# Patient Record
Sex: Male | Born: 1955 | Race: White | Hispanic: No | Marital: Married | Smoking: Former smoker
Health system: Southern US, Community
[De-identification: ages and names within clinical notes are randomized; demographics above are authoritative.]

## PROBLEM LIST (undated history)

## (undated) DIAGNOSIS — R519 Headache, unspecified: Secondary | ICD-10-CM

## (undated) DIAGNOSIS — I2510 Atherosclerotic heart disease of native coronary artery without angina pectoris: Secondary | ICD-10-CM

## (undated) DIAGNOSIS — Z6834 Body mass index (BMI) 34.0-34.9, adult: Secondary | ICD-10-CM

## (undated) DIAGNOSIS — E785 Hyperlipidemia, unspecified: Secondary | ICD-10-CM

## (undated) DIAGNOSIS — I10 Essential (primary) hypertension: Secondary | ICD-10-CM

## (undated) DIAGNOSIS — M1990 Unspecified osteoarthritis, unspecified site: Secondary | ICD-10-CM

## (undated) HISTORY — DX: Hyperlipidemia, unspecified: E78.5

## (undated) HISTORY — DX: Essential (primary) hypertension: I10

## (undated) HISTORY — DX: Headache, unspecified: R51.9

## (undated) HISTORY — PX: OTHER SURGICAL HISTORY: SHX169

---

## 1999-11-15 ENCOUNTER — Encounter: Payer: Self-pay | Admitting: Family Medicine

## 1999-11-15 ENCOUNTER — Encounter: Admission: RE | Admit: 1999-11-15 | Discharge: 1999-11-15 | Payer: Self-pay | Admitting: Family Medicine

## 1999-12-20 ENCOUNTER — Encounter: Admission: RE | Admit: 1999-12-20 | Discharge: 1999-12-20 | Payer: Self-pay | Admitting: Family Medicine

## 1999-12-20 ENCOUNTER — Encounter: Payer: Self-pay | Admitting: Family Medicine

## 2000-07-24 ENCOUNTER — Encounter: Payer: Self-pay | Admitting: Specialist

## 2000-07-24 ENCOUNTER — Ambulatory Visit (HOSPITAL_COMMUNITY): Admission: RE | Admit: 2000-07-24 | Discharge: 2000-07-24 | Payer: Self-pay | Admitting: Specialist

## 2006-09-10 HISTORY — PX: OTHER SURGICAL HISTORY: SHX169

## 2007-07-17 ENCOUNTER — Inpatient Hospital Stay (HOSPITAL_COMMUNITY): Admission: EM | Admit: 2007-07-17 | Discharge: 2007-07-22 | Payer: Self-pay | Admitting: Emergency Medicine

## 2007-07-20 ENCOUNTER — Encounter (INDEPENDENT_AMBULATORY_CARE_PROVIDER_SITE_OTHER): Payer: Self-pay | Admitting: Surgery

## 2008-11-01 IMAGING — CR DG CHEST 1V PORT
1 series · 1 of 1 positions shown · non-contrast
Comparison: none

CLINICAL DATA: Pain. 
PORTABLE CHEST:

[view not recorded]
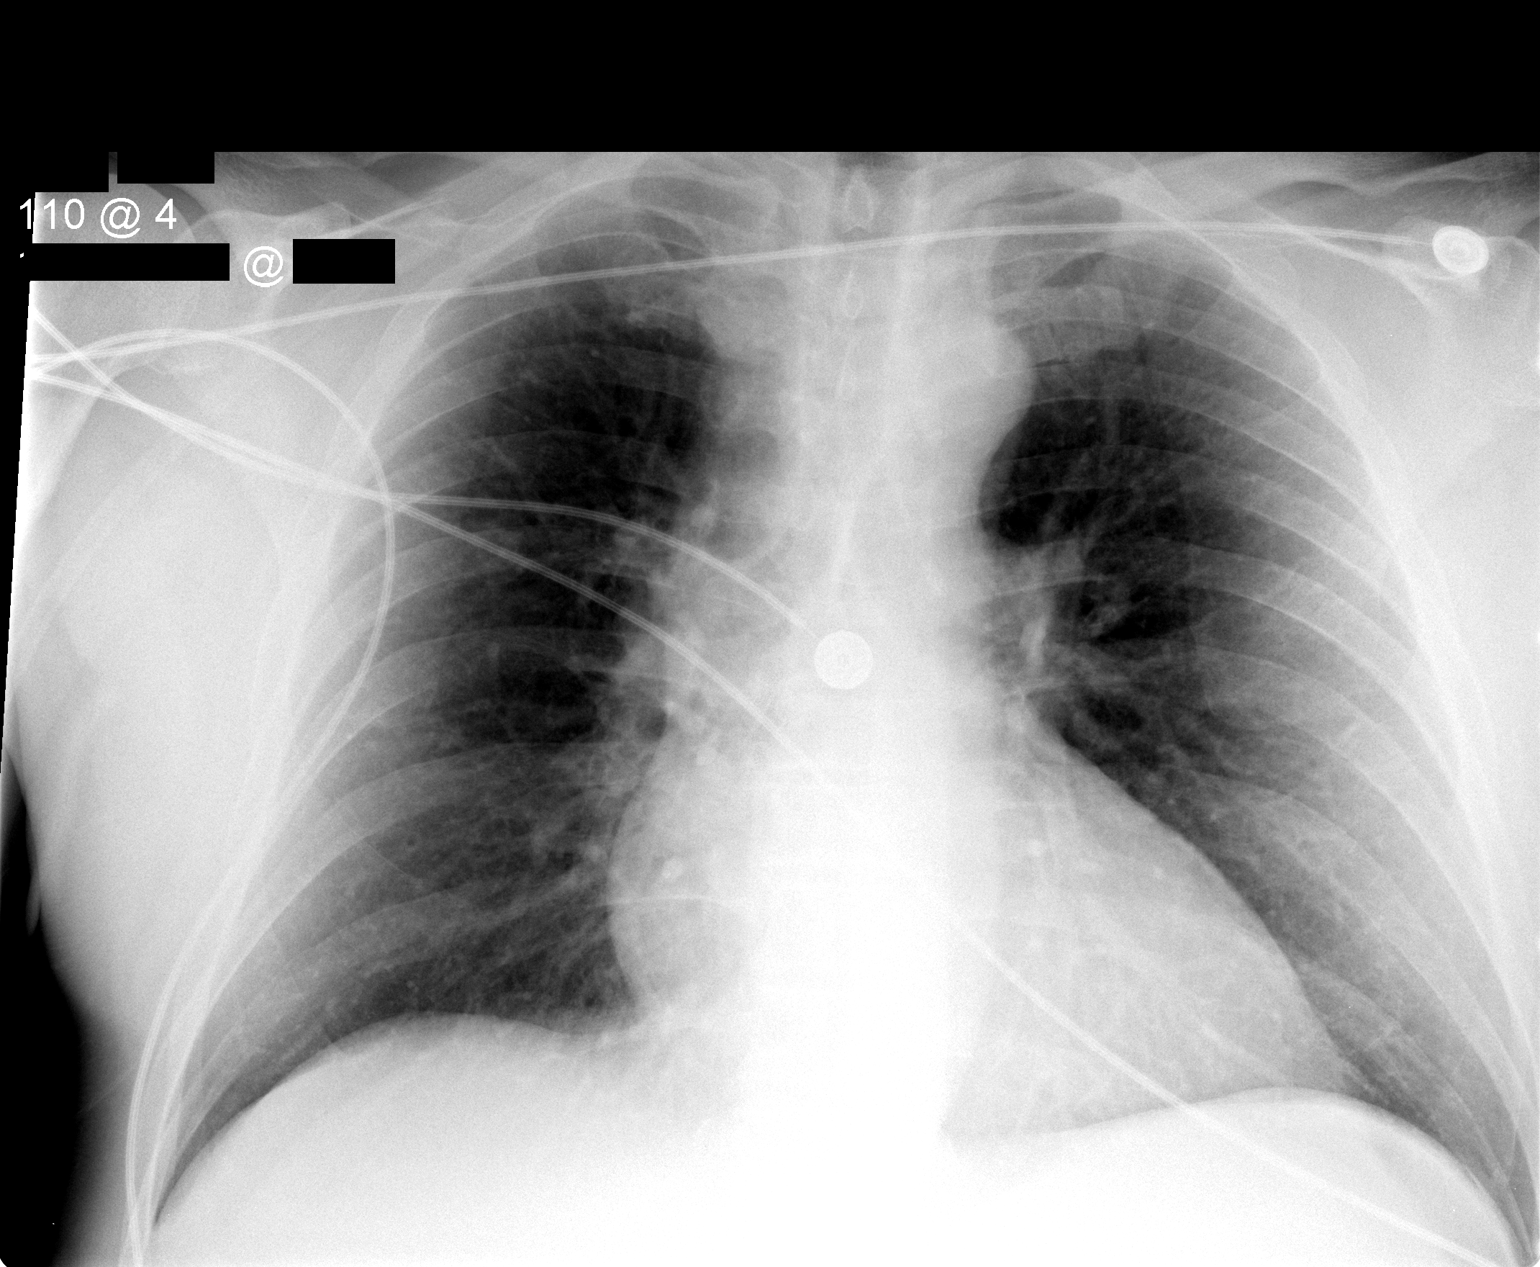

[1 of 1 positions shown; findings below may reference images not displayed]

FINDINGS: Upper limits of normal heart size noted.  The lungs are clear. No pleural effusions or pneumothorax identified. The bony thorax and upper abdomen are within normal limits.
IMPRESSION: No acute abnormalities.

## 2008-11-02 IMAGING — NM NM LIVER FUNCTION STUDY
1 series · 6 of 6 positions shown · non-contrast
Comparison: none

CLINICAL DATA: Abdominal pain.
NUCLEAR MEDICINE HEPATOBILIARY SCAN:
TECHNIQUE: Sequential abdominal images were obtained for approximately 60 minutes following intravenous injection of radiopharmaceutical.
Radiopharmaceutical:  5mCi 1c-11m Choletec and also 1mCi 1c-11m right before IV injection of 4mg of morphine.
Initial gamma camera images up to 76 minutes were acquired revealing normal tracer uptake by the hepatocytes and excretion into the biliary ducts and then passage of contrast into the duodenum however there was no gallbladder activity at that time.  IV morphine was then administered and images were carried [DATE] minutes.  Gallbladder then filled with tracer.

[hida · 2.40mm/px · 6 of 25 frames shown]
[frame 3/25]
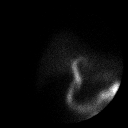
[frame 7/25]
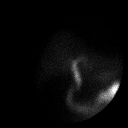
[frame 11/25]
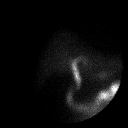
[frame 15/25]
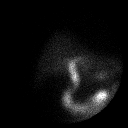
[frame 19/25]
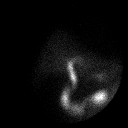
[frame 23/25]
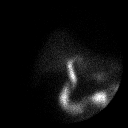

[6 of 6 positions shown; findings below may reference images not displayed]

IMPRESSION: Patency of the cystic and biliary ducts.  The gallbladder did fill with radioisotope after IV morphine.

## 2008-11-03 IMAGING — US US ABDOMEN COMPLETE
1 series · 14 of 25 positions shown · non-contrast
Comparison: none

CLINICAL DATA: Abdominal pain.  
 ABDOMEN ULTRASOUND:
TECHNIQUE: Complete abdominal ultrasound examination was performed including evaluation of the liver, gallbladder, bile ducts, pancreas, kidneys, spleen, IVC, and abdominal aorta.

[Series 1: unknown · 0.34mm/px · 14 of 76 slices shown]
[im 1/76]
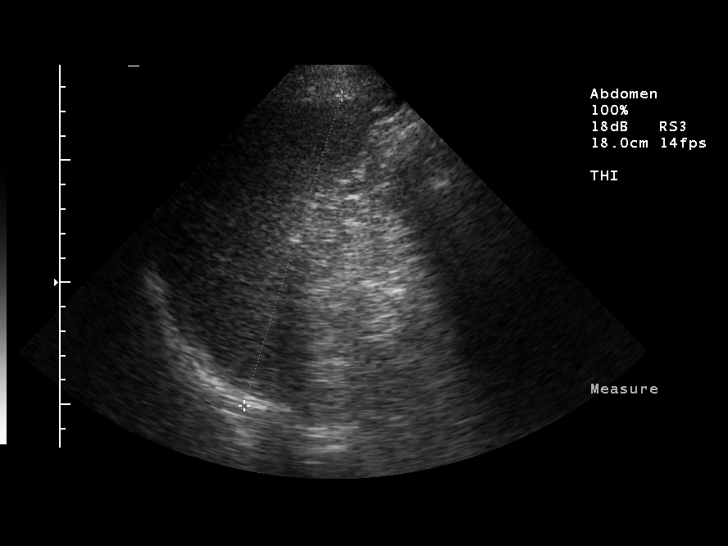
[im 7/76]
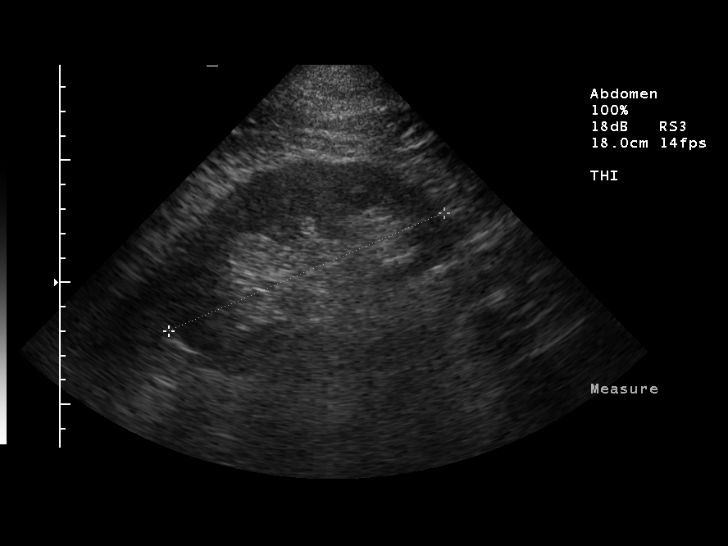
[im 13/76]
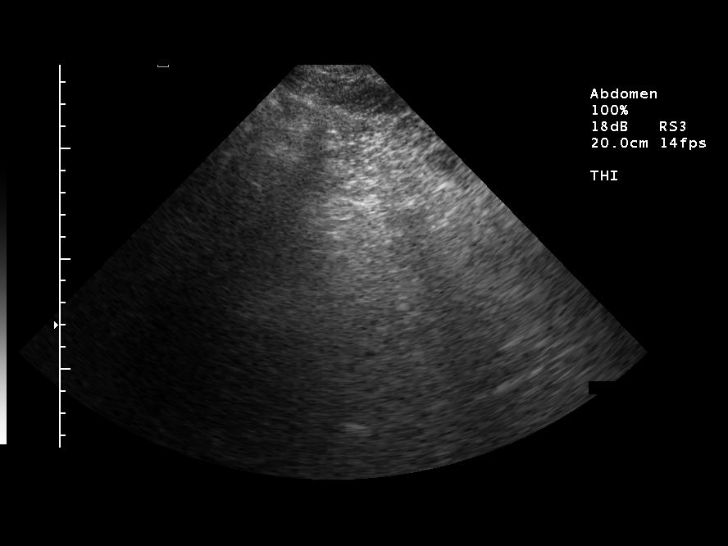
[im 19/76]
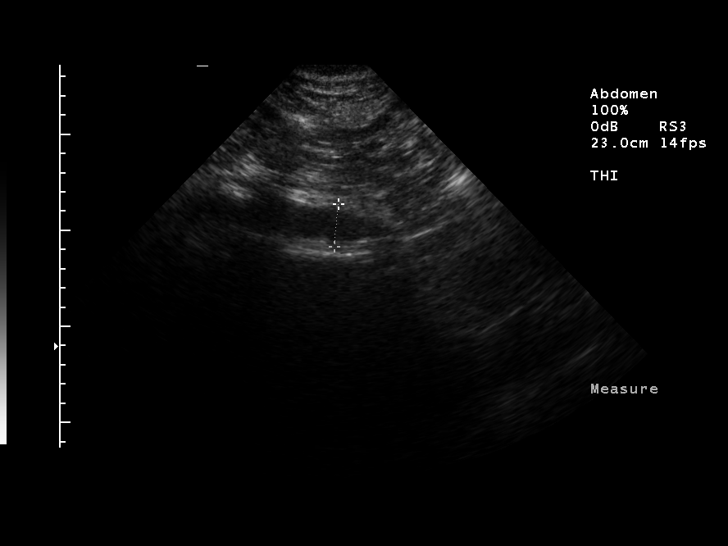
[im 26/76]
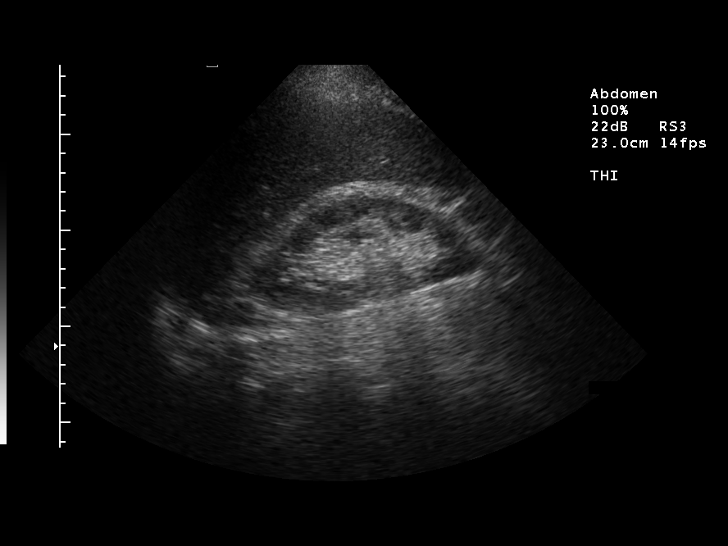
[im 29/76]
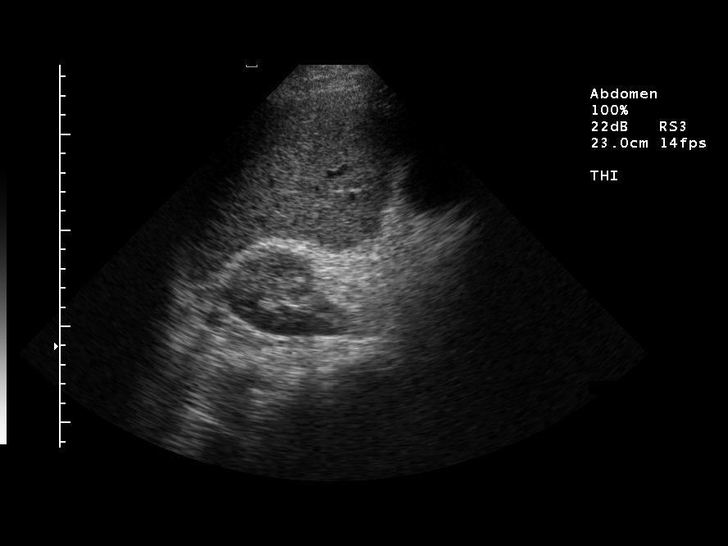
[im 35/76]
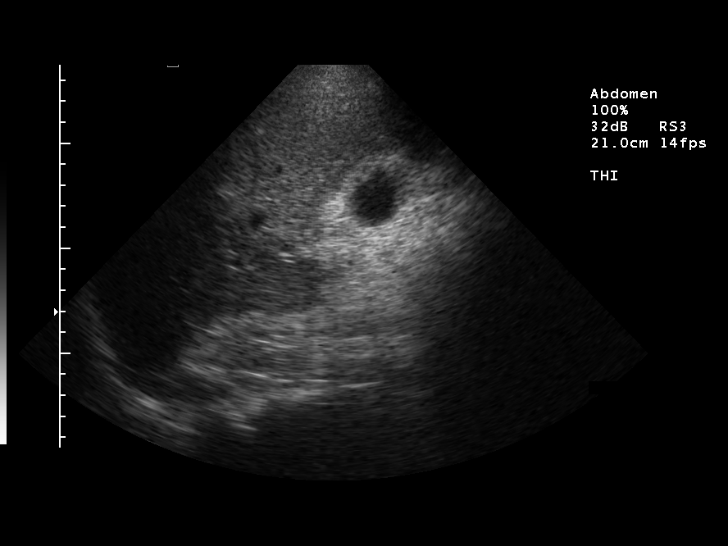
[im 41/76]
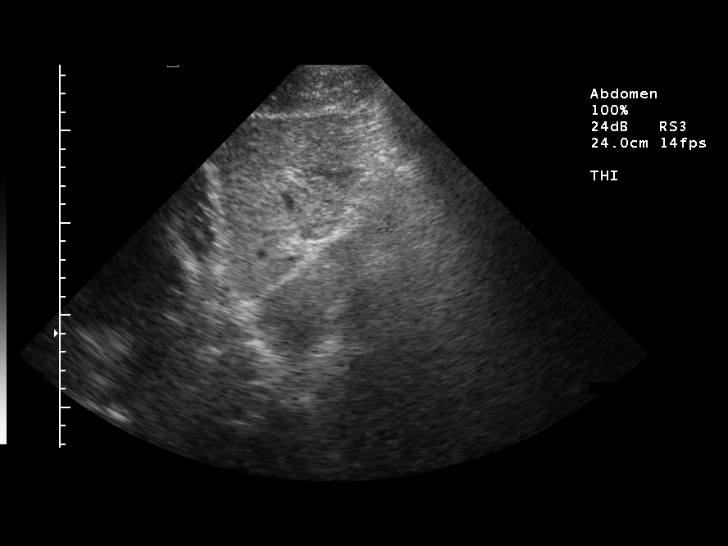
[im 47/76]
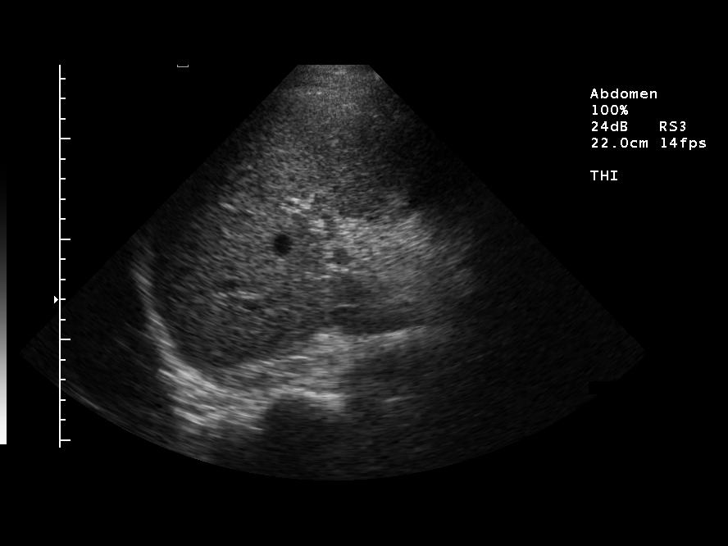
[im 51/76]
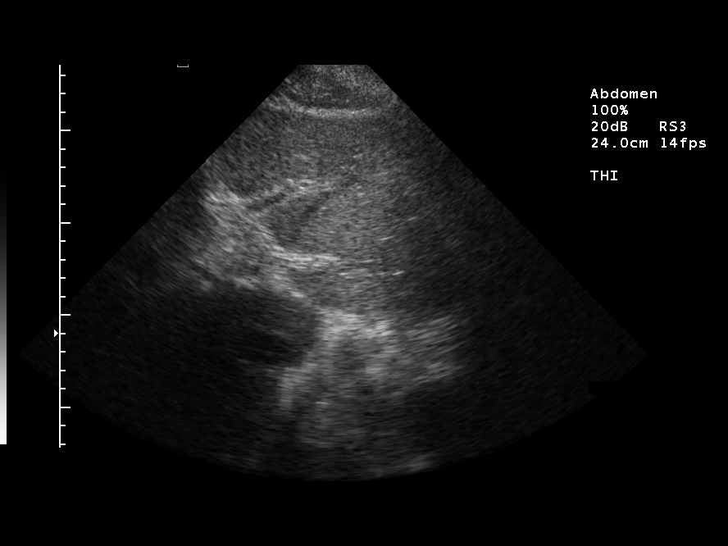
[im 57/76]
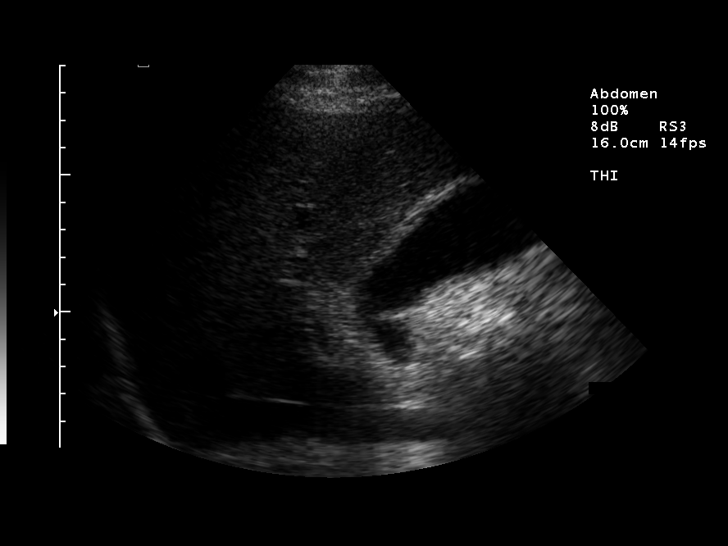
[im 63/76]
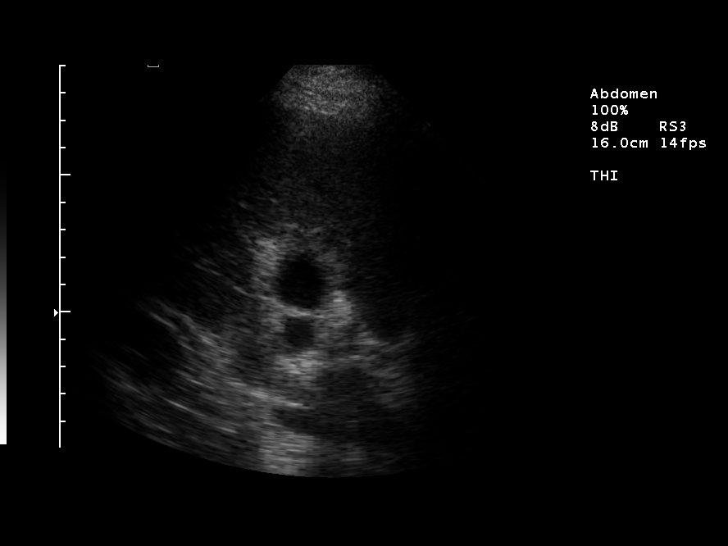
[im 69/76]
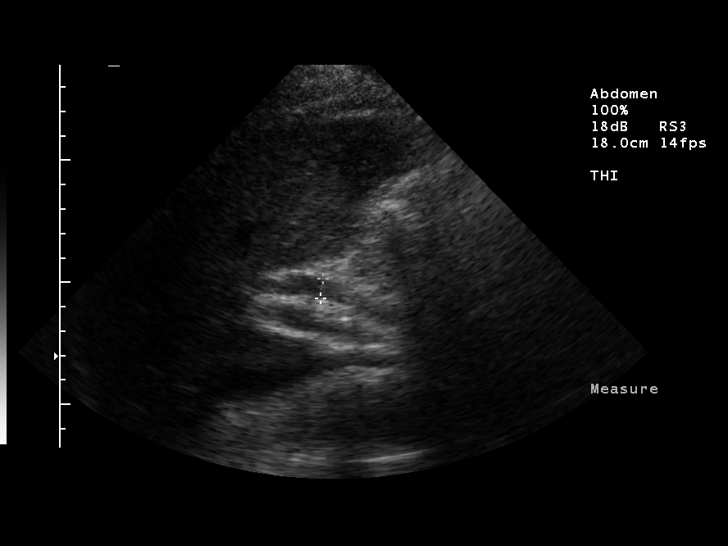
[im 76/76]
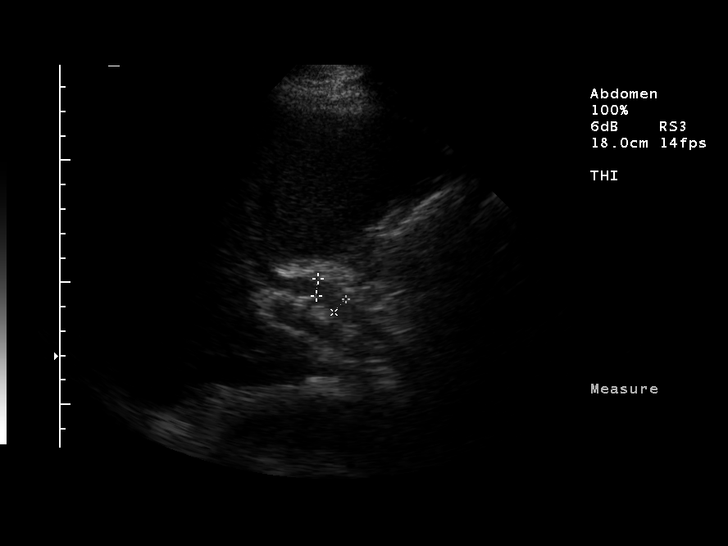

[14 of 25 positions shown; findings below may reference images not displayed]

FINDINGS: There are multiple gallstones.  There is diffuse edema of the gallbladder wall and the patient has a positive sonographic Murphy?s sign.  Common bile duct is dilated to a diameter of 8.1 mm.  There is no discrete visible stone in the common bile duct. No dilated intrahepatic ducts.  
 The liver parenchyma, inferior vena cava, spleen, kidneys and aorta are normal.  The right kidney is 12.7 cm in length and the left kidney is 12.3 cm in length.  Bowel gas obscures the pancreas.
IMPRESSION: Cholelithiasis with edema of the gallbladder wall and positive sonographic Murphy?s sign consistent with acute cholecystitis.

## 2008-11-04 IMAGING — RF DG CHOLANGIOGRAM OPERATIVE
1 series · 4 of 4 positions shown · non-contrast
Comparison: none

CLINICAL DATA: Cholelithiasis

[Series 1: run · 4 of 85 frames shown]
[frame 13/85]
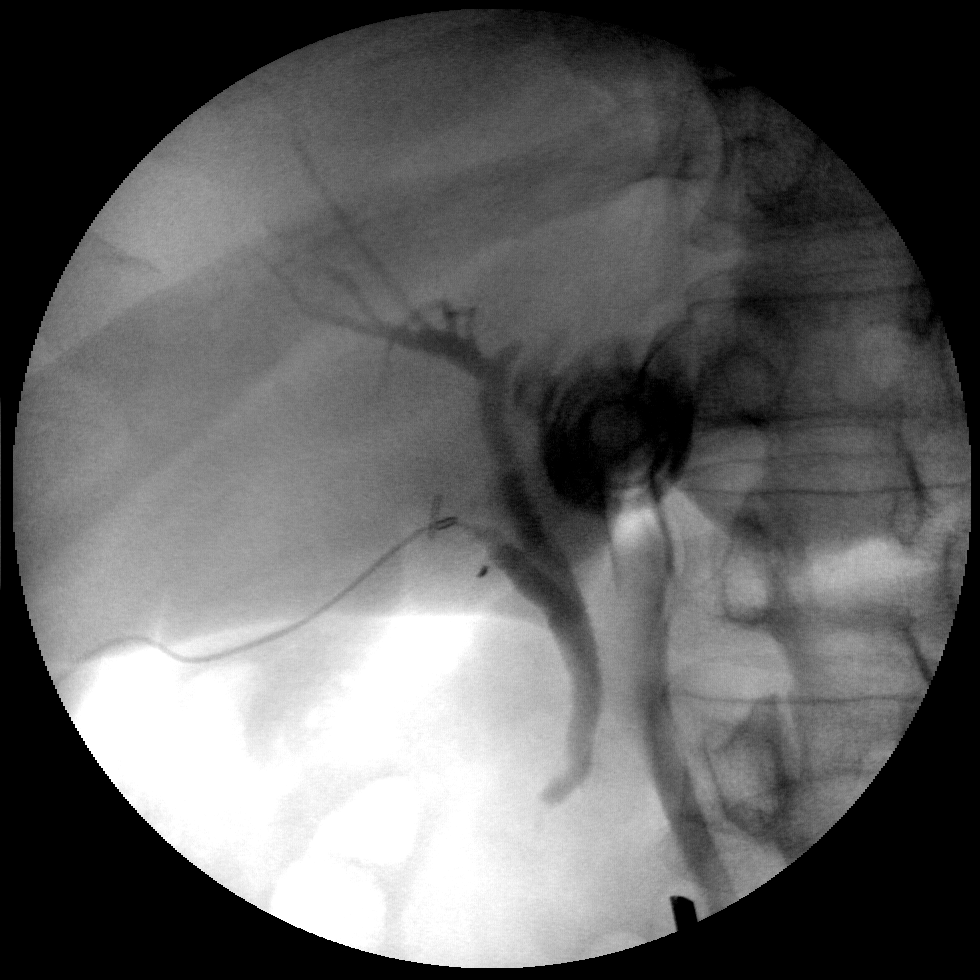
[frame 16/85]
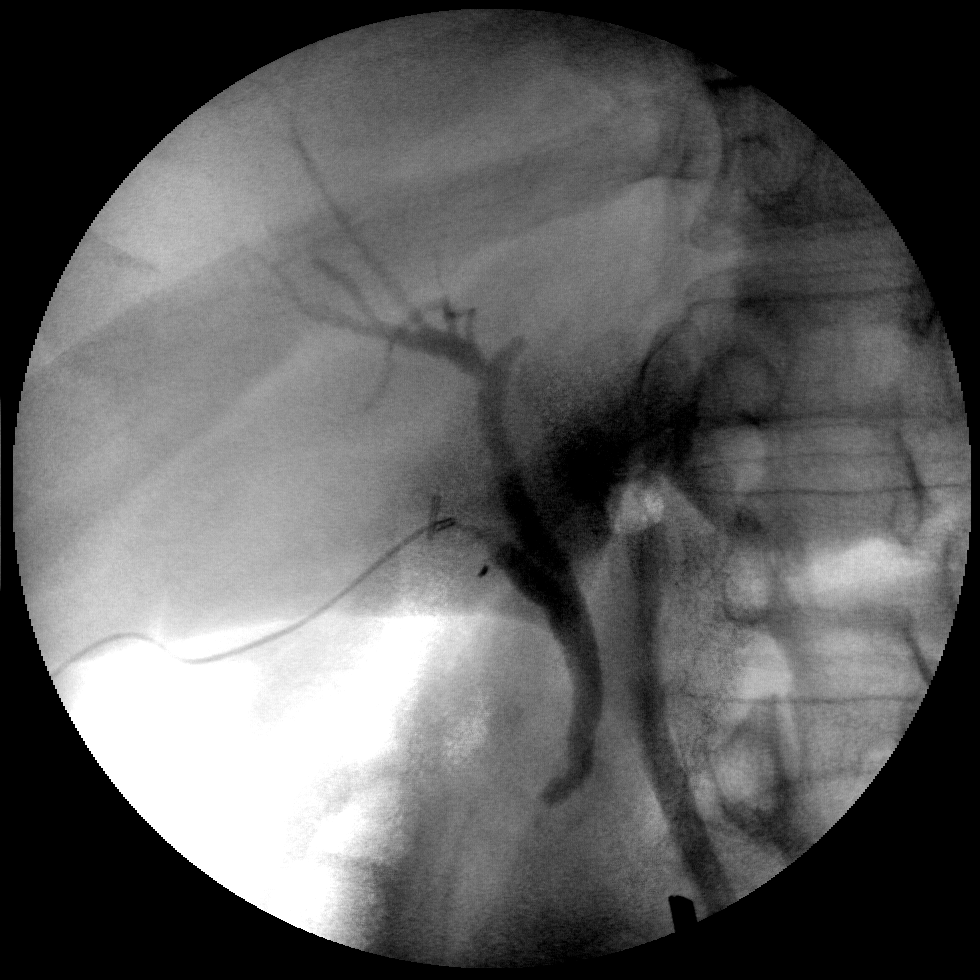
[frame 43/85]
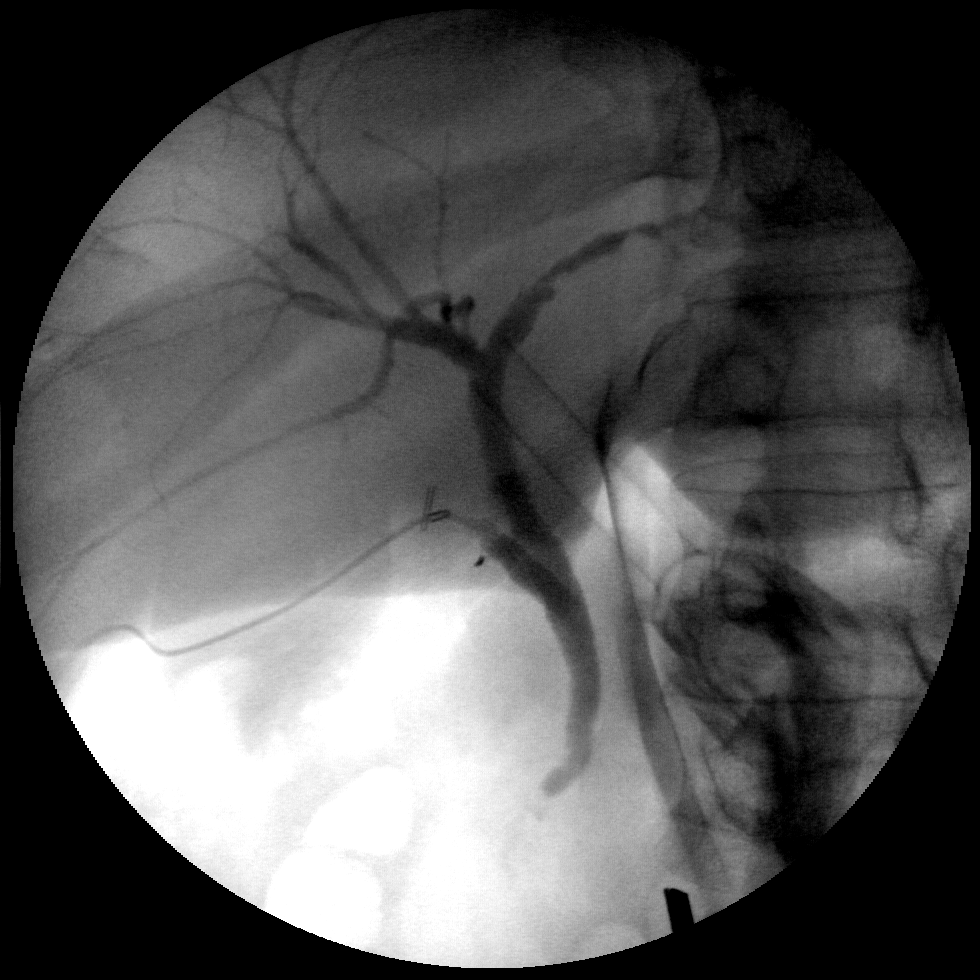
[frame 73/85]
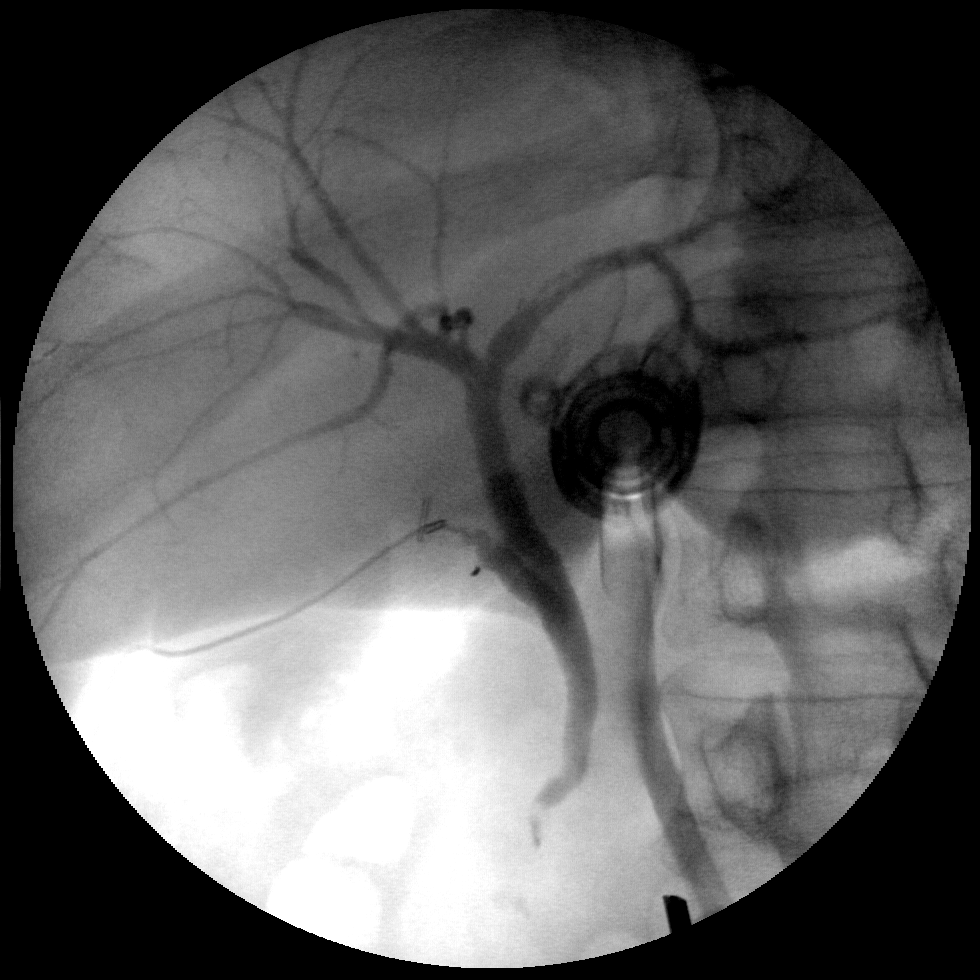

[4 of 4 positions shown; findings below may reference images not displayed]

INTRAOPERATIVE CHOLANGIOGRAM:

85  images  from intraoperative C-arm fluoroscopy demonstrate  opacification of
the common bile duct. Persistent filling defect in the distal CBD. There is
incomplete evaluation of intrahepatic biliary tree, which appears decompressed
centrally. Small amount of appears to flow on into decompressed duodenum.
IMPRESSION: 1. Partially obstructing retained distal CBD stone.

## 2009-08-27 ENCOUNTER — Encounter: Admission: RE | Admit: 2009-08-27 | Discharge: 2009-08-27 | Payer: Self-pay | Admitting: Family Medicine
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
## 2011-01-23 NOTE — Op Note (Signed)
[name], PORTAL              ACCOUNT NO.:  [id number]   MEDICAL RECORD NO.:  [id number]          PATIENT TYPE:  INP   LOCATION:  1528                         FACILITY:  [facility]   PHYSICIAN:  Ardeth Sportsman, MD     DATE OF BIRTH:  11-10-1955   DATE OF PROCEDURE:  07/20/2007  DATE OF DISCHARGE:                               OPERATIVE REPORT   PRIMARY CARE PHYSICIAN:  Robert L. Foy Guadalajara, M.D.   HOSPITALIST:  Michelene Gardener, M.D.   SURGEON:  Ardeth Sportsman, M.D.   ASSISTANT:  None.   PREOPERATIVE DIAGNOSIS:  Acute cholecystitis.   POSTOPERATIVE DIAGNOSIS:  Acute cholecystitis, with early gangrene.   PROCEDURE:  Laparoscopic cholecystectomy with intraoperative  cholangiogram.   ANESTHESIA:  1. General anesthesia.  2. Local anesthesia in a field block around all port sites.   SPECIMENS:  Gallbladder.   DRAINS:  None.   ESTIMATED BLOOD LOSS:  50 mL   COMPLICATIONS:  No major complications.   INDICATIONS FOR PROCEDURE:  The patient is a 55 year old male with  nausea, vomiting, and right upper quadrant abdominal pain that has been  in the hospital and has had persistent pain for 72 hours.  He had a  workup which showed a negative HIDA scan, but an ultrasound that was  worse than his initial admission CT scan.  He had a positive Murphy's  sign.  The anatomy and physiology of hepatobiliary and pancreatic  function was discussed.  Pathophysiology of cholecystitis was discussed.  Options discussed and recommendations made for laparoscopic  cholecystectomy with intraoperative cholangiogram.  Risks such as  stroke, heart attack, deep venous thrombosis, pulmonary embolism, and  death were discussed.  Risks such as bleeding, need for transfusion,  wound infection, abscess, injury to organs, and prolonged pain were  discussed.  Risks of incisional hernia, bile duct injury requiring need  of drainage or stenting or reconstruction and other risks were  discussed.  Questions answered  and agreed to proceed.   FINDINGS:  A very thickened gallbladder with about three patches of  bilious staining concerning for early gangrene.  There was no empyema of  the gallbladder.  Cholangiogram showed some distal stenosis around the  ampulla, but no definite filling defects to suggest choledocholithiasis.   DESCRIPTION OF PROCEDURE:  Informed consent was confirmed.  The patient  underwent general anesthesia without difficulty.  He had just received  IV Zosyn within an hour of surgery.  He had central compression devices  activated throughout the entire case.  He underwent general anesthesia  without difficulty.  He was supine with both arms tucked.  He had a  Foley catheter sterilely placed.  His abdomen was prepped and draped in  the usual sterile fashion.   Entry was gained into the abdomen with the patient in deep reverse  Trendelenburg right side up with an optical entry technique to place a 5  mm port in the right upper quadrant.  Camera inspection revealed no  intra-abdominal injury.  Under direct visualization 5 mm ports was  placed through the umbilicus and through the right flank.  A  10 mm port  was tunneled through the falciform ligament in the subxiphoid region.   Findings were as noted above.  There were some mild omental adhesions  and these were easily freed off bluntly.  The gallbladder was grasped  and elevated cephalad.  During this process, there was a tear and  spillage of thickened bile, but no empyema of the gallbladder.  The  peritoneal coverings to the anteromedial and posterolateral aspects of  the gallbladder were freed off.  Circumferential dissection was done  around the ampulla of the gallbladder.  There was a tubular pulsatile  structure going from the porta hepatis going up to the gallbladder.  This seemed consistent with the cystic artery.  The main branch seemed  to be actually going more toward the posterolateral aspect.  This  dissection there  was some bleeding.  A clip was placed on it with good  hemostasis.  Further visualization was done noting only one more  significant structure going from the infundibulum of the gallbladder  down to the porta hepatis consisting of cystic duct.  One clip on the  gallbladder side was made and a partial cyst ductotomy was performed  with release of bile.   A 5 [place] cholangiocatheter was placed through a subxiphoid stab  incision, flushed, and passed through the cystic duct easily.  Cholangiogram was run using diluted radio-opaque contrast and continuous  fluoroscopy.  Contrast fluid from a side branch canalization consistent  with cystic duct canalization.  Contrast flowed well into the right and  left intrahepatic chains.  There was a little bit of bubbles and sludge  in the left intrahepatic aspect, but consistent with bubbles.  Contrast  ultimately did go down into the common bile duct and did go across what  appeared to be a thickened ampulla.  There was no definite filling  defect.  Contrast could see wistfully flowing down into the duodenum.  This is consistent with a normal cholangiogram.  The cholangiocatheter  was removed.  Two clips were made on the cystic duct stump.  The cystic  duct was rather dilated, so I placed a 0 Endoloop around the stump  slightly proximal, taking care to avoid getting too close to the cystic  duct/common bile duct junction.   The gallbladder was freed from its remaining attachments to the liver  bed.  There were a couple of side branches that were carefully  skeletonized and clipped.  The gallbladder was rather intrahepatic and  did breach into the posterior wall of the gallbladder with some spillage  of bile, however, there was no significant spillage of stones.  The  gallbladder was freed off the liver bed.  It was placed in the EndoCatch  bag and brought through the subxiphoid port.  The first bag burst and  there was spillage of one stone on the  falciform ligament.  This was  grasped and removed.  The gallbladder was placed in a new bag and the  incision was opened up a little bit more and it came out more easily.  A  0 Vicryl stitch was placed in a figure-of-eight fashion around the  subxiphoid defect using laparoscopic suture passer under direct  visualization.   Copious irrigation was done on the liver bed and round the right upper  quadrant.  There was some oozing on the liver bed, but this was  controlled with cautery.  Meticulous hemostasis was ensured.  Careful  inspection was made on the cystic duct and arterial  stumps and there was  leak of bile or blood.  The duodenum, transverse colon, and other areas  in the region were inspected and were not injured.   Over 3 liters of irrigation was made with very clear return at the end  and final inspection revealed excellent hemostasis of the liver bed.  The upper three abdominal ports were removed and no bleeding in the  peritoneum was sited.  The peritoneum was actively evacuated and the  port was removed.  The subxiphoid fascial stitch was tied down.  Subxiphoid port was irrigated with a final liter of saline with clear  return.  The skin was closed using 4-0 Monocryl stitch in all port  sites.  Sterile dressing was applied.  The patient was extubated and  taken to the recovery room in stable condition.   I explained the operative findings to the patient's wife and sister.  Postoperative instructions were discussed and they expressed  understanding and appreciation.      Ardeth Sportsman, MD  Electronically Signed     SCG/MEDQ  D:  07/20/2007  T:  07/20/2007  Job:  [id number]   cc:   Molly Maduro L. Foy Guadalajara, M.D.  Fax: [phone]

## 2011-01-23 NOTE — H&P (Signed)
[name], [name]              ACCOUNT NO.:  [id number]   MEDICAL RECORD NO.:  [id number]          PATIENT TYPE:  EMS   LOCATION:  ED                           FACILITY:  [facility]   PHYSICIAN:  [name], M.D.DATE OF BIRTH:  Dec 31, 1955   DATE OF ADMISSION:  07/17/2007  DATE OF DISCHARGE:                              HISTORY & PHYSICAL   PRIMARY CARE PHYSICIAN:  [name], M.D.   CHIEF COMPLAINT:  Abdominal pain.   HISTORY OF PRESENT ILLNESS:  The patient is a 55 year old white male  with no past medical history, who has been in previously good health  with no problems, no diarrhea, no nausea and vomiting until today.  This  evening he was previously feeling fine.  He ate some taco salad  approximately at 5 p.m.  Two hours later he started having some  abdominal pain in his right upper quadrant.  Initially it was rated  around 5/10 but then began to progress more severely.  He ended up  having some severe nausea and vomiting with this.  This continued to  persist to the point where he could not take anymore and he came into  the emergency room for further evaluation.  In the emergency room, he  continued to have nausea and vomiting, and right upper quadrant  abdominal pain.  Despite multiple doses of IV pain medication, his pain  came down to about a 5 which it is at currently.  His nausea and  vomiting has since ceased.  Labs were ordered on the patient and he was  found to have a normal white count, no shift, normal liver function  tests, normal lipase, and normal cardiac markers as well as a normal D-  dimer.  The patient underwent a CT of the abdomen and pelvis without  contrast, however, the radiologist spoke with the ER attending and  assured that she was able to see all of the organs and everything looked  completely normal.  No evidence of bowel obstruction, no evidence of any  liver disease.  Gallbladder looked normal.  Currently the patient  complains of  some right upper quadrant soreness.  He feels quite  fatigued.  He feels mildly nauseated, but no headache, vision changes,  dysphagia.  No chest pain, palpitations.  No shortness of breath,  wheeze, or cough.  He complains of some continued right upper quadrant  pain and soreness.  No hematuria or dysuria.  No constipation and no  diarrhea.  The patient states with the nausea and vomiting this did  improve some of his symptoms briefly.  No focal extremity numbness,  weakness, or pain.   REVIEW OF SYSTEMS:  Otherwise negative.   PAST MEDICAL HISTORY:  None.   MEDICATIONS:  Aspirin p.r.n. but never with any frequency.   ALLERGIES:  No known drug allergies.   SOCIAL HISTORY:  He smokes about two packs a day.  No alcohol or drug  use.   FAMILY HISTORY:  Noncontributory.   PHYSICAL EXAMINATION:  VITAL SIGNS:  Temperature 97, heart rate 107 now  down to 70, blood pressure  initially 215/94 now down to 193/108,  respirations 26, O2 saturation 99% on room air.  GENERAL:  He is alert and oriented x3 in no acute distress.  HEENT:  Normocephalic and atraumatic.  Mucous membranes are slightly  dry.  No carotid bruits.  HEART:  Regular rate and rhythm, S1 and S2.  LUNGS:  Clear to auscultation bilaterally.  He has no flank pain.  ABDOMEN:  Soft, distended with some pain and tenderness in the right  upper quadrant.  Hypoactive bowel sounds.  EXTREMITIES:  No cyanosis, clubbing, or edema.   LABORATORY DATA:  White count 8, H&H 15.8 and 45, MCV 94, platelet count  221 with no shift.  Lipase 32.  CPK 60, MB 1.4, troponin I less than  0.05, D-dimer 0.33.  Sodium 139, potassium 3.5, chloride 105, bicarb 24,  BUN 10, creatinine 1.19, glucose 152.  LFT's are unremarkable.   ASSESSMENT:  1. Abdominal pain right upper quadrant.  Unclear if this is      gallbladder related or perhaps this is a viral illness.  It is      possible that he could have had simple food poisoning.  We will      plan to  start with hydrating the patient and make him NPO.  Put him      on IV pain and nausea medicines plus IV Protonix.  We will recheck      labs including CBC, CMET, and cardiac markers in the morning.  We      will also check a HIDA scan given the fact that he is having      markedly focal pain to completely rule out his gallbladder.  2. Tobacco abuse.  Nicotine patch.  3. Hypertension.  Put the patient on IV Lopressor p.r.n.  It is      possible that he may have undiagnosed hypertension.      [name], M.D.  Electronically Signed     SKK/MEDQ  D:  07/17/2007  T:  07/17/2007  Job:  [id number]   cc:   Molly Maduro L. Foy Moreno, M.D.  Fax: [phone]

## 2011-01-26 NOTE — Discharge Summary (Signed)
[name], [name]              ACCOUNT NO.:  [id number]   MEDICAL RECORD NO.:  [id number]          PATIENT TYPE:  INP   LOCATION:  1528                         FACILITY:  [facility]   PHYSICIAN:  Michelene Gardener, MD    DATE OF BIRTH:  06/27/1956   DATE OF ADMISSION:  07/17/2007  DATE OF DISCHARGE:  07/22/2007                               DISCHARGE SUMMARY   PRIMARY PHYSICIAN:  Dr. Marinda Elk.   DISCHARGE DIAGNOSES:  1. Acute cholecystitis.  2. Cholelithiasis.  3. Status post cholecystectomy.   DISCHARGE MEDICATIONS:  1. Augmentin 875 mg p.o. twice daily times 5 days.  2. Percocet 1-2 tablets q.4 h as needed.  3. Nicotine patch.   CONSULTATIONS:  Surgical consult done by Dr. Karie Soda in July 20, 2007.   PROCEDURES:  Status post laparoscopic cholecystectomy with intraoperative  cholangiogram done by Dr. Michaell Cowing in November 9.   FOLLOW-UP APPOINTMENTS:  1. Dr. Elias Else in one week.  2. Dr. Karie Soda in 1-2 weeks.   RADIOLOGY STUDIES:  1. CT scan of the abdomen showed no evidence of acute finding.  2. CT scan of the pelvis showed no evidence of acute problem.  3. HIDA scan done on November 7 showed patency of the cystic and      biliary ducts.  4. Ultrasound of the abdomen showed cholelithiasis with edema of the      gallbladder and positive sonographic Murphy's sign which is      consistent with acute cholecystitis.   COURSE OF HOSPITALIZATION:  This is a 55 year old Caucasian male with no  significant past medical history presented to the hospital on November 6  complaining of right upper quadrant pain.  The patient was admitted to  the hospital for further evaluation.  CT scan of the abdomen was done  and came to be normal.  CT scan of the pelvis was done and came to be  normal.  HIDA scan was done and showed no evidence of cholecystitis.  Ultrasound of the abdomen showed positive cholelithiasis with findings  questionable for acute cholecystitis.  The  patient was kept n.p.o., was  given IV fluids and IV pain medications.  After the results of the  ultrasound, the patient was started on Zosyn IV. Surgical consultation  was done by Dr. Michaell Cowing who took the patient for laparoscopic  cholecystectomy.  Following procedure his LFTs elevated and then came  back to normal.  AT the time of discharge, the patient was  given Augmentin and pain medication.  He was advised to follow with his  primary physician and to follow with Dr. Michaell Cowing as an outpatient.   ASSESSMENT TIME:  Forty minutes.      Michelene Gardener, MD  Electronically Signed     NAE/MEDQ  D:  08/01/2007  T:  08/02/2007  Job:  [id number]   cc:   Molly Maduro L. Foy [place], M.D.  Fax: [phone]

## 2011-06-19 LAB — DIFFERENTIAL
Basophils Absolute: 0.1
Basophils Relative: 2 — ABNORMAL HIGH
Eosinophils Absolute: 0.1
Eosinophils Relative: 2
Lymphocytes Relative: 39
Lymphs Abs: 3.1
Monocytes Absolute: 0.6
Monocytes Relative: 7
Neutro Abs: 4.1
Neutrophils Relative %: 51

## 2011-06-19 LAB — HEPATIC FUNCTION PANEL
ALT: 125 — ABNORMAL HIGH
AST: 107 — ABNORMAL HIGH
Albumin: 3.1 — ABNORMAL LOW
Alkaline Phosphatase: 110
Bilirubin, Direct: 0.4 — ABNORMAL HIGH
Indirect Bilirubin: 1 — ABNORMAL HIGH
Total Bilirubin: 1.4 — ABNORMAL HIGH
Total Protein: 6

## 2011-06-19 LAB — COMPREHENSIVE METABOLIC PANEL
ALT: 102 — ABNORMAL HIGH
ALT: 17
ALT: 19
ALT: 52
AST: 17
AST: 21
AST: 47 — ABNORMAL HIGH
AST: 49 — ABNORMAL HIGH
Albumin: 3.2 — ABNORMAL LOW
Albumin: 3.6
Albumin: 3.7
Albumin: 4.2
Alkaline Phosphatase: 108
Alkaline Phosphatase: 64
Alkaline Phosphatase: 76
Alkaline Phosphatase: 77
BUN: 10
BUN: 11
BUN: 3 — ABNORMAL LOW
BUN: 5 — ABNORMAL LOW
CO2: 24
CO2: 27
CO2: 27
CO2: 28
Calcium: 8.7
Calcium: 8.8
Calcium: 8.9
Calcium: 9.7
Chloride: 104
Chloride: 105
Chloride: 105
Chloride: 107
Creatinine, Ser: 0.91
Creatinine, Ser: 0.99
Creatinine, Ser: 1.09
Creatinine, Ser: 1.19
GFR calc Af Amer: >60
GFR calc Af Amer: >60
GFR calc Af Amer: >60
GFR calc Af Amer: >60
GFR calc non Af Amer: >60
GFR calc non Af Amer: >60
GFR calc non Af Amer: >60
GFR calc non Af Amer: >60
Glucose, Bld: 101 — ABNORMAL HIGH
Glucose, Bld: 102 — ABNORMAL HIGH
Glucose, Bld: 144 — ABNORMAL HIGH
Glucose, Bld: 152 — ABNORMAL HIGH
Potassium: 3.5
Potassium: 3.8
Potassium: 4.1
Potassium: 4.1
Sodium: 137
Sodium: 139
Sodium: 140
Sodium: 141
Total Bilirubin: 0.6
Total Bilirubin: 0.7
Total Bilirubin: 1.3 — ABNORMAL HIGH
Total Bilirubin: 1.3 — ABNORMAL HIGH
Total Protein: 6.3
Total Protein: 6.3
Total Protein: 6.3
Total Protein: 6.9

## 2011-06-19 LAB — CARDIAC PANEL(CRET KIN+CKTOT+MB+TROPI)
CK, MB: 1.6
Relative Index: INVALID
Total CK: 80
Troponin I: <0.01

## 2011-06-19 LAB — POCT CARDIAC MARKERS
CKMB, poc: 1.4
Myoglobin, poc: 60
Operator id: 1192
Troponin i, poc: <0.05

## 2011-06-19 LAB — CBC
HCT: 39.3
HCT: 40.7
HCT: 45.4
Hemoglobin: 13.8
Hemoglobin: 14.4
Hemoglobin: 15.8
MCHC: 34.9
MCHC: 35.1
MCHC: 35.3
MCV: 94.2
MCV: 95.5
MCV: 95.5
Platelets: 166
Platelets: 169
Platelets: 221
RBC: 4.11 — ABNORMAL LOW
RBC: 4.26
RBC: 4.81
RDW: 12.2
RDW: 13
RDW: 13
WBC: 11.2 — ABNORMAL HIGH
WBC: 8
WBC: 9.2

## 2011-06-19 LAB — LIPASE, BLOOD
Lipase: 18
Lipase: 32

## 2011-06-19 LAB — CREATININE, SERUM
Creatinine, Ser: 1.02
GFR calc Af Amer: >60
GFR calc non Af Amer: >60

## 2011-06-19 LAB — D-DIMER, QUANTITATIVE: D-Dimer, Quant: 0.33

## 2011-07-20 ENCOUNTER — Other Ambulatory Visit: Payer: Self-pay | Admitting: Family Medicine

## 2014-12-17 ENCOUNTER — Other Ambulatory Visit: Payer: Self-pay | Admitting: Gastroenterology

## 2016-09-10 HISTORY — PX: CYST REMOVAL HAND: SHX6279

## 2017-02-22 DIAGNOSIS — E782 Mixed hyperlipidemia: Secondary | ICD-10-CM | POA: Diagnosis not present

## 2017-02-22 DIAGNOSIS — I10 Essential (primary) hypertension: Secondary | ICD-10-CM | POA: Diagnosis not present

## 2017-02-22 DIAGNOSIS — Z23 Encounter for immunization: Secondary | ICD-10-CM | POA: Diagnosis not present

## 2017-03-01 DIAGNOSIS — L820 Inflamed seborrheic keratosis: Secondary | ICD-10-CM | POA: Diagnosis not present

## 2017-03-01 DIAGNOSIS — C44529 Squamous cell carcinoma of skin of other part of trunk: Secondary | ICD-10-CM | POA: Diagnosis not present

## 2017-03-08 DIAGNOSIS — M67441 Ganglion, right hand: Secondary | ICD-10-CM | POA: Diagnosis not present

## 2017-03-22 DIAGNOSIS — L249 Irritant contact dermatitis, unspecified cause: Secondary | ICD-10-CM | POA: Diagnosis not present

## 2017-03-22 DIAGNOSIS — L814 Other melanin hyperpigmentation: Secondary | ICD-10-CM | POA: Diagnosis not present

## 2017-03-22 DIAGNOSIS — L570 Actinic keratosis: Secondary | ICD-10-CM | POA: Diagnosis not present

## 2017-03-22 DIAGNOSIS — D225 Melanocytic nevi of trunk: Secondary | ICD-10-CM | POA: Diagnosis not present

## 2017-03-22 DIAGNOSIS — B078 Other viral warts: Secondary | ICD-10-CM | POA: Diagnosis not present

## 2017-04-03 DIAGNOSIS — M67441 Ganglion, right hand: Secondary | ICD-10-CM | POA: Diagnosis not present

## 2017-04-03 DIAGNOSIS — M24041 Loose body in right finger joint(s): Secondary | ICD-10-CM | POA: Diagnosis not present

## 2017-04-03 DIAGNOSIS — M71341 Other bursal cyst, right hand: Secondary | ICD-10-CM | POA: Diagnosis not present

## 2017-04-19 DIAGNOSIS — M67441 Ganglion, right hand: Secondary | ICD-10-CM | POA: Diagnosis not present

## 2017-07-26 DIAGNOSIS — Z0000 Encounter for general adult medical examination without abnormal findings: Secondary | ICD-10-CM | POA: Diagnosis not present

## 2017-07-26 DIAGNOSIS — E782 Mixed hyperlipidemia: Secondary | ICD-10-CM | POA: Diagnosis not present

## 2017-07-26 DIAGNOSIS — I10 Essential (primary) hypertension: Secondary | ICD-10-CM | POA: Diagnosis not present

## 2017-09-29 DIAGNOSIS — J028 Acute pharyngitis due to other specified organisms: Secondary | ICD-10-CM | POA: Diagnosis not present

## 2017-09-29 DIAGNOSIS — J069 Acute upper respiratory infection, unspecified: Secondary | ICD-10-CM | POA: Diagnosis not present

## 2017-12-06 DIAGNOSIS — L820 Inflamed seborrheic keratosis: Secondary | ICD-10-CM | POA: Diagnosis not present

## 2017-12-06 DIAGNOSIS — L578 Other skin changes due to chronic exposure to nonionizing radiation: Secondary | ICD-10-CM | POA: Diagnosis not present

## 2017-12-06 DIAGNOSIS — L821 Other seborrheic keratosis: Secondary | ICD-10-CM | POA: Diagnosis not present

## 2017-12-06 DIAGNOSIS — L570 Actinic keratosis: Secondary | ICD-10-CM | POA: Diagnosis not present

## 2017-12-06 DIAGNOSIS — D225 Melanocytic nevi of trunk: Secondary | ICD-10-CM | POA: Diagnosis not present

## 2018-02-14 DIAGNOSIS — R011 Cardiac murmur, unspecified: Secondary | ICD-10-CM | POA: Diagnosis not present

## 2018-02-14 DIAGNOSIS — M79672 Pain in left foot: Secondary | ICD-10-CM | POA: Diagnosis not present

## 2018-04-04 DIAGNOSIS — R011 Cardiac murmur, unspecified: Secondary | ICD-10-CM | POA: Diagnosis not present

## 2018-07-10 DIAGNOSIS — I10 Essential (primary) hypertension: Secondary | ICD-10-CM | POA: Diagnosis not present

## 2018-07-10 DIAGNOSIS — M79672 Pain in left foot: Secondary | ICD-10-CM | POA: Diagnosis not present

## 2018-07-18 DIAGNOSIS — L570 Actinic keratosis: Secondary | ICD-10-CM | POA: Diagnosis not present

## 2018-07-24 ENCOUNTER — Ambulatory Visit (INDEPENDENT_AMBULATORY_CARE_PROVIDER_SITE_OTHER): Payer: 59

## 2018-07-24 ENCOUNTER — Other Ambulatory Visit: Payer: Self-pay | Admitting: Podiatry

## 2018-07-24 ENCOUNTER — Ambulatory Visit: Payer: 59 | Admitting: Podiatry

## 2018-07-24 ENCOUNTER — Encounter: Payer: Self-pay | Admitting: Podiatry

## 2018-07-24 VITALS — BP 128/88 | HR 78 | Resp 16

## 2018-07-24 DIAGNOSIS — M7752 Other enthesopathy of left foot: Secondary | ICD-10-CM

## 2018-07-24 DIAGNOSIS — M79672 Pain in left foot: Secondary | ICD-10-CM

## 2018-07-24 DIAGNOSIS — M2042 Other hammer toe(s) (acquired), left foot: Secondary | ICD-10-CM

## 2018-07-24 DIAGNOSIS — M779 Enthesopathy, unspecified: Secondary | ICD-10-CM

## 2018-07-24 MED ORDER — TRIAMCINOLONE ACETONIDE 10 MG/ML IJ SUSP
10.0000 mg | Freq: Once | INTRAMUSCULAR | Status: AC
  Administered 2018-07-24: 10 mg

## 2018-07-24 NOTE — Patient Instructions (Signed)
Hammer Toe Hammer toe is a change in the shape (a deformity) of your second, third, or fourth toe. The deformity causes the middle joint of your toe to stay bent. This causes pain, especially when you are wearing shoes. Hammer toe starts gradually. At first, the toe can be straightened. Gradually over time, the deformity becomes stiff and permanent. Early treatments to keep the toe straight may relieve pain. As the deformity becomes stiff and permanent, surgery may be needed to straighten the toe. What are the causes? Hammer toe is caused by abnormal bending of the toe joint that is closest to your foot. It happens gradually over time. This pulls on the muscles and connections (tendons) of the toe joint, making them weak and stiff. It is often related to wearing shoes that are too short or narrow and do not let your toes straighten. What increases the risk? You may be at greater risk for hammer toe if you:  Are male.  Are older.  Wear shoes that are too small.  Wear high-heeled shoes that pinch your toes.  Are a ballet dancer.  Have a second toe that is longer than your big toe (first toe).  Injure your foot or toe.  Have arthritis.  Have a family history of hammer toe.  Have a nerve or muscle disorder.  What are the signs or symptoms? The main symptoms of this condition are pain and deformity of the toe. The pain is worse when wearing shoes, walking, or running. Other symptoms may include:  Corns or calluses over the bent part of the toe or between the toes.  Redness and a burning feeling on the toe.  An open sore that forms on the top of the toe.  Not being able to straighten the toe.  How is this diagnosed? This condition is diagnosed based on your symptoms and a physical exam. During the exam, your health care provider will try to straighten your toe to see how stiff the deformity is. You may also have tests, such as:  A blood test to check for rheumatoid  arthritis.  An X-ray to show how severe the deformity is.  How is this treated? Treatment for this condition will depend on how stiff the deformity is. Surgery is often needed. However, sometimes a hammer toe can be straightened without surgery. Treatments that do not involve surgery include:  Taping the toe into a straightened position.  Using pads and cushions to protect the toe (orthotics).  Wearing shoes that provide enough room for the toes.  Doing toe-stretching exercises at home.  Taking an NSAID to reduce pain and swelling.  If these treatments do not help or the toe cannot be straightened, surgery is the next option. The most common surgeries used to straighten a hammer toe include:  Arthroplasty. In this procedure, part of the joint is removed, and that allows the toe to straighten.  Fusion. In this procedure, cartilage between the two bones of the joint is taken out and the bones are fused together into one longer bone.  Implantation. In this procedure, part of the bone is removed and replaced with an implant to let the toe move again.  Flexor tendon transfer. In this procedure, the tendons that curl the toes down (flexor tendons) are repositioned.  Follow these instructions at home:  Take over-the-counter and prescription medicines only as told by your health care provider.  Do toe straightening and stretching exercises as told by your health care provider.  Keep all   follow-up visits as told by your health care provider. This is important. How is this prevented?  Wear shoes that give your toes enough room and do not cause pain.  Do not wear high-heeled shoes. Contact a health care provider if:  Your pain gets worse.  Your toe becomes red or swollen.  You develop an open sore on your toe. This information is not intended to replace advice given to you by your health care provider. Make sure you discuss any questions you have with your health care  provider. Document Released: 08/24/2000 Document Revised: 03/16/2016 Document Reviewed: 12/21/2015 Elsevier Interactive Patient Education  2018 Elsevier Inc.  

## 2018-07-24 NOTE — Progress Notes (Signed)
   Subjective:    Patient ID: [name], male    DOB: December 15, 1955, 62 y.o.   MRN: [id number]  HPI    Review of Systems  All other systems reviewed and are negative.      Objective:   Physical Exam        Assessment & Plan:

## 2018-07-29 NOTE — Progress Notes (Signed)
Subjective:   Patient ID: [name], male   DOB: 62 y.o.   MRN: [id number]   HPI Patient presents stating that he hurt his left foot about 6 months ago when he traumatized it and his second toe is come up in the air and he is developed a lot of intense discomfort and it is hard for him to walk.  Patient also states the toe itself bothers him when he wear shoes.  Patient does smoke occasionally and likes to be active   Review of Systems  All other systems reviewed and are negative.       Objective:  Physical Exam  Constitutional: He appears well-developed and well-nourished.  Cardiovascular: Intact distal pulses.  Pulmonary/Chest: Effort normal.  Musculoskeletal: Normal range of motion.  Neurological: He is alert.  Skin: Skin is warm.  Nursing note and vitals reviewed.   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  Patient has good digital perfusion is well oriented x3 and the toe is not contacting the ground and there is redness on top of the toe also.  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  At this point I am trying conservative care and I did a proximal nerve block of the area under sterile conditions I aspirated the joint getting out a small amount of a pinkish fluid indicating trauma to the joint and injected quarter cc dexamethasone Kenalog and applied thick pad to reduce pressure on the joint and advised on rigid bottom shoes.  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

## 2018-08-15 ENCOUNTER — Ambulatory Visit (INDEPENDENT_AMBULATORY_CARE_PROVIDER_SITE_OTHER): Payer: 59 | Admitting: Podiatry

## 2018-08-15 ENCOUNTER — Encounter: Payer: Self-pay | Admitting: Podiatry

## 2018-08-15 DIAGNOSIS — M779 Enthesopathy, unspecified: Secondary | ICD-10-CM

## 2018-08-15 DIAGNOSIS — M2042 Other hammer toe(s) (acquired), left foot: Secondary | ICD-10-CM | POA: Diagnosis not present

## 2018-08-20 NOTE — Progress Notes (Signed)
Subjective:   Patient ID: [name], male   DOB: 62 y.o.   MRN: [id number]   HPI Patient states that the joint is feeling a lot better after he worked on it and while it still is sore it is improved over it was previously.  Patient understands the position of his toe and is still concerned about the rubbing on the top   ROS      Objective:  Physical Exam  Neurovascular status intact with continued elevation of the rigid nature second digit left with inflammation of the second MPJ which is improved but is still present upon deep palpation     Assessment:  Improvement of capsular symptoms left with pain still noted upon deep palpation     Plan:  H&P condition reviewed and recommended at this point rigid bottom shoes and padding for the second toe with possibility for surgical intervention at one point in future for orthotics if symptoms are to a lower state.  Patient will be reevaluated and hopefully this will give him great long-term relief

## 2018-11-14 DIAGNOSIS — Z0000 Encounter for general adult medical examination without abnormal findings: Secondary | ICD-10-CM | POA: Diagnosis not present

## 2018-11-14 DIAGNOSIS — E782 Mixed hyperlipidemia: Secondary | ICD-10-CM | POA: Diagnosis not present

## 2019-06-19 ENCOUNTER — Other Ambulatory Visit: Payer: Self-pay

## 2019-06-19 DIAGNOSIS — Z20822 Contact with and (suspected) exposure to covid-19: Secondary | ICD-10-CM

## 2019-06-20 LAB — NOVEL CORONAVIRUS, NAA: SARS-CoV-2, NAA: NOT DETECTED

## 2020-04-01 ENCOUNTER — Encounter: Payer: Self-pay | Admitting: Neurology

## 2020-04-04 ENCOUNTER — Other Ambulatory Visit: Payer: Self-pay | Admitting: Family Medicine

## 2020-04-04 DIAGNOSIS — R519 Headache, unspecified: Secondary | ICD-10-CM

## 2020-04-08 ENCOUNTER — Other Ambulatory Visit (HOSPITAL_COMMUNITY): Payer: Self-pay | Admitting: Family Medicine

## 2020-04-08 DIAGNOSIS — R29818 Other symptoms and signs involving the nervous system: Secondary | ICD-10-CM

## 2020-04-13 ENCOUNTER — Telehealth: Payer: Self-pay | Admitting: *Deleted

## 2020-04-13 NOTE — Telephone Encounter (Signed)
Diagnosis given by Ammie Dalton, PA-C for ZIO monitor was R29.818 Focal Neurological deficit.  This is not a covered diagnosis for a long term monitor, (holter monitor).   Did he mean dizziness R42 which is a covered dx.  Please advise.

## 2020-04-13 NOTE — Telephone Encounter (Signed)
Follow up   Noank from [facility] called, she said this is the diagnosis Ruby Cola noted on pt's chart R51.9 acute non - intractable headache, unspecified. She said if this still wont work to call her back

## 2020-04-13 NOTE — Telephone Encounter (Signed)
R51.9 Not covered for long term monitor/ cardiac holter monitor.  They will check with Ammie Dalton, PA-C and let us know 04/14/2020.

## 2020-04-14 ENCOUNTER — Other Ambulatory Visit: Payer: Self-pay | Admitting: *Deleted

## 2020-04-14 DIAGNOSIS — R42 Dizziness and giddiness: Secondary | ICD-10-CM

## 2020-04-14 NOTE — Telephone Encounter (Signed)
Sherri from [place] at [facility] called to give the new ICD 10 code for the patient's referral. Please call her back at [phone], and request to have Sherri paged.

## 2020-04-14 NOTE — Telephone Encounter (Signed)
New Dx code for monitor is R42 dizziness per Ammie Dalton, PA-C.

## 2020-04-20 ENCOUNTER — Ambulatory Visit (INDEPENDENT_AMBULATORY_CARE_PROVIDER_SITE_OTHER): Payer: 59

## 2020-04-20 DIAGNOSIS — R42 Dizziness and giddiness: Secondary | ICD-10-CM | POA: Diagnosis not present

## 2020-04-29 ENCOUNTER — Ambulatory Visit (HOSPITAL_COMMUNITY): Payer: 59 | Attending: Cardiology

## 2020-04-29 ENCOUNTER — Other Ambulatory Visit: Payer: Self-pay

## 2020-04-29 DIAGNOSIS — E669 Obesity, unspecified: Secondary | ICD-10-CM | POA: Diagnosis not present

## 2020-04-29 DIAGNOSIS — I10 Essential (primary) hypertension: Secondary | ICD-10-CM | POA: Diagnosis not present

## 2020-04-29 DIAGNOSIS — E785 Hyperlipidemia, unspecified: Secondary | ICD-10-CM | POA: Insufficient documentation

## 2020-04-29 DIAGNOSIS — R29818 Other symptoms and signs involving the nervous system: Secondary | ICD-10-CM | POA: Diagnosis not present

## 2020-04-29 LAB — ECHOCARDIOGRAM COMPLETE
AR max vel: 1.78 cm2
AV Area VTI: 2.02 cm2
AV Area mean vel: 1.82 cm2
AV Mean grad: 14.5 mmHg
AV Peak grad: 26.4 mmHg
Ao pk vel: 2.57 m/s
Area-P 1/2: 2.43 cm2
S' Lateral: 3.4 cm

## 2020-04-30 ENCOUNTER — Ambulatory Visit
Admission: RE | Admit: 2020-04-30 | Discharge: 2020-04-30 | Disposition: A | Discharge status: Home / Self Care | Payer: 59 | Source: Ambulatory Visit | Attending: Family Medicine | Admitting: Family Medicine

## 2020-04-30 DIAGNOSIS — R519 Headache, unspecified: Secondary | ICD-10-CM

## 2020-04-30 MED ORDER — GADOBENATE DIMEGLUMINE 529 MG/ML IV SOLN
20.0000 mL | Freq: Once | INTRAVENOUS | Status: AC | PRN
  Administered 2020-04-30: 20 mL via INTRAVENOUS

## 2020-04-30 MED ORDER — GADOBENATE DIMEGLUMINE 529 MG/ML IV SOLN: 20.0000 mL | Freq: Once | INTRAVENOUS | Status: DC | PRN

## 2020-05-26 NOTE — Progress Notes (Addendum)
NEUROLOGY CONSULTATION NOTE  [name] MRN: [id number] DOB: 1956-07-11  Referring provider: Orpah Melter, MD Primary care provider: Ammie Dalton, PA-C  Reason for consult:  Headache, double vision, nystagmus, left arm numbness  HISTORY OF PRESENT ILLNESS: [name] is a 64 year old right-handed male with HTN who presents for headache, double vision, nystagmus, and left arm numbness.  History supplemented by referring provider's notes.  He is a Administrator.  About 2 months ago, he was getting ready to go to work when he felt like he was going to get a headache.  He endorsed a dull pressure around his eyes, forehead and temples.  He had some mild nausea as well.  It never progressed to anything more.  While driving to work he had a brief episode where he felt his eyes jumping up and down for about a minute.  He had the dull head pressure for the rest of the day.  He later followed up with his optometrist who found nothing significant on his exam.    He does report similar dull headaches in the past, but never associated with nausea.  He denies remote history of headaches or anything suggestive of migraines.  For the past two years, he reports brief episode of vertical diplopia that would occur if he stares at an object.  It would last up to 10 minutes, even if he takes his gaze off of the object.  Over the same period of time, he has been more sensitive to light.  Vision is more blurred when wearing sunglasses, even with his prescription    In 1997, a piece of sheetrock fell on his upper back and he sustained neck pain and degenerative changes in his cervical spine.  Over past 2 years he reports numbness along the radial aspect of his left forearm.  No associated pain, weakness or sensory symptoms radiating into the hand or fingers .  Previously he noticed it off and on.  Since the episode of nystagmus, it has been occurring daily.  MRI of brain with and without contrast on  04/30/2020 personally reviewed showed mild to moderate chronic small vessel ischemic changes in the bilateral cerebral white matter but otherwise unremarkable with no explanation for symptoms.  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    Cardiac workup for dizziness included echocardiogram on 04/29/2020 which showed EF 55-60% with Grade I diastolic dysfunction and mild aortic dilatation, as well as a 14 day ZIO patch monitoring in August,   PAST MEDICAL HISTORY: Past Medical History:  Diagnosis Date  . Headache   . HTN (hypertension)   . Hyperlipidemia    PAST SURGICAL HISTORY: Past Surgical History:  Procedure Laterality Date  . gallbladder removed      MEDICATIONS: Current Outpatient Medications on File Prior to Visit  Medication Sig Dispense Refill  . amlodipine-olmesartan (AZOR) 10-20 MG tablet Take 1 tablet by mouth daily.  1  . lovastatin (MEVACOR) 40 MG tablet Take 40 mg by mouth daily. with food  1   No current facility-administered medications on file prior to visit.    ALLERGIES: No Known Allergies  FAMILY HISTORY: History reviewed. No pertinent family history.  SOCIAL HISTORY: Social History   Socioeconomic History  . Marital status: Married    Spouse name: Not on file  . Number of children: Not on file  .  Years of education: Not on file  . Highest education level: Not on file  Occupational History  . Not on file  Tobacco Use  . Smoking status: Current Every Day Smoker    Types: Cigarettes  . Smokeless tobacco: Never Used  Substance and Sexual Activity  . Alcohol use: Not on file  . Drug use: Not on file  . Sexual activity: Not on file  Other Topics Concern  . Not on file  Social History Narrative  . Not on file   Social Determinants of Health   Financial Resource Strain:   . Difficulty of Paying Living  Expenses: Not on file  Food Insecurity:   . Worried About Charity fundraiser in the Last Year: Not on file  . Ran Out of Food in the Last Year: Not on file  Transportation Needs:   . Lack of Transportation (Medical): Not on file  . Lack of Transportation (Non-Medical): Not on file  Physical Activity:   . Days of Exercise per Week: Not on file  . Minutes of Exercise per Session: Not on file  Stress:   . Feeling of Stress : Not on file  Social Connections:   . Frequency of Communication with Friends and Family: Not on file  . Frequency of Social Gatherings with Friends and Family: Not on file  . Attends Religious Services: Not on file  . Active Member of Clubs or Organizations: Not on file  . Attends Archivist Meetings: Not on file  . Marital Status: Not on file  Intimate Partner Violence:   . Fear of Current or Ex-Partner: Not on file  . Emotionally Abused: Not on file  . Physically Abused: Not on file  . Sexually Abused: Not on file    PHYSICAL EXAM: Blood pressure (!) 154/97, pulse 75, resp. rate 18, height 5' 11.5" (1.816 m), weight 255 lb (115.7 kg), SpO2 98 %. General: No acute distress.  Patient appears well-groomed.   Head:  Normocephalic/atraumatic Eyes:  fundi examined but not visualized Neck: supple, no paraspinal tenderness, full range of motion Back: No paraspinal tenderness Heart: regular rate and rhythm Lungs: Clear to auscultation bilaterally. Vascular: No carotid bruits. Neurological Exam: Mental status: alert and oriented to person, place, and time, recent and remote memory intact, fund of knowledge intact, attention and concentration intact, speech fluent and not dysarthric, language intact. Cranial nerves: CN I: not tested CN II: pupils equal, round and reactive to light, visual fields intact CN III, IV, VI:  full range of motion, no nystagmus, no ptosis CN V: facial sensation intact CN VII: upper and lower face symmetric CN VIII: hearing  intact CN IX, X: gag intact, uvula midline CN XI: sternocleidomastoid and trapezius muscles intact CN XII: tongue midline Bulk & Tone: normal, no fasciculations. Motor:  5/5 throughout  Sensation:  Pinprick sensation reduced along radial aspect of left forearm; otherwise pinprick and vibration sensation intact. Deep Tendon Reflexes:  2+ throughout, toes downgoing.  Finger to nose testing:  Without dysmetria.  Heel to shin:  Without dysmetria.  Gait:  Normal station and stride.  Able to turn and tandem walk. Romberg negative.  IMPRESSION: 1.  Transient episode of vertical nystagmus with dull headache and nausea.  No associated lateralizing symptoms such as weakness.  Semiology not specific as both TIA and migraine are possible.  However, he did not exhibit any other lateralizing symptoms to support TIA and he doesn't really have a history of migraines.  Both diagnoses would  be a diagnosis of exclusion.   2.  Intermittent diplopia, triggered with focusing. While he has evidence of cerebrovascular disease in the cerebral hemispheres, his brainstem and cerebellum look clear.  3.  Headache, unspecified.    PLAN: 1.  Would err on side of caution and have him start ASA 81mg  daily.  He is already treated with statin therapy and for elevated blood pressure.   2.  Would check CTA of head and neck.  Cardiac workup complete. 3.  Follow up with PCP regarding elevated blood pressure 4.  Suspicion low but given the double vision, will check myasthenia gravis panel. 5.  I will also refer him to Dr. Marshall Cork at [facility] to evaluate for an ocular etiology for his diplopia.   6.  Follow up after testing.  Thank you for allowing me to take part in the care of this patient.  Metta Clines, DO  CC:  Orpah Melter, MD  Ammie Dalton, PA-C

## 2020-05-27 ENCOUNTER — Encounter: Payer: Self-pay | Admitting: Neurology

## 2020-05-27 ENCOUNTER — Other Ambulatory Visit: Payer: Self-pay

## 2020-05-27 ENCOUNTER — Ambulatory Visit (INDEPENDENT_AMBULATORY_CARE_PROVIDER_SITE_OTHER): Payer: 59 | Admitting: Neurology

## 2020-05-27 VITALS — BP 154/97 | HR 75 | Resp 18 | Ht 71.5 in | Wt 255.0 lb

## 2020-05-27 DIAGNOSIS — H532 Diplopia: Secondary | ICD-10-CM

## 2020-05-27 DIAGNOSIS — I10 Essential (primary) hypertension: Secondary | ICD-10-CM

## 2020-05-27 DIAGNOSIS — R519 Headache, unspecified: Secondary | ICD-10-CM

## 2020-05-27 DIAGNOSIS — H5500 Unspecified nystagmus: Secondary | ICD-10-CM

## 2020-05-27 NOTE — Patient Instructions (Signed)
Unclear what the episode was.  It may have been a TIA.  It may have been migraine.  But history does not make it certain.  1.  I do want you to start aspirin 81mg  daily 2.  We will check CTA of head and neck 3.  I will check myasthenia gravis panel 4.  I will refer you to ophthalmology, Dr. Marshall Cork at [facility] 5.  Further recommendations pending results.  Otherwise, follow up in 4 to 6 months.

## 2020-06-10 ENCOUNTER — Ambulatory Visit
Admission: RE | Admit: 2020-06-10 | Discharge: 2020-06-10 | Disposition: A | Discharge status: Home / Self Care | Payer: 59 | Source: Ambulatory Visit | Attending: Neurology | Admitting: Neurology

## 2020-06-10 DIAGNOSIS — R519 Headache, unspecified: Secondary | ICD-10-CM

## 2020-06-10 DIAGNOSIS — H532 Diplopia: Secondary | ICD-10-CM

## 2020-06-10 DIAGNOSIS — H5500 Unspecified nystagmus: Secondary | ICD-10-CM

## 2020-06-10 MED ORDER — IOPAMIDOL (ISOVUE-370) INJECTION 76%
75.0000 mL | Freq: Once | INTRAVENOUS | Status: AC | PRN
  Administered 2020-06-10: 75 mL via INTRAVENOUS

## 2020-06-13 ENCOUNTER — Other Ambulatory Visit: Payer: Self-pay

## 2020-06-13 DIAGNOSIS — R9389 Abnormal findings on diagnostic imaging of other specified body structures: Secondary | ICD-10-CM

## 2020-06-13 DIAGNOSIS — Q2549 Other congenital malformations of aorta: Secondary | ICD-10-CM

## 2020-06-13 NOTE — Progress Notes (Signed)
Referral for Vascular Surgery added per DR. Jaffe note on CT results.

## 2020-07-08 ENCOUNTER — Ambulatory Visit: Payer: 59 | Admitting: Neurology

## 2020-08-19 ENCOUNTER — Encounter: Payer: Self-pay | Admitting: Vascular Surgery

## 2020-08-19 ENCOUNTER — Other Ambulatory Visit: Payer: Self-pay

## 2020-08-19 ENCOUNTER — Ambulatory Visit (INDEPENDENT_AMBULATORY_CARE_PROVIDER_SITE_OTHER): Payer: 59 | Admitting: Vascular Surgery

## 2020-08-19 VITALS — BP 132/78 | HR 70 | Temp 98.1°F | Resp 20 | Ht 71.5 in | Wt 253.0 lb

## 2020-08-19 DIAGNOSIS — I700 Atherosclerosis of aorta: Secondary | ICD-10-CM | POA: Diagnosis not present

## 2020-08-19 DIAGNOSIS — I771 Stricture of artery: Secondary | ICD-10-CM

## 2020-08-19 NOTE — Progress Notes (Signed)
Patient ID: [name], male   DOB: [birth year], 64 y.o.   MRN: [id number]  Reason for Consult: New Patient (Initial Visit)   Referred by Pieter Partridge, DO  Subjective:     HPI:  [name] is a 64 y.o. male has had multiple episodes of headache with vertical nystagmus and also diplopia.  The diplopia has has occurred more often but has been intermittent.  He has undergone CT angio of his neck and head now here for evaluation.  Denies any history of stroke, TIA or amaurosis and this was confirmed with MRI and has never had any stroke.  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  He denies any personal or family history of aneurysm.  He denies any coronary artery disease in the past.  He does take a statin was recently told to take aspirin by neurology.  Past Medical History:  Diagnosis Date  . Headache   . HTN (hypertension)   . Hyperlipidemia    History reviewed. No pertinent family history. Past Surgical History:  Procedure Laterality Date  . gallbladder removed      Short Social History:  Social History   Tobacco Use  . Smoking status: Current Every Day Smoker    Types: Cigarettes  . Smokeless tobacco: Never Used  Substance Use Topics  . Alcohol use: Yes    Comment: occasional    No Known Allergies  Current Outpatient Medications  Medication Sig Dispense Refill  . amlodipine-olmesartan (AZOR) 10-20 MG tablet Take 1 tablet by mouth daily.  1  . lovastatin (MEVACOR) 40 MG tablet Take 40 mg by mouth daily. with food  1   No current facility-administered medications for this visit.    Review of Systems  Constitutional:  Constitutional negative. HENT: HENT negative.  Eyes: Positive for visual disturbance.   Respiratory: Respiratory negative.  Cardiovascular: Cardiovascular negative.  GI: Gastrointestinal negative.  Musculoskeletal: Musculoskeletal negative.  Skin: Skin negative.  Neurological: Positive for headaches.   Hematologic: Hematologic/lymphatic negative.  Psychiatric: Psychiatric negative.        Objective:  Objective   Vitals:   08/19/20 1413 08/19/20 1416  BP: (!) 156/96 132/78  Pulse: 70   Resp: 20   Temp: 98.1 F (36.7 C)   SpO2: 96%   Weight: 253 lb (114.8 kg)   Height: 5' 11.5" (1.816 m)    Body mass index is 34.79 kg/m.  Physical Exam Constitutional:      Appearance: Normal appearance. He is obese.  HENT:     Head: Normocephalic.     Nose:     Comments: Wearing a mask Eyes:     Pupils: Pupils are equal, round, and reactive to light.  Neck:     Comments: Right carotid bruit Cardiovascular:     Rate and Rhythm: Normal rate.     Pulses: Normal pulses.  Pulmonary:     Effort: Pulmonary effort is normal.     Breath sounds: Normal breath sounds.  Abdominal:     General: Abdomen is flat.     Palpations: Abdomen is soft. There is no mass.  Musculoskeletal:        General: No swelling. Normal range of motion.  Skin:    General: Skin is warm.     Capillary Refill: Capillary refill takes less than 2 seconds.  Neurological:     General: No focal deficit present.     Mental Status: He is alert.  Psychiatric:  Mood and Affect: Mood normal.        Thought Content: Thought content normal.        Judgment: Judgment normal.     Data: CTA neck/head IMPRESSION: 1. Recommend Vascular Surgery consultation regarding: - distal Aortic Arch 12 mm ulcerated plaque and/or developing Pseudoaneurysm. 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  2. 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 Probable retrograde reconstitution of the distal vessel.  3. Other great vessel and carotid atherosclerosis without hemodynamically significant stenosis.  4. Branches of the anterior and posterior circulation appear  normal. Normal CT appearance of the brain.  5. Aortic Atherosclerosis (ICD10-I70.0) and Emphysema (ICD10-J43.9).      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  Patient has started taking aspirin he will continue statin.  Given that these are asymptomatic lesions he has a palpable radial artery pulse and he wants to get to the bottom of his visual disturbances we have decided to follow-up in 6 months with repeat CT angio.  We discussed the signs and symptoms of stroke for which to to seek urgent medical evaluation.  He demonstrates good understanding we will see him in 6 months with repeat CT.     Waynetta Sandy MD [facility]

## 2020-11-16 NOTE — Progress Notes (Addendum)
NEUROLOGY FOLLOW UP OFFICE NOTE  [name] [id number]  Assessment/Plan:   1.  Diplopia - unclear etiology.  Last visit I ordered myasthenia gravis panel but it was never performed.  Will have him check it today.  If unremarkable, would just monitor.  Continue ASA 81mg  daily.  ADDENDUM:  Myasthenia panel negative.  Subjective:  [name] is a 65 year old right-handed male with HTN who follows up for episode of headache, double vision, nystagmus and left arm numbness.  UPDATE: Last visit, I ordered myasthenia gravis panel but it was never performed.  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  He saw vascular surgery in December - plan is to continue ASA and repeat CTA in June.  Saw Dr. Kathlen Mody of ophthalmology in October.  [name] consistent with comitant exodeviation.  Suggested workup for myasthenia gravis.  He hasn't had a recurrence of double vision and headache since last visit.  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  In July 2021, he was getting ready to go to work when he felt like he was going to get a headache.  He endorsed a dull pressure around his eyes, forehead and temples.  He had some mild nausea as well.  It never progressed to anything more.  While driving to work he had a brief episode where he felt his eyes jumping up and down for about a minute.  He had the dull head pressure for the rest of the day.  He later followed up with his optometrist who found nothing significant on his exam.    He does report similar dull headaches in the past, but never associated with nausea.  He denies remote history of headaches or  anything suggestive of migraines.  For the past two years, he reports brief episode of vertical diplopia that would occur if he stares at an object.  It would last up to 10 minutes, even if he takes his gaze off of the object.  Over the same period of time, he has been more sensitive to light.  Vision is more blurred when wearing sunglasses, even with his prescription    In 1997, a piece of sheetrock fell on his upper back and he sustained neck pain and degenerative changes in his cervical spine.  Over past 2 years he reports numbness along the radial aspect of his left forearm.  No associated pain, weakness or sensory symptoms radiating into the hand or fingers .  Previously he noticed it off and on.  Since the episode of nystagmus, it has been occurring daily.  MRI of brain with and without contrast on 04/30/2020 personally reviewed showed mild to moderate chronic small vessel ischemic changes in the bilateral cerebral white matter but otherwise unremarkable with no explanation for symptoms.  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    Cardiac workup for dizziness included echocardiogram on 04/29/2020 which showed EF 55-60% with Grade I diastolic dysfunction and mild aortic dilatation, as well as a 14 day ZIO patch monitoring in August,   PAST MEDICAL HISTORY: Past Medical History:  Diagnosis Date  . Headache   . HTN (hypertension)   . Hyperlipidemia     MEDICATIONS: Current Outpatient Medications on File Prior to Visit  Medication Sig Dispense Refill  . amlodipine-olmesartan (AZOR) 10-20 MG tablet Take 1 tablet by mouth daily.  1  . lovastatin (MEVACOR) 40 MG tablet Take 40 mg by mouth daily. with food  1   No current facility-administered medications on file prior to visit.    ALLERGIES: No Known Allergies  FAMILY HISTORY: No family history  on file.    Objective:  Blood pressure (!) 155/89, pulse 73, height 5\' 11"  (1.803 m), weight 257 lb (116.6 kg), SpO2 97 %. General: No acute distress.  Patient appears well-groomed.   Head:  Normocephalic/atraumatic Eyes:  Fundi examined but not visualized Neck: supple, no paraspinal tenderness, full range of motion Heart:  Regular rate and rhythm Lungs:  Clear to auscultation bilaterally Back: No paraspinal tenderness Neurological Exam: alert and oriented to person, place, and time. Attention span and concentration intact, recent and remote memory intact, fund of knowledge intact.  Speech fluent and not dysarthric, language intact.  CN II-XII intact. Bulk and tone normal, muscle strength 5/5 throughout.  Sensation to light touch  intact.  Deep tendon reflexes 2+ throughout.  Finger to nose testing intact.  Gait normal, Romberg negative.     Metta Clines, DO  CC: Orpah Melter, MD

## 2020-11-18 ENCOUNTER — Ambulatory Visit (INDEPENDENT_AMBULATORY_CARE_PROVIDER_SITE_OTHER): Payer: 59 | Admitting: Neurology

## 2020-11-18 ENCOUNTER — Other Ambulatory Visit: Payer: Self-pay

## 2020-11-18 ENCOUNTER — Other Ambulatory Visit (INDEPENDENT_AMBULATORY_CARE_PROVIDER_SITE_OTHER): Payer: 59

## 2020-11-18 ENCOUNTER — Encounter: Payer: Self-pay | Admitting: Neurology

## 2020-11-18 VITALS — BP 155/89 | HR 73 | Ht 71.0 in | Wt 257.0 lb

## 2020-11-18 DIAGNOSIS — H532 Diplopia: Secondary | ICD-10-CM | POA: Diagnosis not present

## 2020-11-18 NOTE — Patient Instructions (Signed)
1.  Check myasthenia gravis panel 2.  If unremarkable, monitor symptoms for now.  Further recommendations pending results.

## 2020-11-23 ENCOUNTER — Telehealth: Payer: Self-pay

## 2020-11-23 NOTE — Telephone Encounter (Signed)
-----   Message from Pieter Partridge, DO sent at 11/23/2020  6:11 AM EDT ----- Vita Barley, can we find out if the patient went to the lab to have the myasthenia gravis panel performed?  I have ordered it twice already, the last time just last week and it appears he never went to the lab.  I really want him to have this done to complete the workup. ----- Message ----- From: SYSTEM Sent: 11/23/2020  12:13 AM EDT To: Pieter Partridge, DO

## 2020-11-23 NOTE — Telephone Encounter (Signed)
LMOVM for pt to call the office back.

## 2020-11-24 ENCOUNTER — Other Ambulatory Visit: Payer: Self-pay

## 2020-11-24 NOTE — Telephone Encounter (Signed)
Patient called back and said he did go to the lab and get his test right after his last visit. Patient is confused about why he needs it again, he said.

## 2020-11-28 LAB — MYASTHENIA GRAVIS PANEL 1
A CHR BINDING ABS: <0.3 nmol/L
STRIATED MUSCLE AB SCREEN: NEGATIVE

## 2020-12-30 DIAGNOSIS — K219 Gastro-esophageal reflux disease without esophagitis: Secondary | ICD-10-CM | POA: Diagnosis not present

## 2020-12-30 DIAGNOSIS — E782 Mixed hyperlipidemia: Secondary | ICD-10-CM | POA: Diagnosis not present

## 2020-12-30 DIAGNOSIS — I10 Essential (primary) hypertension: Secondary | ICD-10-CM | POA: Diagnosis not present

## 2020-12-30 DIAGNOSIS — Z0000 Encounter for general adult medical examination without abnormal findings: Secondary | ICD-10-CM | POA: Diagnosis not present

## 2021-01-20 ENCOUNTER — Other Ambulatory Visit: Payer: Self-pay

## 2021-01-20 DIAGNOSIS — I771 Stricture of artery: Secondary | ICD-10-CM

## 2021-01-20 DIAGNOSIS — I700 Atherosclerosis of aorta: Secondary | ICD-10-CM

## 2021-02-02 DIAGNOSIS — M25511 Pain in right shoulder: Secondary | ICD-10-CM | POA: Diagnosis not present

## 2021-02-03 ENCOUNTER — Encounter (HOSPITAL_COMMUNITY): Payer: Self-pay | Admitting: *Deleted

## 2021-02-03 ENCOUNTER — Inpatient Hospital Stay (HOSPITAL_COMMUNITY)
Admission: EM | Admit: 2021-02-03 | Discharge: 2021-02-08 | DRG: 871 | Disposition: A | Discharge status: Home / Self Care | Payer: 59 | Attending: Internal Medicine | Admitting: Internal Medicine

## 2021-02-03 ENCOUNTER — Emergency Department (HOSPITAL_COMMUNITY): Payer: 59

## 2021-02-03 ENCOUNTER — Other Ambulatory Visit: Payer: Self-pay

## 2021-02-03 DIAGNOSIS — R059 Cough, unspecified: Secondary | ICD-10-CM

## 2021-02-03 DIAGNOSIS — E7800 Pure hypercholesterolemia, unspecified: Secondary | ICD-10-CM | POA: Diagnosis present

## 2021-02-03 DIAGNOSIS — Z7982 Long term (current) use of aspirin: Secondary | ICD-10-CM

## 2021-02-03 DIAGNOSIS — I350 Nonrheumatic aortic (valve) stenosis: Secondary | ICD-10-CM | POA: Diagnosis present

## 2021-02-03 DIAGNOSIS — Z6834 Body mass index (BMI) 34.0-34.9, adult: Secondary | ICD-10-CM

## 2021-02-03 DIAGNOSIS — R197 Diarrhea, unspecified: Secondary | ICD-10-CM | POA: Diagnosis present

## 2021-02-03 DIAGNOSIS — J209 Acute bronchitis, unspecified: Secondary | ICD-10-CM | POA: Diagnosis not present

## 2021-02-03 DIAGNOSIS — J189 Pneumonia, unspecified organism: Secondary | ICD-10-CM | POA: Diagnosis present

## 2021-02-03 DIAGNOSIS — I700 Atherosclerosis of aorta: Secondary | ICD-10-CM | POA: Diagnosis present

## 2021-02-03 DIAGNOSIS — R079 Chest pain, unspecified: Secondary | ICD-10-CM | POA: Diagnosis not present

## 2021-02-03 DIAGNOSIS — R0602 Shortness of breath: Secondary | ICD-10-CM

## 2021-02-03 DIAGNOSIS — Z79899 Other long term (current) drug therapy: Secondary | ICD-10-CM

## 2021-02-03 DIAGNOSIS — W57XXXA Bitten or stung by nonvenomous insect and other nonvenomous arthropods, initial encounter: Secondary | ICD-10-CM | POA: Diagnosis present

## 2021-02-03 DIAGNOSIS — E669 Obesity, unspecified: Secondary | ICD-10-CM | POA: Diagnosis present

## 2021-02-03 DIAGNOSIS — S70362A Insect bite (nonvenomous), left thigh, initial encounter: Secondary | ICD-10-CM | POA: Diagnosis present

## 2021-02-03 DIAGNOSIS — E876 Hypokalemia: Secondary | ICD-10-CM | POA: Diagnosis present

## 2021-02-03 DIAGNOSIS — Z20822 Contact with and (suspected) exposure to covid-19: Secondary | ICD-10-CM | POA: Diagnosis present

## 2021-02-03 DIAGNOSIS — Z8249 Family history of ischemic heart disease and other diseases of the circulatory system: Secondary | ICD-10-CM

## 2021-02-03 DIAGNOSIS — I471 Supraventricular tachycardia: Secondary | ICD-10-CM | POA: Diagnosis present

## 2021-02-03 DIAGNOSIS — I10 Essential (primary) hypertension: Secondary | ICD-10-CM | POA: Diagnosis present

## 2021-02-03 DIAGNOSIS — A419 Sepsis, unspecified organism: Secondary | ICD-10-CM | POA: Diagnosis not present

## 2021-02-03 DIAGNOSIS — M25511 Pain in right shoulder: Secondary | ICD-10-CM | POA: Diagnosis present

## 2021-02-03 DIAGNOSIS — F17210 Nicotine dependence, cigarettes, uncomplicated: Secondary | ICD-10-CM | POA: Diagnosis present

## 2021-02-03 DIAGNOSIS — E785 Hyperlipidemia, unspecified: Secondary | ICD-10-CM | POA: Diagnosis present

## 2021-02-03 DIAGNOSIS — R509 Fever, unspecified: Secondary | ICD-10-CM

## 2021-02-03 DIAGNOSIS — I2510 Atherosclerotic heart disease of native coronary artery without angina pectoris: Secondary | ICD-10-CM | POA: Diagnosis present

## 2021-02-03 DIAGNOSIS — Z791 Long term (current) use of non-steroidal anti-inflammatories (NSAID): Secondary | ICD-10-CM

## 2021-02-03 DIAGNOSIS — I214 Non-ST elevation (NSTEMI) myocardial infarction: Secondary | ICD-10-CM | POA: Diagnosis present

## 2021-02-03 DIAGNOSIS — J439 Emphysema, unspecified: Secondary | ICD-10-CM | POA: Diagnosis present

## 2021-02-03 DIAGNOSIS — N179 Acute kidney failure, unspecified: Secondary | ICD-10-CM | POA: Diagnosis present

## 2021-02-03 HISTORY — DX: Body mass index (BMI) 34.0-34.9, adult: Z68.34

## 2021-02-03 LAB — URINALYSIS, ROUTINE W REFLEX MICROSCOPIC
Bacteria, UA: NONE SEEN
Bilirubin Urine: NEGATIVE
Glucose, UA: NEGATIVE mg/dL
Ketones, ur: NEGATIVE mg/dL
Leukocytes,Ua: NEGATIVE
Nitrite: NEGATIVE
Protein, ur: 30 mg/dL — AB
Specific Gravity, Urine: 1.018 (ref 1.005–1.030)
pH: 6 (ref 5.0–8.0)

## 2021-02-03 LAB — CBC WITH DIFFERENTIAL/PLATELET
Abs Immature Granulocytes: 0.05 10*3/uL (ref 0.00–0.07)
Basophils Absolute: 0 10*3/uL (ref 0.0–0.1)
Basophils Relative: 0 %
Eosinophils Absolute: 0.1 10*3/uL (ref 0.0–0.5)
Eosinophils Relative: 0 %
HCT: 43.2 % (ref 39.0–52.0)
Hemoglobin: 15.1 g/dL (ref 13.0–17.0)
Immature Granulocytes: 0 %
Lymphocytes Relative: 10 %
Lymphs Abs: 1.2 10*3/uL (ref 0.7–4.0)
MCH: 34.5 pg — ABNORMAL HIGH (ref 26.0–34.0)
MCHC: 35 g/dL (ref 30.0–36.0)
MCV: 98.6 fL (ref 80.0–100.0)
Monocytes Absolute: 1 10*3/uL (ref 0.1–1.0)
Monocytes Relative: 8 %
Neutro Abs: 9.7 10*3/uL — ABNORMAL HIGH (ref 1.7–7.7)
Neutrophils Relative %: 82 %
Platelets: 150 10*3/uL (ref 150–400)
RBC: 4.38 MIL/uL (ref 4.22–5.81)
RDW: 12.7 % (ref 11.5–15.5)
WBC: 11.9 10*3/uL — ABNORMAL HIGH (ref 4.0–10.5)
nRBC: 0 % (ref 0.0–0.2)

## 2021-02-03 LAB — TROPONIN I (HIGH SENSITIVITY)
Troponin I (High Sensitivity): 12 ng/L (ref <18)
Troponin I (High Sensitivity): 14 ng/L (ref <18)

## 2021-02-03 LAB — PROTIME-INR
INR: 1.1 (ref 0.8–1.2)
Prothrombin Time: 14.6 seconds (ref 11.4–15.2)

## 2021-02-03 LAB — COMPREHENSIVE METABOLIC PANEL
ALT: 26 U/L (ref 0–44)
AST: 21 U/L (ref 15–41)
Albumin: 3.8 g/dL (ref 3.5–5.0)
Alkaline Phosphatase: 83 U/L (ref 38–126)
Anion gap: 7 (ref 5–15)
BUN: 11 mg/dL (ref 8–23)
CO2: 23 mmol/L (ref 22–32)
Calcium: 10.3 mg/dL (ref 8.9–10.3)
Chloride: 104 mmol/L (ref 98–111)
Creatinine, Ser: 1.01 mg/dL (ref 0.61–1.24)
GFR, Estimated: >60 mL/min (ref >60)
Glucose, Bld: 121 mg/dL — ABNORMAL HIGH (ref 70–99)
Potassium: 3.8 mmol/L (ref 3.5–5.1)
Sodium: 134 mmol/L — ABNORMAL LOW (ref 135–145)
Total Bilirubin: 1.6 mg/dL — ABNORMAL HIGH (ref 0.3–1.2)
Total Protein: 7 g/dL (ref 6.5–8.1)

## 2021-02-03 LAB — RESP PANEL BY RT-PCR (FLU A&B, COVID) ARPGX2
Influenza A by PCR: NEGATIVE
Influenza B by PCR: NEGATIVE
SARS Coronavirus 2 by RT PCR: NEGATIVE

## 2021-02-03 LAB — BRAIN NATRIURETIC PEPTIDE: B Natriuretic Peptide: 103 pg/mL — ABNORMAL HIGH (ref 0.0–100.0)

## 2021-02-03 LAB — D-DIMER, QUANTITATIVE: D-Dimer, Quant: 1.13 ug/mL-FEU — ABNORMAL HIGH (ref 0.00–0.50)

## 2021-02-03 LAB — APTT: aPTT: 33 seconds (ref 24–36)

## 2021-02-03 LAB — LACTIC ACID, PLASMA: Lactic Acid, Venous: 1.1 mmol/L (ref 0.5–1.9)

## 2021-02-03 MED ORDER — METHYLPREDNISOLONE SODIUM SUCC 125 MG IJ SOLR
125.0000 mg | Freq: Once | INTRAMUSCULAR | Status: AC
  Administered 2021-02-03: 125 mg via INTRAVENOUS
  Filled 2021-02-03 (×2): qty 2

## 2021-02-03 MED ORDER — ALBUTEROL (5 MG/ML) CONTINUOUS INHALATION SOLN
20.0000 mg/h | INHALATION_SOLUTION | Freq: Once | RESPIRATORY_TRACT | Status: AC
  Administered 2021-02-03: 20 mg/h via RESPIRATORY_TRACT
  Filled 2021-02-03: qty 20

## 2021-02-03 MED ORDER — LACTATED RINGERS IV BOLUS
1000.0000 mL | Freq: Once | INTRAVENOUS | Status: AC
  Administered 2021-02-03: 1000 mL via INTRAVENOUS

## 2021-02-03 MED ORDER — IPRATROPIUM-ALBUTEROL 0.5-2.5 (3) MG/3ML IN SOLN
3.0000 mL | RESPIRATORY_TRACT | Status: AC
  Administered 2021-02-03 (×3): 3 mL via RESPIRATORY_TRACT
  Filled 2021-02-03 (×2): qty 6

## 2021-02-03 MED ORDER — DOXYCYCLINE HYCLATE 100 MG PO TABS
100.0000 mg | ORAL_TABLET | Freq: Once | ORAL | Status: AC
  Administered 2021-02-03: 100 mg via ORAL
  Filled 2021-02-03: qty 1

## 2021-02-03 MED ORDER — ACETAMINOPHEN 500 MG PO TABS
1000.0000 mg | ORAL_TABLET | Freq: Once | ORAL | Status: AC
  Administered 2021-02-03: 1000 mg via ORAL
  Filled 2021-02-03: qty 2

## 2021-02-03 NOTE — ED Triage Notes (Signed)
The pt is c/o chest pain and diarrhea for 2-3 days temp no previous history of the same

## 2021-02-03 NOTE — ED Provider Notes (Signed)
[facility] EMERGENCY DEPARTMENT Provider Note   CSN: [id number] Arrival date & time: 02/03/21  1629     History Chief Complaint  Patient presents with  . Chest Pain    [name] is a 65 y.o. male.  FeverPatient declines shortness of breath cough fatigue, having some pain with breathing.  Worse.  He is having diarrhea.  There is been going on for 4 days.  Took a COVID test that was negative at home.  Has a history of hypertension and high cholesterol and trying to make dietary changes.  He took a home COVID test that was negative.  He is tried no other medications nothing made it better.  He has a cigarette smoking history but no diagnosis of COPD.        Past Medical History:  Diagnosis Date  . Headache   . HTN (hypertension)   . Hyperlipidemia     There are no problems to display for this patient.   Past Surgical History:  Procedure Laterality Date  . gallbladder removed         No family history on file.  Social History   Tobacco Use  . Smoking status: Current Every Day Smoker    Types: Cigarettes  . Smokeless tobacco: Never Used  Vaping Use  . Vaping Use: Never used  Substance Use Topics  . Alcohol use: Yes    Comment: occasional  . Drug use: Never    Home Medications Prior to Admission medications   Medication Sig Start Date End Date Taking? Authorizing Provider  amlodipine-olmesartan (AZOR) 10-20 MG tablet Take 1 tablet by mouth daily. 07/10/18   [provider]  famotidine (PEPCID) 20 MG tablet 1 tablet 12/28/19   [provider]  lovastatin (MEVACOR) 40 MG tablet Take 40 mg by mouth daily. with food 07/16/18   [provider]    Allergies    Patient has no known allergies.  Review of Systems   Review of Systems  Constitutional: Positive for chills and fever.  HENT: Positive for congestion. Negative for rhinorrhea.   Respiratory: Positive for cough, chest tightness and shortness of breath.    Cardiovascular: Negative for chest pain and palpitations.  Gastrointestinal: Positive for diarrhea and nausea. Negative for vomiting.  Genitourinary: Negative for difficulty urinating and dysuria.  Musculoskeletal: Negative for arthralgias and back pain.  Skin: Negative for color change and rash.  Neurological: Negative for light-headedness and headaches.    Physical Exam Updated Vital Signs BP (!) 106/51   Pulse 84   Temp (!) 100.6 F (38.1 C) (Oral)   Resp (!) 22   Ht 5\' 11"  (1.803 m)   Wt 116.6 kg   SpO2 97%   BMI 35.85 kg/m   Physical Exam Vitals and nursing note reviewed.  Constitutional:      General: He is not in acute distress.    Appearance: Normal appearance.  HENT:     Head: Normocephalic and atraumatic.     Nose: No rhinorrhea.  Eyes:     General:        Right eye: No discharge.        Left eye: No discharge.     Conjunctiva/sclera: Conjunctivae normal.  Cardiovascular:     Rate and Rhythm: Normal rate and regular rhythm.  Pulmonary:     Effort: Pulmonary effort is normal. Tachypnea present.     Breath sounds: No stridor. Decreased breath sounds and wheezing present.  Abdominal:  General: Abdomen is flat. There is no distension.     Palpations: Abdomen is soft.  Musculoskeletal:        General: No deformity or signs of injury.     Right lower leg: No edema.     Left lower leg: No edema.  Skin:    General: Skin is warm and dry.  Neurological:     General: No focal deficit present.     Mental Status: He is alert and oriented to person, place, and time. Mental status is at baseline.     Motor: No weakness.  Psychiatric:        Mood and Affect: Mood normal.        Behavior: Behavior normal.        Thought Content: Thought content normal.     ED Results / Procedures / Treatments   Labs (all labs ordered are listed, but only abnormal results are displayed) Labs Reviewed  CBC WITH DIFFERENTIAL/PLATELET - Abnormal; Notable for the following  components:      Result Value   WBC 11.9 (*)    MCH 34.5 (*)    Neutro Abs 9.7 (*)    All other components within normal limits  COMPREHENSIVE METABOLIC PANEL - Abnormal; Notable for the following components:   Sodium 134 (*)    Glucose, Bld 121 (*)    Total Bilirubin 1.6 (*)    All other components within normal limits  BRAIN NATRIURETIC PEPTIDE - Abnormal; Notable for the following components:   B Natriuretic Peptide 103.0 (*)    All other components within normal limits  URINALYSIS, ROUTINE W REFLEX MICROSCOPIC - Abnormal; Notable for the following components:   Color, Urine AMBER (*)    Hgb urine dipstick SMALL (*)    Protein, ur 30 (*)    All other components within normal limits  D-DIMER, QUANTITATIVE - Abnormal; Notable for the following components:   D-Dimer, Quant 1.13 (*)    All other components within normal limits  RESP PANEL BY RT-PCR (FLU A&B, COVID) ARPGX2  CULTURE, BLOOD (ROUTINE X 2)  CULTURE, BLOOD (ROUTINE X 2)  URINE CULTURE  LACTIC ACID, PLASMA  PROTIME-INR  APTT  TROPONIN I (HIGH SENSITIVITY)  TROPONIN I (HIGH SENSITIVITY)    EKG EKG Interpretation  Date/Time:  Friday Feb 03 2021 16:58:00 EDT Ventricular Rate:  98 PR Interval:  140 QRS Duration: 84 QT Interval:  298 QTC Calculation: 380 R Axis:   61 Text Interpretation: Normal sinus rhythm Normal ECG No previous ECGs available Confirmed by Theotis Burrow [phone]) on 02/03/2021 7:02:04 PM   Radiology DG Chest Port 1 View  Result Date: 02/03/2021 CLINICAL DATA:  Chest pain and diarrhea EXAM: PORTABLE CHEST 1 VIEW COMPARISON:  07/17/2007 FINDINGS: The heart size and mediastinal contours are within normal limits. Both lungs are clear. The visualized skeletal structures are unremarkable. IMPRESSION: No active disease. Electronically Signed   By: Donavan Foil M.D.   On: 02/03/2021 19:08    Procedures Procedures   Medications Ordered in ED Medications  lactated ringers bolus 1,000 mL (0 mLs  Intravenous Stopped 02/03/21 2127)  ipratropium-albuterol (DUONEB) 0.5-2.5 (3) MG/3ML nebulizer solution 3 mL (3 mLs Nebulization Given 02/03/21 2127)  acetaminophen (TYLENOL) tablet 1,000 mg (1,000 mg Oral Given 02/03/21 2050)  methylPREDNISolone sodium succinate (SOLU-MEDROL) 125 mg/2 mL injection 125 mg (125 mg Intravenous Given 02/03/21 2211)  lactated ringers bolus 1,000 mL (1,000 mLs Intravenous New Bag/Given 02/03/21 2212)  doxycycline (VIBRA-TABS) tablet 100 mg (100 mg Oral  Given 02/03/21 2212)  albuterol (PROVENTIL,VENTOLIN) solution continuous neb (20 mg/hr Nebulization Given 02/03/21 2251)    ED Course  I have reviewed the triage vital signs and the nursing notes.  Pertinent labs & imaging results that were available during my care of the patient were reviewed by me and considered in my medical decision making (see chart for details).    MDM Rules/Calculators/A&P                          Concerns for infectious etiology likely respiratory, possibly viral.  Will send COVID test.  We will get screening labs.  Has a leukocytosis but no lactic acidosis.  Chest x-ray is fairly unremarkable for focal infiltrate.  Does not appear volume overloaded but is very tight with his breathing and moving little air with wheeze.  I have concerned she has undiagnosed COPD relative to cigarette smoking and breathing treatments will be given.  Fluids will be given as her blood pressure is soft.  He is mentating normally.    Patient received breathing treatments and feels as though it has helped however he still tachypneic.  Steroids are given as I feel he may be some undiagnosed COPD.  The family then elicited a tick bite.  The wife even brought to take him to show me.  It appears based on Internet search to be a [employer] tick.  Potentially could be causing tickborne illness causing fever.  Also my expertise and tick identification is not I think adequate enough to say could not be something like RMSF causing  fevers.  However seems more likely to be consistent with general viral illness.  Flu COVID-negative.  Doxycycline will be given as this could be COPD and doxycycline is likely to help as well as tickborne illness.  Patient is given additional breathing treatments for symptomatic relief tachypnea and shortness of breath.  He will be admitted to the hospital for further evaluations cultures of blood and urine have been sent.  Patient's blood pressure responded well to IV fluids.  He is resting comfortably no tachypnea.  D-dimer was elevated.  and given the global shortage of IV contrast recommend his medicine team to do VQ scan instead of CT angiogram.  The patient will be admitted to the hospitalist.  For the remainder this patient's care please see inpatient team notes.  I will intervene as needed while the patient remains in the emergency department.   Final Clinical Impression(s) / ED Diagnoses Final diagnoses:  Fever in adult  Cough  SOB (shortness of breath)    Rx / DC Orders ED Discharge Orders    None       Breck Coons, MD 02/03/21 2314

## 2021-02-03 NOTE — ED Provider Notes (Signed)
Emergency Medicine Provider Triage Evaluation Note  [name] , Moreno 65 y.o. male  was evaluated in triage.  Pt complains of shortness of breath.  Began 2 days ago.  Worse when he exerts himself and lies flat at night.  He denies any prior history of heart failure.  He denies any lower extremity edema.  He has cough which is nonproductive.  He is vaccinated for COVID.  No recent sick contacts.  Does have some chest tightness.  He felt warm however is not taken his temperature.  Review of Systems  Positive: Chest tightness, shortness of breath, cough Negative: Nausea, Vomiting, abdominal pain numbness  Physical Exam  BP 137/80 (BP Location: Left Arm)   Pulse 98   Temp (!) 100.6 F (38.1 C) (Oral)   Resp (!) 24   SpO2 93%  Gen:   Awake, no distress   Resp:  Increased work of breathing MSK:   Moves extremities without difficulty  Other:    Medical Decision Making  Medically screening exam initiated at 4:57 PM.  Appropriate orders placed.  [name] was informed that the remainder of the evaluation will be completed by another provider, this initial triage assessment does not replace that evaluation, and the importance of remaining in the ED until their evaluation is complete.  Chest pain, shortness of breath, cough  Patient febrile, tachycardic, increased work of breathing. Will be next for room in back   Jerry Hoehn A, PA-C 02/03/21 1657    Jerry Coons, MD 02/03/21 [phone]

## 2021-02-04 ENCOUNTER — Observation Stay (HOSPITAL_COMMUNITY): Payer: 59

## 2021-02-04 ENCOUNTER — Encounter (HOSPITAL_COMMUNITY): Payer: Self-pay | Admitting: Internal Medicine

## 2021-02-04 DIAGNOSIS — Z6834 Body mass index (BMI) 34.0-34.9, adult: Secondary | ICD-10-CM

## 2021-02-04 DIAGNOSIS — I350 Nonrheumatic aortic (valve) stenosis: Secondary | ICD-10-CM

## 2021-02-04 DIAGNOSIS — I2699 Other pulmonary embolism without acute cor pulmonale: Secondary | ICD-10-CM | POA: Diagnosis not present

## 2021-02-04 DIAGNOSIS — R072 Precordial pain: Secondary | ICD-10-CM | POA: Diagnosis not present

## 2021-02-04 DIAGNOSIS — R079 Chest pain, unspecified: Secondary | ICD-10-CM

## 2021-02-04 DIAGNOSIS — R197 Diarrhea, unspecified: Secondary | ICD-10-CM | POA: Diagnosis present

## 2021-02-04 DIAGNOSIS — R0602 Shortness of breath: Secondary | ICD-10-CM | POA: Diagnosis not present

## 2021-02-04 DIAGNOSIS — I10 Essential (primary) hypertension: Secondary | ICD-10-CM | POA: Diagnosis present

## 2021-02-04 DIAGNOSIS — R509 Fever, unspecified: Secondary | ICD-10-CM

## 2021-02-04 DIAGNOSIS — E785 Hyperlipidemia, unspecified: Secondary | ICD-10-CM | POA: Diagnosis present

## 2021-02-04 LAB — RESPIRATORY PANEL BY PCR

## 2021-02-04 LAB — CBC WITH DIFFERENTIAL/PLATELET
Abs Immature Granulocytes: 0.06 10*3/uL (ref 0.00–0.07)
Basophils Absolute: 0 10*3/uL (ref 0.0–0.1)
Basophils Relative: 0 %
Eosinophils Absolute: 0 10*3/uL (ref 0.0–0.5)
Eosinophils Relative: 0 %
HCT: 40.9 % (ref 39.0–52.0)
Hemoglobin: 14 g/dL (ref 13.0–17.0)
Immature Granulocytes: 1 %
Lymphocytes Relative: 4 %
Lymphs Abs: 0.4 10*3/uL — ABNORMAL LOW (ref 0.7–4.0)
MCH: 34.7 pg — ABNORMAL HIGH (ref 26.0–34.0)
MCHC: 34.2 g/dL (ref 30.0–36.0)
MCV: 101.2 fL — ABNORMAL HIGH (ref 80.0–100.0)
Monocytes Absolute: 0.5 10*3/uL (ref 0.1–1.0)
Monocytes Relative: 4 %
Neutro Abs: 10.2 10*3/uL — ABNORMAL HIGH (ref 1.7–7.7)
Neutrophils Relative %: 91 %
Platelets: 151 10*3/uL (ref 150–400)
RBC: 4.04 MIL/uL — ABNORMAL LOW (ref 4.22–5.81)
RDW: 12.9 % (ref 11.5–15.5)
WBC: 11.2 10*3/uL — ABNORMAL HIGH (ref 4.0–10.5)
nRBC: 0 % (ref 0.0–0.2)

## 2021-02-04 LAB — COMPREHENSIVE METABOLIC PANEL
ALT: 29 U/L (ref 0–44)
AST: 42 U/L — ABNORMAL HIGH (ref 15–41)
Albumin: 3.2 g/dL — ABNORMAL LOW (ref 3.5–5.0)
Alkaline Phosphatase: 78 U/L (ref 38–126)
Anion gap: 14 (ref 5–15)
BUN: 13 mg/dL (ref 8–23)
CO2: 18 mmol/L — ABNORMAL LOW (ref 22–32)
Calcium: 9.9 mg/dL (ref 8.9–10.3)
Chloride: 101 mmol/L (ref 98–111)
Creatinine, Ser: 1.38 mg/dL — ABNORMAL HIGH (ref 0.61–1.24)
GFR, Estimated: 56 mL/min — ABNORMAL LOW (ref >60)
Glucose, Bld: 245 mg/dL — ABNORMAL HIGH (ref 70–99)
Potassium: 3.2 mmol/L — ABNORMAL LOW (ref 3.5–5.1)
Sodium: 133 mmol/L — ABNORMAL LOW (ref 135–145)
Total Bilirubin: 0.9 mg/dL (ref 0.3–1.2)
Total Protein: 6.3 g/dL — ABNORMAL LOW (ref 6.5–8.1)

## 2021-02-04 LAB — TROPONIN I (HIGH SENSITIVITY)
Troponin I (High Sensitivity): 216 ng/L (ref <18)
Troponin I (High Sensitivity): 634 ng/L (ref <18)
Troponin I (High Sensitivity): 2883 ng/L (ref <18)
Troponin I (High Sensitivity): 3228 ng/L (ref <18)
Troponin I (High Sensitivity): 2610 ng/L (ref <18)
Troponin I (High Sensitivity): 2373 ng/L (ref <18)

## 2021-02-04 LAB — ECHOCARDIOGRAM COMPLETE
AR max vel: 1.17 cm2
AV Area VTI: 1.28 cm2
AV Area mean vel: 1.16 cm2
AV Mean grad: 25.8 mmHg
AV Peak grad: 45.2 mmHg
Ao pk vel: 3.36 m/s
Area-P 1/2: 2.96 cm2
Height: 71 in
S' Lateral: 4 cm
Weight: 3948.8 oz

## 2021-02-04 LAB — HEPARIN LEVEL (UNFRACTIONATED): Heparin Unfractionated: <0.1 IU/mL — ABNORMAL LOW (ref 0.30–0.70)

## 2021-02-04 LAB — PROCALCITONIN: Procalcitonin: 0.96 ng/mL

## 2021-02-04 LAB — HIV ANTIBODY (ROUTINE TESTING W REFLEX): HIV Screen 4th Generation wRfx: NONREACTIVE

## 2021-02-04 MED ORDER — ASPIRIN 81 MG PO CHEW
81.0000 mg | CHEWABLE_TABLET | Freq: Every day | ORAL | Status: DC
  Administered 2021-02-04 – 2021-02-08 (×4): 81 mg via ORAL
  Filled 2021-02-04 (×5): qty 1

## 2021-02-04 MED ORDER — NITROGLYCERIN 0.4 MG SL SUBL
0.4000 mg | SUBLINGUAL_TABLET | SUBLINGUAL | Status: DC | PRN
  Administered 2021-02-06: 0.4 mg via SUBLINGUAL
  Filled 2021-02-04: qty 1

## 2021-02-04 MED ORDER — IRBESARTAN 150 MG PO TABS
150.0000 mg | ORAL_TABLET | Freq: Every day | ORAL | Status: DC
  Administered 2021-02-04: 150 mg via ORAL
  Filled 2021-02-04 (×2): qty 1

## 2021-02-04 MED ORDER — NITROGLYCERIN 0.4 MG SL SUBL
SUBLINGUAL_TABLET | SUBLINGUAL | Status: AC
  Administered 2021-02-04: 0.4 mg via SUBLINGUAL
  Filled 2021-02-04: qty 1

## 2021-02-04 MED ORDER — AMLODIPINE BESYLATE 10 MG PO TABS
10.0000 mg | ORAL_TABLET | Freq: Every day | ORAL | Status: DC
  Administered 2021-02-04: 10 mg via ORAL
  Filled 2021-02-04: qty 1

## 2021-02-04 MED ORDER — SODIUM CHLORIDE 0.9 % IV SOLN
100.0000 mg | Freq: Two times a day (BID) | INTRAVENOUS | Status: DC
  Administered 2021-02-04 – 2021-02-07 (×8): 100 mg via INTRAVENOUS
  Filled 2021-02-04 (×13): qty 100

## 2021-02-04 MED ORDER — ASPIRIN 325 MG PO TABS
ORAL_TABLET | ORAL | Status: AC
  Filled 2021-02-04: qty 1

## 2021-02-04 MED ORDER — IOHEXOL 350 MG/ML SOLN
52.0000 mL | Freq: Once | INTRAVENOUS | Status: AC | PRN
  Administered 2021-02-04: 52 mL via INTRAVENOUS

## 2021-02-04 MED ORDER — ACETAMINOPHEN 325 MG PO TABS
650.0000 mg | ORAL_TABLET | Freq: Four times a day (QID) | ORAL | Status: DC | PRN
  Administered 2021-02-08: 650 mg via ORAL
  Filled 2021-02-04: qty 2

## 2021-02-04 MED ORDER — AMLODIPINE-OLMESARTAN 10-20 MG PO TABS: 1.0000 | ORAL_TABLET | Freq: Every day | ORAL | Status: DC

## 2021-02-04 MED ORDER — ASPIRIN EC 325 MG PO TBEC
325.0000 mg | DELAYED_RELEASE_TABLET | Freq: Once | ORAL | Status: AC
  Administered 2021-02-04: 325 mg via ORAL
  Filled 2021-02-04: qty 1

## 2021-02-04 MED ORDER — POTASSIUM CHLORIDE CRYS ER 20 MEQ PO TBCR
40.0000 meq | EXTENDED_RELEASE_TABLET | Freq: Once | ORAL | Status: AC
  Administered 2021-02-04: 40 meq via ORAL
  Filled 2021-02-04: qty 2

## 2021-02-04 MED ORDER — ENOXAPARIN SODIUM 40 MG/0.4ML IJ SOSY: 40.0000 mg | PREFILLED_SYRINGE | INTRAMUSCULAR | Status: DC

## 2021-02-04 MED ORDER — NICOTINE 21 MG/24HR TD PT24
21.0000 mg | MEDICATED_PATCH | Freq: Every day | TRANSDERMAL | Status: DC
  Administered 2021-02-04 – 2021-02-07 (×4): 21 mg via TRANSDERMAL
  Filled 2021-02-04 (×5): qty 1

## 2021-02-04 MED ORDER — HEPARIN BOLUS VIA INFUSION
2000.0000 [IU] | Freq: Once | INTRAVENOUS | Status: AC
  Administered 2021-02-04: 2000 [IU] via INTRAVENOUS
  Filled 2021-02-04: qty 2000

## 2021-02-04 MED ORDER — HEPARIN BOLUS VIA INFUSION
4000.0000 [IU] | Freq: Once | INTRAVENOUS | Status: AC
  Administered 2021-02-04: 4000 [IU] via INTRAVENOUS
  Filled 2021-02-04: qty 4000

## 2021-02-04 MED ORDER — PRAVASTATIN SODIUM 40 MG PO TABS
40.0000 mg | ORAL_TABLET | Freq: Every day | ORAL | Status: DC
  Administered 2021-02-04: 40 mg via ORAL
  Filled 2021-02-04: qty 1

## 2021-02-04 MED ORDER — FAMOTIDINE 20 MG PO TABS
20.0000 mg | ORAL_TABLET | Freq: Every day | ORAL | Status: DC
  Administered 2021-02-04 – 2021-02-08 (×5): 20 mg via ORAL
  Filled 2021-02-04 (×5): qty 1

## 2021-02-04 MED ORDER — ACETAMINOPHEN 650 MG RE SUPP: 650.0000 mg | Freq: Four times a day (QID) | RECTAL | Status: DC | PRN

## 2021-02-04 MED ORDER — HEPARIN (PORCINE) 25000 UT/250ML-% IV SOLN
2300.0000 [IU]/h | INTRAVENOUS | Status: DC
  Administered 2021-02-04: 1300 [IU]/h via INTRAVENOUS
  Administered 2021-02-05: 2200 [IU]/h via INTRAVENOUS
  Administered 2021-02-06 – 2021-02-07 (×4): 2300 [IU]/h via INTRAVENOUS
  Filled 2021-02-04 (×7): qty 250

## 2021-02-04 MED ORDER — POTASSIUM CHLORIDE CRYS ER 20 MEQ PO TBCR: 40.0000 meq | EXTENDED_RELEASE_TABLET | Freq: Two times a day (BID) | ORAL | Status: DC

## 2021-02-04 NOTE — Progress Notes (Signed)
ANTICOAGULATION CONSULT NOTE - Initial Consult  Pharmacy Consult for Heparin Indication: chest pain/ACS  No Known Allergies  Patient Measurements: Height: 5\' 11"  (180.3 cm) Weight: 111.9 kg (246 lb 12.8 oz) IBW/kg (Calculated) : 75.3 kg Heparin Dosing Weight: 99.5 kg  Vital Signs: Temp: 97.8 F (36.6 C) (05/28 0616) Temp Source: Oral (05/28 0616) BP: 107/79 (05/28 1100) Pulse Rate: 89 (05/28 0537)  Labs: Recent Labs    02/03/21 1654 02/03/21 1939 02/04/21 0305 02/04/21 0506 02/04/21 0954  HGB 15.1  --  14.0  --   --   HCT 43.2  --  40.9  --   --   PLT 150  --  151  --   --   APTT  --  33  --   --   --   LABPROT  --  14.6  --   --   --   INR  --  1.1  --   --   --   CREATININE 1.01  --  1.38*  --   --   TROPONINIHS 12 14 216* 634* 2,883*    Estimated Creatinine Clearance: 67 mL/min (A) (by C-G formula based on SCr of 1.38 mg/dL (H)).   Medical History: Past Medical History:  Diagnosis Date  . BMI 34.0-34.9,adult   . Headache   . HTN (hypertension)   . Hyperlipidemia     Assessment: 65 yo male with a past medical history of vascular disease, hypertension, hyperlipidemia, and tobacco abuse presents with chest pain. PTA the patient is not on anticoagulation. Pharmacy is consulted to dose heparin.  Goal of Therapy:  Heparin level 0.3-0.7 units/ml Monitor platelets by anticoagulation protocol: Yes   Plan:  -Initiate heparin IV 4000 units bolus x1 dose then heparin IV at 1300 units/hr -Obtain a 6-hr heparin level -Monitor a daily heparin level and CBC -Monitor for signs and symptoms of bleeding  Shauna Hugh, PharmD, [place]  PGY-1 Pharmacy Resident 02/04/2021 12:18 PM  Please check [email or web address] for unit-specific pharmacy phone numbers.

## 2021-02-04 NOTE — Progress Notes (Signed)
  Echocardiogram 2D Echocardiogram has been performed.  [name] F 02/04/2021, 11:24 AM

## 2021-02-04 NOTE — Progress Notes (Addendum)
PROGRESS NOTE    [name]  VCB:[id number] DOB: 1956-03-18 DOA: 02/03/2021 PCP: Orpah Melter, MD  [id number]   Assessment & Plan:   Principal Problem:   Chest pain Active Problems:   Acute bronchitis   Fever in adult   Diarrhea   Essential hypertension   Hyperlipidemia   HTN (hypertension)   BMI 34.0-34.9,adult   [name] is a 65 y.o. male with history of hypertension, hyperlipidemia, vascular disease, tobacco abuse presents to the ER with complaints of having chest pain with nonproductive cough and diarrhea.  Patient symptoms have been present for the last 3 days.  Chest pain is retrosternal pressure-like increased on exertion.  Patient's cough has been nonproductive.  Denies any recent sick contacts travel.  And diarrhea has been watery.  At least 3 episodes today.  No recent travel or sick contacts.  Denies using any antibiotics.  Patient also had a tick found on the body.  Recently patient has been having right shoulder pain secondary rotator cuff and has been receiving pain medication for the last 1 month.   Chest pain Troponin elevation  -history of vascular disease with history of tobacco abuse hypertension hyperlipidemia  --troponin trending up to 3000's --D-dimer elevated --cardiology consulted on admission Plan: --start heparin gtt --CTA chest today, neg for PE through the proximal segmental pulmonary arterial level. --trend trop --Echo today, no wall motion  Cough with fever cause not clear --not hypoxic --COVID neg, RVP neg. --obtain procal  Diarrhea, improved -has not been on any antibiotics recently.  Denies any recent travel or sick contacts.   --obtain C diff and GI path  Tick bite --tick-born illness titers ordered on admission.  Started on empiric doxy on admission --cont doxy for now  Hypertension --Hold home BP meds since BP intermittently low  Hyperlipidemia --cont statin  Hypokalemia --replete with oral  potassium  Current smoker --nicotine patch  aortic atherosclerosis -continue aspirin and statin.   DVT prophylaxis: BW:[id number] gtt Code Status: Full code  Family Communication: wife updated at bedside today  Level of care: Telemetry Cardiac Dispo:   The patient is from: home Anticipated d/c is to: home Anticipated d/c date is: 2-3 days Patient currently is not medically ready to d/c due to: cardiac workup pending   Subjective and Interval History:  Dyspnea and chest tightness improved.  Diarrhea improved.  No LE pain or swelling.    Trop trending up, started on heparin gtt.  CTA chest neg for PE through the proximal segmental pulmonary arterial level.   Objective: Vitals:   02/04/21 0616 02/04/21 0630 02/04/21 1100 02/04/21 1600  BP: 113/65  107/79 118/67  Pulse:    79  Resp: 18  20 20   Temp: 97.8 F (36.6 C)   97.8 F (36.6 C)  TempSrc: Oral   Oral  SpO2: 94%   98%  Weight:  111.9 kg    Height:  5\' 11"  (1.803 m)      Intake/Output Summary (Last 24 hours) at 02/04/2021 1911 Last data filed at 02/04/2021 1500 Gross per 24 hour  Intake 2791.41 ml  Output --  Net 2791.41 ml   Filed Weights   02/03/21 1714 02/04/21 0630  Weight: 116.6 kg 111.9 kg    Examination:   Constitutional: NAD, AAOx3 HEENT: conjunctivae and lids normal, EOMI CV: No cyanosis.   RESP: normal respiratory effort, reduced lung sounds, on RA Extremities: No effusions, edema in BLE SKIN: warm, dry Neuro: II - XII grossly intact.  Psych: Normal mood and affect.  Appropriate judgement and reason   Data Reviewed: I have personally reviewed following labs and imaging studies  CBC: Recent Labs  Lab 02/03/21 1654 02/04/21 0305  WBC 11.9* 11.2*  NEUTROABS 9.7* 10.2*  HGB 15.1 14.0  HCT 43.2 40.9  MCV 98.6 101.2*  PLT 150 932   Basic Metabolic Panel: Recent Labs  Lab 02/03/21 1654 02/04/21 0305  NA 134* 133*  K 3.8 3.2*  CL 104 101  CO2 23 18*  GLUCOSE 121* 245*  BUN 11 13   CREATININE 1.01 1.38*  CALCIUM 10.3 9.9   GFR: Estimated Creatinine Clearance: 67 mL/min (A) (by C-G formula based on SCr of 1.38 mg/dL (H)). Liver Function Tests: Recent Labs  Lab 02/03/21 1654 02/04/21 0305  AST 21 42*  ALT 26 29  ALKPHOS 83 78  BILITOT 1.6* 0.9  PROT 7.0 6.3*  ALBUMIN 3.8 3.2*   No results for input(s): LIPASE, AMYLASE in the last 168 hours. No results for input(s): AMMONIA in the last 168 hours. Coagulation Profile: Recent Labs  Lab 02/03/21 1939  INR 1.1   Cardiac Enzymes: No results for input(s): CKTOTAL, CKMB, CKMBINDEX, TROPONINI in the last 168 hours. BNP (last 3 results) No results for input(s): PROBNP in the last 8760 hours. HbA1C: No results for input(s): HGBA1C in the last 72 hours. CBG: No results for input(s): GLUCAP in the last 168 hours. Lipid Profile: No results for input(s): CHOL, HDL, LDLCALC, TRIG, CHOLHDL, LDLDIRECT in the last 72 hours. Thyroid Function Tests: No results for input(s): TSH, T4TOTAL, FREET4, T3FREE, THYROIDAB in the last 72 hours. Anemia Panel: No results for input(s): VITAMINB12, FOLATE, FERRITIN, TIBC, IRON, RETICCTPCT in the last 72 hours. Sepsis Labs: Recent Labs  Lab 02/03/21 1654  LATICACIDVEN 1.1    Recent Results (from the past 240 hour(s))  Blood culture (routine x 2)     Status: None (Preliminary result)   Collection Time: 02/03/21  4:57 PM   Specimen: BLOOD  Result Value Ref Range Status   Specimen Description BLOOD LEFT ANTECUBITAL  Final   Special Requests   Final    BOTTLES DRAWN AEROBIC AND ANAEROBIC Blood Culture adequate volume   Culture   Final    NO GROWTH < 24 HOURS Performed at [facility], [place] [address], [place], [place] [address]    Report Status PENDING  Incomplete  Blood culture (routine x 2)     Status: None (Preliminary result)   Collection Time: 02/03/21  8:10 PM   Specimen: BLOOD  Result Value Ref Range Status   Specimen Description BLOOD  Final   Special  Requests NONE  Final   Culture   Final    NO GROWTH < 12 HOURS Performed at [facility], [place] [address] [place], [place] [address]    Report Status PENDING  Incomplete  Resp Panel by RT-PCR (Flu A&B, Covid) Nasopharyngeal Swab     Status: None   Collection Time: 02/03/21  9:20 PM   Specimen: Nasopharyngeal Swab; Nasopharyngeal(NP) swabs in vial transport medium  Result Value Ref Range Status   SARS Coronavirus 2 by RT PCR NEGATIVE NEGATIVE Final    Comment: (NOTE) SARS-CoV-2 target nucleic acids are NOT DETECTED.  The SARS-CoV-2 RNA is generally detectable in upper respiratory specimens during the acute phase of infection. The lowest concentration of SARS-CoV-2 viral copies this assay can detect is 138 copies/mL. A negative result does not preclude SARS-Cov-2 infection and should not be  used as the sole basis for treatment or other patient management decisions. A negative result may occur with  improper specimen collection/handling, submission of specimen other than nasopharyngeal swab, presence of viral mutation(s) within the areas targeted by this assay, and inadequate number of viral copies(<138 copies/mL). A negative result must be combined with clinical observations, patient history, and epidemiological information. The expected result is Negative.  Fact Sheet for Patients:  EntrepreneurPulse.com.au  Fact Sheet for Healthcare Providers:  IncredibleEmployment.be  This test is no t yet approved or cleared by the [place] FDA and  has been authorized for detection and/or diagnosis of SARS-CoV-2 by FDA under an Emergency Use Authorization (EUA). This EUA will remain  in effect (meaning this test can be used) for the duration of the COVID-19 declaration under Section 564(b)(1) of the Act, 21 U.S.C.section 360bbb-3(b)(1), unless the authorization is terminated  or revoked sooner.       Influenza A by PCR NEGATIVE NEGATIVE Final    Influenza B by PCR NEGATIVE NEGATIVE Final    Comment: (NOTE) The Xpert Xpress SARS-CoV-2/FLU/RSV plus assay is intended as an aid in the diagnosis of influenza from Nasopharyngeal swab specimens and should not be used as a sole basis for treatment. Nasal washings and aspirates are unacceptable for Xpert Xpress SARS-CoV-2/FLU/RSV testing.  Fact Sheet for Patients: EntrepreneurPulse.com.au  Fact Sheet for Healthcare Providers: IncredibleEmployment.be  This test is not yet approved or cleared by the [place] FDA and has been authorized for detection and/or diagnosis of SARS-CoV-2 by FDA under an Emergency Use Authorization (EUA). This EUA will remain in effect (meaning this test can be used) for the duration of the COVID-19 declaration under Section 564(b)(1) of the Act, 21 U.S.C. section 360bbb-3(b)(1), unless the authorization is terminated or revoked.  Performed at [facility], [place] [address], [place], [place] [address]   Respiratory (~20 pathogens) panel by PCR     Status: None   Collection Time: 02/04/21  5:44 AM   Specimen: Nasopharyngeal Swab; Respiratory  Result Value Ref Range Status   Adenovirus NOT DETECTED NOT DETECTED Final   Coronavirus 229E NOT DETECTED NOT DETECTED Final    Comment: (NOTE) The Coronavirus on the Respiratory Panel, DOES NOT test for the novel  Coronavirus (2019 nCoV)    Coronavirus HKU1 NOT DETECTED NOT DETECTED Final   Coronavirus NL63 NOT DETECTED NOT DETECTED Final   Coronavirus OC43 NOT DETECTED NOT DETECTED Final   Metapneumovirus NOT DETECTED NOT DETECTED Final   Rhinovirus / Enterovirus NOT DETECTED NOT DETECTED Final   Influenza A NOT DETECTED NOT DETECTED Final   Influenza B NOT DETECTED NOT DETECTED Final   Parainfluenza Virus 1 NOT DETECTED NOT DETECTED Final   Parainfluenza Virus 2 NOT DETECTED NOT DETECTED Final   Parainfluenza Virus 3 NOT DETECTED NOT DETECTED Final    Parainfluenza Virus 4 NOT DETECTED NOT DETECTED Final   Respiratory Syncytial Virus NOT DETECTED NOT DETECTED Final   Bordetella pertussis NOT DETECTED NOT DETECTED Final   Bordetella Parapertussis NOT DETECTED NOT DETECTED Final   Chlamydophila pneumoniae NOT DETECTED NOT DETECTED Final   Mycoplasma pneumoniae NOT DETECTED NOT DETECTED Final    Comment: Performed at [facility], [address] [place], [place] [address]      Radiology Studies: CT ANGIO CHEST PE W OR WO CONTRAST  Result Date: 02/04/2021 CLINICAL DATA:  Chest pain, shortness of breath, concern for PE EXAM: CT ANGIOGRAPHY CHEST WITH CONTRAST TECHNIQUE: Multidetector CT imaging of  the chest was performed using the standard protocol during bolus administration of intravenous contrast. Multiplanar CT image reconstructions and MIPs were obtained to evaluate the vascular anatomy. CONTRAST:  12mL OMNIPAQUE IOHEXOL 350 MG/ML SOLN COMPARISON:  None. FINDINGS: Cardiovascular: Examination for pulmonary embolism is limited by breath motion artifact. Within this limitation, no evidence of pulmonary embolism through the proximal segmental pulmonary arterial level. Three-vessel coronary artery calcification. No pericardial effusion. Severe, mixed aortic atherosclerosis. Mediastinum/Nodes: No enlarged mediastinal, hilar, or axillary lymph nodes. Thyroid gland, trachea, and esophagus demonstrate no significant findings. Lungs/Pleura: There is scattered heterogeneous and ground-glass airspace opacity throughout, for example in the dependent right lower lobe (series 9, image 61) and in the posterior left upper lobe (series 9, image 40). Evaluation of airspace disease is likewise somewhat limited by breath motion artifact. No pleural effusion or pneumothorax. Upper Abdomen: No acute abnormality. Musculoskeletal: No chest wall abnormality. No acute or significant osseous findings. Review of the MIP images confirms the above findings. IMPRESSION:  1. Examination for pulmonary embolism is limited by breath motion artifact. Within this limitation, no evidence of pulmonary embolism through the proximal segmental pulmonary arterial level. 2. Scattered heterogeneous and ground-glass airspace opacity throughout, for example in the dependent right lower lobe and in the posterior left upper lobe. Evaluation of airspace disease is likewise somewhat limited by breath motion artifact. Findings are consistent with multifocal infection or aspiration. 3. Coronary artery disease. Aortic Atherosclerosis (ICD10-I70.0). Electronically Signed   By: Eddie Candle M.D.   On: 02/04/2021 17:18   DG Chest Port 1 View  Result Date: 02/03/2021 CLINICAL DATA:  Chest pain and diarrhea EXAM: PORTABLE CHEST 1 VIEW COMPARISON:  07/17/2007 FINDINGS: The heart size and mediastinal contours are within normal limits. Both lungs are clear. The visualized skeletal structures are unremarkable. IMPRESSION: No active disease. Electronically Signed   By: Donavan Foil M.D.   On: 02/03/2021 19:08   ECHOCARDIOGRAM COMPLETE  Result Date: 02/04/2021    ECHOCARDIOGRAM REPORT   Patient Name:   AADITYA LETIZIA Date of Exam: 02/04/2021 Medical Rec #:  [id number]        Height:       71.0 in Accession #:    [id number]       Weight:       246.8 lb Date of Birth:  27-Sep-1955        BSA:          2.306 m Patient Age:    73 years         BP:           107/79 mmHg Patient Gender: M                HR:           87 bpm. Exam Location:  Inpatient Procedure: 2D Echo, Cardiac Doppler and Color Doppler Indications:    R07.9* Chest pain, unspecified  History:        Patient has prior history of Echocardiogram examinations, most                 recent 04/29/2020. Signs/Symptoms:Shortness of Breath. Coughing.  Sonographer:    Merrie Roof RDCS Referring Phys: [place]  1. Left ventricular ejection fraction, by estimation, is 60 to 65%. The left ventricle has normal function. The left  ventricle has no regional wall motion abnormalities. Left ventricular diastolic parameters are consistent with Grade I diastolic dysfunction (impaired relaxation). Elevated left atrial pressure.  2. Right ventricular  systolic function is normal. The right ventricular size is normal.  3. The mitral valve is normal in structure. No evidence of mitral valve regurgitation. No evidence of mitral stenosis.  4. The aortic valve has an indeterminant number of cusps. Aortic valve regurgitation is not visualized. Moderate aortic valve stenosis.  5. The inferior vena cava is normal in size with greater than 50% respiratory variability, suggesting right atrial pressure of 3 mmHg. FINDINGS  Left Ventricle: Left ventricular ejection fraction, by estimation, is 60 to 65%. The left ventricle has normal function. The left ventricle has no regional wall motion abnormalities. The left ventricular internal cavity size was normal in size. There is  no left ventricular hypertrophy. Left ventricular diastolic parameters are consistent with Grade I diastolic dysfunction (impaired relaxation). Elevated left atrial pressure. Right Ventricle: The right ventricular size is normal. Right ventricular systolic function is normal. Left Atrium: Left atrial size was normal in size. Right Atrium: Right atrial size was normal in size. Pericardium: There is no evidence of pericardial effusion. Mitral Valve: The mitral valve is normal in structure. Mild mitral annular calcification. No evidence of mitral valve regurgitation. No evidence of mitral valve stenosis. Tricuspid Valve: The tricuspid valve is normal in structure. Tricuspid valve regurgitation is trivial. No evidence of tricuspid stenosis. Aortic Valve: The aortic valve has an indeterminant number of cusps. Aortic valve regurgitation is not visualized. Moderate aortic stenosis is present. Aortic valve mean gradient measures 25.8 mmHg. Aortic valve peak gradient measures 45.2 mmHg. Aortic valve  area, by VTI measures 1.28 cm. Pulmonic Valve: The pulmonic valve was not well visualized. Pulmonic valve regurgitation is not visualized. No evidence of pulmonic stenosis. Aorta: The aortic root is normal in size and structure. Venous: The inferior vena cava is normal in size with greater than 50% respiratory variability, suggesting right atrial pressure of 3 mmHg.   LEFT VENTRICLE PLAX 2D LVIDd:         5.30 cm  Diastology LVIDs:         4.00 cm  LV e' medial:    9.46 cm/s LV PW:         1.00 cm  LV E/e' medial:  11.3 LV IVS:        1.20 cm  LV e' lateral:   6.31 cm/s LVOT diam:     2.10 cm  LV E/e' lateral: 17.0 LV SV:         84 LV SV Index:   37 LVOT Area:     3.46 cm  RIGHT VENTRICLE RV Basal diam:  3.90 cm LEFT ATRIUM           Index       RIGHT ATRIUM           Index LA diam:      3.00 cm 1.30 cm/m  RA Area:     24.60 cm LA Vol (A2C): 83.9 ml 36.39 ml/m RA Volume:   85.30 ml  37.00 ml/m LA Vol (A4C): 35.6 ml 15.44 ml/m  AORTIC VALVE AV Area (Vmax):    1.17 cm AV Area (Vmean):   1.16 cm AV Area (VTI):     1.28 cm AV Vmax:           336.20 cm/s AV Vmean:          237.200 cm/s AV VTI:            0.658 m AV Peak Grad:      45.2 mmHg AV Mean Grad:  25.8 mmHg LVOT Vmax:         114.00 cm/s LVOT Vmean:        79.550 cm/s LVOT VTI:          0.243 m LVOT/AV VTI ratio: 0.37  AORTA Ao Root diam: 3.70 cm MITRAL VALVE MV Area (PHT): 2.96 cm     SHUNTS MV Decel Time: 256 msec     Systemic VTI:  0.24 m MV E velocity: 107.00 cm/s  Systemic Diam: 2.10 cm MV A velocity: 116.00 cm/s MV E/A ratio:  0.92 [name] MD Electronically signed by [name] MD Signature Date/Time: 02/04/2021/12:00:26 PM    Final      Scheduled Meds: . amLODipine  10 mg Oral Daily  . aspirin  81 mg Oral Daily  . famotidine  20 mg Oral Daily  . nicotine  21 mg Transdermal Daily  . pravastatin  40 mg Oral q1800   Continuous Infusions: . doxycycline (VIBRAMYCIN) IV 100 mg (02/04/21 1121)  . heparin 1,300 Units/hr  (02/04/21 1258)     LOS: 0 days     Enzo Bi, MD [facility] If 7PM-7AM, please contact night-coverage 02/04/2021, 7:11 PM

## 2021-02-04 NOTE — H&P (Signed)
History and Physical    [name] [name] DOB: [birth year] DOA: 02/03/2021  PCP: Orpah Melter, MD  Patient coming from: Home.  Chief Complaint: Chest pain.  HPI: [name] is a 65 y.o. male with history of hypertension, hyperlipidemia, vascular disease, tobacco abuse presents to the ER with complaints of having chest pain with nonproductive cough and diarrhea.  Patient symptoms have been present for the last 3 days.  Chest pain is retrosternal pressure-like increased on exertion.  Patient's cough has been nonproductive.  Denies any recent sick contacts travel.  And diarrhea has been watery.  At least 3 episodes today.  No recent travel or sick contacts.  Denies using any antibiotics.  Patient also had a tick found on the body.  Recently patient has been having right shoulder pain secondary rotator cuff and has been receiving pain medication for the last 1 month.  ED Course: In the ER patient was febrile with temperature of 100.6 F with chest x-ray unremarkable.  UA is pending.  Patient was found to be mildly wheezing initially as per the ER physician and initial nebulizer helped the patient's shortness of breath but second does worsen the patient's chest tightness and has to be stopped.  Nitroglycerin improved patient chest pain from 6-3.  Blood cultures obtained.  Along with which also RMSF titer and Lyme titers were ordered because of tick found on the body.  High sensitive troponins were negative D-dimer was 1.13.  EKG shows normal sinus rhythm.  COVID test negative.  EKG does not show any signs of pericarditis.  Review of Systems: As per HPI, rest all negative.   Past Medical History:  Diagnosis Date  . Headache   . HTN (hypertension)   . Hyperlipidemia     Past Surgical History:  Procedure Laterality Date  . gallbladder removed       reports that he has been smoking cigarettes. He has never used smokeless tobacco. He reports current alcohol use. He reports  that he does not use drugs.  No Known Allergies  Family History  Family history unknown: Yes    Prior to Admission medications   Medication Sig Start Date End Date Taking? Authorizing Provider  amlodipine-olmesartan (AZOR) 10-20 MG tablet Take 1 tablet by mouth daily. 07/10/18  Yes [provider]  aspirin 81 MG chewable tablet Chew 81 mg by mouth daily.   Yes [provider]  famotidine (PEPCID) 20 MG tablet Take 20 mg by mouth daily. 12/28/19  Yes [provider]  ibuprofen (ADVIL) 200 MG tablet Take 600 mg by mouth every 6 (six) hours as needed for headache or moderate pain.   Yes [provider]  lovastatin (MEVACOR) 40 MG tablet Take 40 mg by mouth daily. with food 07/16/18  Yes [provider]  meloxicam (MOBIC) 15 MG tablet Take 15 mg by mouth daily. 01/23/21  Yes [provider]  traMADol (ULTRAM) 50 MG tablet Take 50 mg by mouth 2 (two) times daily as needed for moderate pain. 01/11/21  Yes [provider]    Physical Exam: Constitutional: Moderately built and nourished. Vitals:   02/04/21 0030 02/04/21 0130 02/04/21 0200 02/04/21 0245  BP: 117/70 117/73 116/68 110/71  Pulse: (!) 109 99 (!) 136 94  Resp: (!) 33 (!) 23 20 (!) 25  Temp:      TempSrc:      SpO2: 93% 91% 92% 96%  Weight:      Height:  Eyes: Anicteric no pallor. ENMT: No discharge from the ears eyes nose or mouth. Neck: No mass felt.  No neck rigidity. Respiratory: No rhonchi or crepitations. Cardiovascular: S1-S2 heard. Abdomen: Soft nontender bowel sound present. Musculoskeletal: No edema. Skin: No rash. Neurologic: Alert awake oriented to time place and person.  Moves all extremities. Psychiatric: Appears normal.  Normal affect.   Labs on Admission: I have personally reviewed following labs and imaging studies  CBC: Recent Labs  Lab 02/03/21 1654  WBC 11.9*  NEUTROABS 9.7*  HGB 15.1  HCT 43.2  MCV 98.6  PLT 891   Basic  Metabolic Panel: Recent Labs  Lab 02/03/21 1654  NA 134*  K 3.8  CL 104  CO2 23  GLUCOSE 121*  BUN 11  CREATININE 1.01  CALCIUM 10.3   GFR: Estimated Creatinine Clearance: 93.4 mL/min (by C-G formula based on SCr of 1.01 mg/dL). Liver Function Tests: Recent Labs  Lab 02/03/21 1654  AST 21  ALT 26  ALKPHOS 83  BILITOT 1.6*  PROT 7.0  ALBUMIN 3.8   No results for input(s): LIPASE, AMYLASE in the last 168 hours. No results for input(s): AMMONIA in the last 168 hours. Coagulation Profile: Recent Labs  Lab 02/03/21 1939  INR 1.1   Cardiac Enzymes: No results for input(s): CKTOTAL, CKMB, CKMBINDEX, TROPONINI in the last 168 hours. BNP (last 3 results) No results for input(s): PROBNP in the last 8760 hours. HbA1C: No results for input(s): HGBA1C in the last 72 hours. CBG: No results for input(s): GLUCAP in the last 168 hours. Lipid Profile: No results for input(s): CHOL, HDL, LDLCALC, TRIG, CHOLHDL, LDLDIRECT in the last 72 hours. Thyroid Function Tests: No results for input(s): TSH, T4TOTAL, FREET4, T3FREE, THYROIDAB in the last 72 hours. Anemia Panel: No results for input(s): VITAMINB12, FOLATE, FERRITIN, TIBC, IRON, RETICCTPCT in the last 72 hours. Urine analysis:    Component Value Date/Time   COLORURINE AMBER (A) 02/03/2021 2051   APPEARANCEUR CLEAR 02/03/2021 2051   LABSPEC 1.018 02/03/2021 2051   PHURINE 6.0 02/03/2021 2051   GLUCOSEU NEGATIVE 02/03/2021 2051   HGBUR SMALL (A) 02/03/2021 2051   BILIRUBINUR NEGATIVE 02/03/2021 2051   [place] NEGATIVE 02/03/2021 2051   PROTEINUR 30 (A) 02/03/2021 2051   NITRITE NEGATIVE 02/03/2021 2051   LEUKOCYTESUR NEGATIVE 02/03/2021 2051   Sepsis Labs: @LABRCNTIP (procalcitonin:4,lacticidven:4) ) Recent Results (from the past 240 hour(s))  Resp Panel by RT-PCR (Flu A&B, Covid) Nasopharyngeal Swab     Status: None   Collection Time: 02/03/21  9:20 PM   Specimen: Nasopharyngeal Swab; Nasopharyngeal(NP) swabs in  vial transport medium  Result Value Ref Range Status   SARS Coronavirus 2 by RT PCR NEGATIVE NEGATIVE Final    Comment: (NOTE) SARS-CoV-2 target nucleic acids are NOT DETECTED.  The SARS-CoV-2 RNA is generally detectable in upper respiratory specimens during the acute phase of infection. The lowest concentration of SARS-CoV-2 viral copies this assay can detect is 138 copies/mL. A negative result does not preclude SARS-Cov-2 infection and should not be used as the sole basis for treatment or other patient management decisions. A negative result may occur with  improper specimen collection/handling, submission of specimen other than nasopharyngeal swab, presence of viral mutation(s) within the areas targeted by this assay, and inadequate number of viral copies(<138 copies/mL). A negative result must be combined with clinical observations, patient history, and epidemiological information. The expected result is Negative.  Fact Sheet for Patients:  EntrepreneurPulse.com.au  Fact Sheet for Healthcare Providers:  IncredibleEmployment.be  This  test is no t yet approved or cleared by the [place] and  has been authorized for detection and/or diagnosis of SARS-CoV-2 by FDA under an Emergency Use Authorization (EUA). This EUA will remain  in effect (meaning this test can be used) for the duration of the COVID-19 declaration under Section 564(b)(1) of the Act, 21 U.S.C.section 360bbb-3(b)(1), unless the authorization is terminated  or revoked sooner.       Influenza A by PCR NEGATIVE NEGATIVE Final   Influenza B by PCR NEGATIVE NEGATIVE Final    Comment: (NOTE) The Xpert Xpress SARS-CoV-2/FLU/RSV plus assay is intended as an aid in the diagnosis of influenza from Nasopharyngeal swab specimens and should not be used as a sole basis for treatment. Nasal washings and aspirates are unacceptable for Xpert Xpress SARS-CoV-2/FLU/RSV testing.  Fact  Sheet for Patients: EntrepreneurPulse.com.au  Fact Sheet for Healthcare Providers: IncredibleEmployment.be  This test is not yet approved or cleared by the [place] FDA and has been authorized for detection and/or diagnosis of SARS-CoV-2 by FDA under an Emergency Use Authorization (EUA). This EUA will remain in effect (meaning this test can be used) for the duration of the COVID-19 declaration under Section 564(b)(1) of the Act, 21 U.S.C. section 360bbb-3(b)(1), unless the authorization is terminated or revoked.  Performed at [facility], [place] [address], [place], [place] [address]      Radiological Exams on Admission: DG Chest Port 1 View  Result Date: 02/03/2021 CLINICAL DATA:  Chest pain and diarrhea EXAM: PORTABLE CHEST 1 VIEW COMPARISON:  07/17/2007 FINDINGS: The heart size and mediastinal contours are within normal limits. Both lungs are clear. The visualized skeletal structures are unremarkable. IMPRESSION: No active disease. Electronically Signed   By: Donavan Foil M.D.   On: 02/03/2021 19:08    EKG: Independently reviewed.  Normal sinus rhythm.  Assessment/Plan Principal Problem:   Chest pain Active Problems:   Acute bronchitis   Fever in adult   Diarrhea   Essential hypertension    1. Chest pain -given history of vascular disease with history of tobacco abuse hypertension hyperlipidemia we will cycle cardiac markers check 2D echo and consult cardiology with concern for CAD.  On as needed nitroglycerin and aspirin.  Continue statins.  EKG does not show any signs of pericarditis.  Since patient's D-dimer is elevated will get VQ scan. 2. Cough with fever cause not clear.  Will check respiratory viral panel.  COVID test was negative.  Blood cultures have been sent. 3. Diarrhea -has not been on any antibiotics recently.  Denies any recent travel or sick contacts.  Check stool studies.  Continue to observe. 4. Tick bite we  will get versus Lyme titer.  Empirically treating with doxycycline. 5. Hypertension on ARB and amlodipine. 6. Hyperlipidemia on statins.   DVT prophylaxis: Lovenox. Code Status: Full code. Family Communication: Discussed with patient. Disposition Plan: Home. Consults called: Cardiology. Admission status: Observation.   Rise Patience MD [facility] Pager [phone].  If 7PM-7AM, please contact night-coverage [email or web address] Password [facility]  02/04/2021, 3:07 AM

## 2021-02-04 NOTE — ED Notes (Signed)
Pt requesting something to eat and drink Informed the provider has placed and NPO order at this time

## 2021-02-04 NOTE — ED Notes (Signed)
Per Dr. Hal Hope, plan to give 0.4mg  nitroglycerin, sublingual

## 2021-02-04 NOTE — ED Notes (Signed)
Provider notified of elevated Troponin. Provider at bedside, verbal order for 325 ASA

## 2021-02-04 NOTE — ED Notes (Signed)
RN paged Dr. Hal Hope per pt feeling chest "burning" halfway through albuterol treatment. Pt stated to this RN that he stopped treatment & was unable to tolerate finishing it

## 2021-02-04 NOTE — ED Notes (Signed)
RN messaged pharmacy for due doxycycline

## 2021-02-04 NOTE — Progress Notes (Signed)
ANTICOAGULATION CONSULT NOTE - Follow Up Consult  Pharmacy Consult for Heparin Indication: chest pain/ACS  No Known Allergies  Patient Measurements: Height: 5\' 11"  (180.3 cm) Weight: 111.9 kg (246 lb 12.8 oz) IBW/kg (Calculated) : 75.3 kg Heparin Dosing Weight: 99.5 kg  Vital Signs: Temp: 97.8 F (36.6 C) (05/28 1600) Temp Source: Oral (05/28 1600) BP: 118/67 (05/28 1600) Pulse Rate: 79 (05/28 1600)  Labs: Recent Labs    02/03/21 1654 02/03/21 1939 02/04/21 0305 02/04/21 0506 02/04/21 0954 02/04/21 1355 02/04/21 1832  HGB 15.1  --  14.0  --   --   --   --   HCT 43.2  --  40.9  --   --   --   --   PLT 150  --  151  --   --   --   --   APTT  --  33  --   --   --   --   --   LABPROT  --  14.6  --   --   --   --   --   INR  --  1.1  --   --   --   --   --   HEPARINUNFRC  --   --   --   --   --   --  <0.10*  CREATININE 1.01  --  1.38*  --   --   --   --   TROPONINIHS 12 14 216*   < > 2,883* 3,228* 2,610*   < > = values in this interval not displayed.    Estimated Creatinine Clearance: 67 mL/min (A) (by C-G formula based on SCr of 1.38 mg/dL (H)).   Medical History: Past Medical History:  Diagnosis Date  . BMI 34.0-34.9,adult   . Headache   . HTN (hypertension)   . Hyperlipidemia     Assessment: 65 yo male with a past medical history of vascular disease, hypertension, hyperlipidemia, and tobacco abuse presents with chest pain. PTA the patient is not on anticoagulation. Pharmacy is consulted to dose heparin.  Initial heparin level is undetectable on 1300 units/hr. No s/s of overt bleeding noted per RN   Goal of Therapy:  Heparin level 0.3-0.7 units/ml Monitor platelets by anticoagulation protocol: Yes   Plan:  -Heparin 2000 units IV bolus  -Increase heparin infusion to 1600 units/hr -Obtain a 6-hr heparin level -Monitor a daily heparin level and CBC -Monitor for signs and symptoms of bleeding  Albertina Parr, PharmD., BCPS, BCCCP Clinical  Pharmacist Please refer to [facility] for unit-specific pharmacist

## 2021-02-04 NOTE — Consult Note (Addendum)
Cardiology Consultation:   Patient ID: [name] MRN: [id number]; DOB: 05-18-1956  Admit date: 02/03/2021 Date of Consult: 02/04/2021  PCP:  Orpah Melter, MD   [facility] Providers Cardiologist:  None   Click here to update MD or APP on Care Team, Refresh:1}     Patient Profile:   [name] is a 65 y.o. 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  History of Present Illness:   [name] lives in [place] with his wife.  He is a Corporate investment banker.  However, he has been out on short-term disability recently due to a shoulder injury.  Given recent shoulder injury he has also been less active than usual.   He has a long smoking history with about 50-year pack years.  He rarely drinks alcohol and does not use illicit drugs.  He has a history of heart disease in his paternal grandfather (although the details are unknown) as well as in his brother who was recently diagnosed with congestive heart failure.  Patient has no previous cardiac history.  Echocardiogram in 04/2020 showed EF 55 to 60%, G1 DD, normal RV, mild aortic stenosis and mild dilation of the ascending aorta at 38 mm.  Cardiac monitor at that time showed sinus rhythm with occasional nonsustained SVT (longest episode 11 seconds).  The patient had a CT angio of the neck in 06/2020 due to dizziness and eye pain.  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  It was also positive for segmental occlusion of a nondominant right vertebral artery in the V2 segment with probable retrograde reconstitution of the distal vessel.  Vascular surgery consultation was  recommended.  The patient was in his usual state of health until this past week when he noticed progressive dyspnea on exertion and chest pain.  His chest pain was described as a tightness and heaviness.  There was no radiation or associated diaphoresis.  It did improve with rest.  He is also noticed a cough over the past week.  He believes it is worse when he is laying flat. Also chest tightness when he inspires. Also some low grade temps and chills. No lower extremity edema, orthopnea or PND.  He does sleep on multiple pillows due to shoulder injury.  Of note, he noticed a tick on his left inner thigh last Sunday.  There was no associated rashes. No swelling or pain in the calves.  He presented [facility] ED on 02/04/27 for evaluation. In the ER patient was febrile with temperature of 100.6 F with chest x-ray unremarkable. EKG non ischemic with normal sinus rhythm. BNP 101. WBC mildly elevated at 11.9. UA with small Hg and protein but no UTII.  Patient was found to be mildly wheezing initially which improved with nebulizers. Nitroglycerin improved patient chest pain from 6-3. Blood cultures with NGTD. Given tick bite RMSF titer and Lyme titers were ordered and started on doxy. High sensitive troponin initially negative but later became elevated (HS troponin 14--> 216--> 634). D-dimer elevated was 1.13. COVID test negative. Creat was initially normal at 1.01 but increased to 1.38.   Past Medical History:  Diagnosis Date  .  BMI 34.0-34.9,adult   . Headache   . HTN (hypertension)   . Hyperlipidemia     Past Surgical History:  Procedure Laterality Date  . gallbladder removed       Home Medications:  Prior to Admission medications   Medication Sig Start Date End Date Taking? Authorizing Provider  amlodipine-olmesartan (AZOR) 10-20 MG tablet Take 1 tablet by mouth daily. 07/10/18  Yes [provider]  aspirin 81 MG chewable tablet Chew 81 mg by mouth daily.   Yes [provider]   famotidine (PEPCID) 20 MG tablet Take 20 mg by mouth daily. 12/28/19  Yes [provider]  ibuprofen (ADVIL) 200 MG tablet Take 600 mg by mouth every 6 (six) hours as needed for headache or moderate pain.   Yes [provider]  lovastatin (MEVACOR) 40 MG tablet Take 40 mg by mouth daily. with food 07/16/18  Yes [provider]  meloxicam (MOBIC) 15 MG tablet Take 15 mg by mouth daily. 01/23/21  Yes [provider]  traMADol (ULTRAM) 50 MG tablet Take 50 mg by mouth 2 (two) times daily as needed for moderate pain. 01/11/21  Yes [provider]    Inpatient Medications: Scheduled Meds: . amLODipine  10 mg Oral Daily  . aspirin      . aspirin  81 mg Oral Daily  . enoxaparin (LOVENOX) injection  40 mg Subcutaneous Q24H  . famotidine  20 mg Oral Daily  . pravastatin  40 mg Oral q1800   Continuous Infusions: . doxycycline (VIBRAMYCIN) IV     PRN Meds: acetaminophen **OR** acetaminophen, nitroGLYCERIN  Allergies:   No Known Allergies  Social History:   Social History   Socioeconomic History  . Marital status: Married    Spouse name: Not on file  . Number of children: 2  . Years of education: 106  . Highest education level: Not on file  Occupational History  . Not on file  Tobacco Use  . Smoking status: Current Every Day Smoker    Types: Cigarettes  . Smokeless tobacco: Never Used  Vaping Use  . Vaping Use: Never used  Substance and Sexual Activity  . Alcohol use: Yes    Comment: occasional  . Drug use: Never  . Sexual activity: Not on file  Other Topics Concern  . Not on file  Social History Narrative   Right handed   One story home   Drinks caffeine   Social Determinants of Health   Financial Resource Strain: Not on file  Food Insecurity: Not on file  Transportation Needs: Not on file  Physical Activity: Not on file  Stress: Not on file  Social Connections: Not on file  Intimate Partner Violence: Not on file    Family  History:    Family History  Family history unknown: Yes     ROS:  Please see the history of present illness.  All other ROS reviewed and negative.     Physical Exam/Data:   Vitals:   02/04/21 0415 02/04/21 0537 02/04/21 0616 02/04/21 0630  BP:  (!) 106/55 113/65   Pulse:  89    Resp:  18 18   Temp: 97.9 F (36.6 C) 98.1 F (36.7 C) 97.8 F (36.6 C)   TempSrc: Oral Oral Oral   SpO2:  94% 94%   Weight:    111.9 kg  Height:    5\' 11"  (1.803 m)    Intake/Output Summary (Last 24 hours) at 02/04/2021 0856 Last data filed at 02/04/2021  0025 Gross per 24 hour  Intake 2000 ml  Output --  Net 2000 ml   Last 3 Weights 02/04/2021 02/03/2021 11/18/2020  Weight (lbs) 246 lb 12.8 oz 257 lb 0.9 oz 257 lb  Weight (kg) 111.948 kg 116.6 kg 116.574 kg     Body mass index is 34.42 kg/m.  General:  Well nourished, well developed, in no acute distress, obese HEENT: normal Lymph: no adenopathy Neck: no JVD Endocrine:  No thryomegaly Cardiac:  normal S1, S2; RRR; 1/6 murmur Lungs:  clear to auscultation bilaterally, no wheezing, rhonchi or rales  Abd: soft, nontender, no hepatomegaly  Ext: no edema Musculoskeletal:  No deformities, BUE and BLE strength normal and equal Skin: warm and dry  Neuro:  CNs 2-12 intact, no focal abnormalities noted Psych:  Normal affect   EKG:  The EKG was personally reviewed and demonstrates:  Sinus  Telemetry:  Telemetry was personally reviewed and demonstrates:  Sinus with rare PVC  Relevant CV Studies: Echo 04/29/20 IMPRESSION 1. Left ventricular ejection fraction, by estimation, is 55 to 60%. The  left ventricle has normal function. The left ventricle has no regional  wall motion abnormalities. Left ventricular diastolic parameters are  consistent with Grade I diastolic  dysfunction (impaired relaxation). Elevated left atrial pressure.  2. Right ventricular systolic function is normal. The right ventricular  size is normal. Tricuspid regurgitation  signal is inadequate for assessing  PA pressure.  3. The mitral valve is normal in structure. No evidence of mitral valve  regurgitation. No evidence of mitral stenosis.  4. The aortic valve has an indeterminant number of cusps. Aortic valve  regurgitation is not visualized. Mild aortic valve stenosis.  5. Aortic dilatation noted. There is mild dilatation of the ascending  aorta measuring 38 mm.  6. The inferior vena cava is normal in size with greater than 50%  respiratory variability, suggesting right atrial pressure of 3 mmHg.   __________________________  Cardiac monitor 04/2020 Study Highlights   Sinus rhythm  Occasional episodes of nonsustained SVT. The longest episode of SVT was 11 sec.  __________________________  CT angio neck 06/2020 IMPRESSION: 1. Recommend Vascular Surgery consultation regarding: - distal Aortic Arch 12 mm ulcerated plaque and/or developing Pseudoaneurysm. 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  2. 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 Probable retrograde reconstitution of the distal vessel.  3. Other great vessel and carotid atherosclerosis without hemodynamically significant stenosis.  4. Branches of the anterior and posterior circulation appear normal. Normal CT appearance of the brain.  5. Aortic Atherosclerosis (ICD10-I70.0) and Emphysema (ICD10-J43.9).  These results will be called to the ordering clinician or representative by the Radiologist Assistant, and communication documented in the PACS or [employer].  Laboratory Data:  High Sensitivity Troponin:   Recent Labs  Lab 02/03/21 1654 02/03/21 1939 02/04/21 0305 02/04/21 0506  TROPONINIHS 12 14 216* 634*     Chemistry Recent Labs  Lab 02/03/21 1654 02/04/21 0305  NA 134* 133*  K 3.8 3.2*  CL 104  101  CO2 23 18*  GLUCOSE 121* 245*  BUN 11 13  CREATININE 1.01 1.38*  CALCIUM 10.3 9.9  GFRNONAA >60 56*  ANIONGAP 7 14    Recent Labs  Lab 02/03/21 1654 02/04/21 0305  PROT 7.0 6.3*  ALBUMIN 3.8 3.2*  AST 21 42*  ALT 26 29  ALKPHOS 83 78  BILITOT 1.6* 0.9   Hematology Recent Labs  Lab 02/03/21 1654 02/04/21 0305  WBC 11.9* 11.2*  RBC 4.38 4.04*  HGB 15.1 14.0  HCT 43.2 40.9  MCV 98.6 101.2*  MCH 34.5* 34.7*  MCHC 35.0 34.2  RDW 12.7 12.9  PLT 150 151   BNP Recent Labs  Lab 02/03/21 1654  BNP 103.0*    DDimer  Recent Labs  Lab 02/03/21 1939  DDIMER 1.13*     Radiology/Studies:  DG Chest Port 1 View  Result Date: 02/03/2021 CLINICAL DATA:  Chest pain and diarrhea EXAM: PORTABLE CHEST 1 VIEW COMPARISON:  07/17/2007 FINDINGS: The heart size and mediastinal contours are within normal limits. Both lungs are clear. The visualized skeletal structures are unremarkable. IMPRESSION: No active disease. Electronically Signed   By: Donavan Foil M.D.   On: 02/03/2021 19:08     Assessment and Plan:   Chest pain: The patient certainly has risk factors for coronary artery disease given known vascular disease, hypertension, long smoking history, obesity and hyperlipidemia. HS troponin 14--> 216--> 634. Will plan to review echo once completed. If normal LV function can consider ischemic work up with stress testing.   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 V/Q pending   AKI: creat 1.01--> 1.38. I have held his ARB.  Tick bite: RMSF and lyme titers are pending.  Being treated empirically with doxycycline  Cough: having wheezing and productive cough. Chest tightness and dyspnea improved with bronchodilatory. WBC mildly elevated.   HLD: continue lovastatin   Risk Assessment/Risk Scores:     HEAR Score (for  undifferentiated chest pain):       For questions or updates, please contact [place] Please consult [email or web address] for contact info under    Signed, Angelena Form, PA-C  02/04/2021 8:56 AM As above, patient seen and examined.  Briefly he is a 65 year old male with past medical history of hypertension, hyperlipidemia, peripheral vascular disease, tobacco abuse, COPD for evaluation of chest pain and elevated troponin.  Patient typically does not have dyspnea on exertion, orthopnea, PND, pedal edema or exertional chest pain.  Over the past 1 week he has noticed increased dyspnea with activities.  No orthopnea, PND or pedal edema.  He has had subjective fever and cough productive of yellowish sputum.  He also has had chest tightness that is substernal that increases with inspiration.  It has been continuous for the past week.  He was admitted and cardiology asked to evaluate.  Note his dyspnea and tightness improved with bronchodilator.  Electrocardiogram shows normal sinus rhythm with no ST changes.  Troponin is 12, 14, 216 and 634.  BNP 103.  1 chest tightness-symptoms seem to correlate with recent URI.  His tightness increases with inspiration and improved with bronchodilator.  Electrocardiogram shows no ST changes.  However troponin is mildly increased.  Await echocardiogram.  If wall motion normal will arrange stress nuclear study for risk stratification.  Note D-dimer is mildly elevated.  VQ scan is pending.  2 upper respiratory infection-continue pulmonary toilet and antibiotics per primary care.  3 tobacco abuse-patient counseled on discontinuing.  4 aortic atherosclerosis-continue aspirin and statin.  5 hypertension-patient's blood pressure appears to be controlled.  We will continue present medications following discharge.  [name], MD

## 2021-02-04 NOTE — ED Notes (Signed)
RN attempted to call report x1 

## 2021-02-05 DIAGNOSIS — I214 Non-ST elevation (NSTEMI) myocardial infarction: Secondary | ICD-10-CM | POA: Diagnosis not present

## 2021-02-05 DIAGNOSIS — I700 Atherosclerosis of aorta: Secondary | ICD-10-CM | POA: Diagnosis present

## 2021-02-05 DIAGNOSIS — I2510 Atherosclerotic heart disease of native coronary artery without angina pectoris: Secondary | ICD-10-CM | POA: Diagnosis present

## 2021-02-05 DIAGNOSIS — R197 Diarrhea, unspecified: Secondary | ICD-10-CM | POA: Diagnosis present

## 2021-02-05 DIAGNOSIS — Z8249 Family history of ischemic heart disease and other diseases of the circulatory system: Secondary | ICD-10-CM | POA: Diagnosis not present

## 2021-02-05 DIAGNOSIS — N179 Acute kidney failure, unspecified: Secondary | ICD-10-CM | POA: Diagnosis present

## 2021-02-05 DIAGNOSIS — Z79899 Other long term (current) drug therapy: Secondary | ICD-10-CM | POA: Diagnosis not present

## 2021-02-05 DIAGNOSIS — E7800 Pure hypercholesterolemia, unspecified: Secondary | ICD-10-CM | POA: Diagnosis present

## 2021-02-05 DIAGNOSIS — I10 Essential (primary) hypertension: Secondary | ICD-10-CM | POA: Diagnosis not present

## 2021-02-05 DIAGNOSIS — J209 Acute bronchitis, unspecified: Secondary | ICD-10-CM | POA: Diagnosis present

## 2021-02-05 DIAGNOSIS — Z20822 Contact with and (suspected) exposure to covid-19: Secondary | ICD-10-CM | POA: Diagnosis present

## 2021-02-05 DIAGNOSIS — Z6834 Body mass index (BMI) 34.0-34.9, adult: Secondary | ICD-10-CM | POA: Diagnosis not present

## 2021-02-05 DIAGNOSIS — I350 Nonrheumatic aortic (valve) stenosis: Secondary | ICD-10-CM | POA: Diagnosis present

## 2021-02-05 DIAGNOSIS — M25511 Pain in right shoulder: Secondary | ICD-10-CM | POA: Diagnosis present

## 2021-02-05 DIAGNOSIS — F17210 Nicotine dependence, cigarettes, uncomplicated: Secondary | ICD-10-CM | POA: Diagnosis present

## 2021-02-05 DIAGNOSIS — E669 Obesity, unspecified: Secondary | ICD-10-CM | POA: Diagnosis present

## 2021-02-05 DIAGNOSIS — Z791 Long term (current) use of non-steroidal anti-inflammatories (NSAID): Secondary | ICD-10-CM | POA: Diagnosis not present

## 2021-02-05 DIAGNOSIS — J189 Pneumonia, unspecified organism: Secondary | ICD-10-CM | POA: Diagnosis present

## 2021-02-05 DIAGNOSIS — S70362A Insect bite (nonvenomous), left thigh, initial encounter: Secondary | ICD-10-CM | POA: Diagnosis present

## 2021-02-05 DIAGNOSIS — Z7982 Long term (current) use of aspirin: Secondary | ICD-10-CM | POA: Diagnosis not present

## 2021-02-05 DIAGNOSIS — R059 Cough, unspecified: Secondary | ICD-10-CM | POA: Diagnosis not present

## 2021-02-05 DIAGNOSIS — A419 Sepsis, unspecified organism: Secondary | ICD-10-CM | POA: Diagnosis present

## 2021-02-05 DIAGNOSIS — J439 Emphysema, unspecified: Secondary | ICD-10-CM | POA: Diagnosis present

## 2021-02-05 DIAGNOSIS — W57XXXA Bitten or stung by nonvenomous insect and other nonvenomous arthropods, initial encounter: Secondary | ICD-10-CM | POA: Diagnosis present

## 2021-02-05 DIAGNOSIS — R072 Precordial pain: Secondary | ICD-10-CM | POA: Diagnosis not present

## 2021-02-05 DIAGNOSIS — I471 Supraventricular tachycardia: Secondary | ICD-10-CM | POA: Diagnosis present

## 2021-02-05 DIAGNOSIS — E876 Hypokalemia: Secondary | ICD-10-CM | POA: Diagnosis present

## 2021-02-05 LAB — BASIC METABOLIC PANEL
Anion gap: 7 (ref 5–15)
BUN: 20 mg/dL (ref 8–23)
CO2: 22 mmol/L (ref 22–32)
Calcium: 10.1 mg/dL (ref 8.9–10.3)
Chloride: 107 mmol/L (ref 98–111)
Creatinine, Ser: 0.85 mg/dL (ref 0.61–1.24)
GFR, Estimated: >60 mL/min (ref >60)
Glucose, Bld: 109 mg/dL — ABNORMAL HIGH (ref 70–99)
Potassium: 4.2 mmol/L (ref 3.5–5.1)
Sodium: 136 mmol/L (ref 135–145)

## 2021-02-05 LAB — HEPARIN LEVEL (UNFRACTIONATED)
Heparin Unfractionated: <0.1 IU/mL — ABNORMAL LOW (ref 0.30–0.70)
Heparin Unfractionated: 0.25 IU/mL — ABNORMAL LOW (ref 0.30–0.70)
Heparin Unfractionated: 0.29 IU/mL — ABNORMAL LOW (ref 0.30–0.70)

## 2021-02-05 LAB — CBC
HCT: 41.6 % (ref 39.0–52.0)
Hemoglobin: 14.2 g/dL (ref 13.0–17.0)
MCH: 33.9 pg (ref 26.0–34.0)
MCHC: 34.1 g/dL (ref 30.0–36.0)
MCV: 99.3 fL (ref 80.0–100.0)
Platelets: 184 10*3/uL (ref 150–400)
RBC: 4.19 MIL/uL — ABNORMAL LOW (ref 4.22–5.81)
RDW: 12.9 % (ref 11.5–15.5)
WBC: 16 10*3/uL — ABNORMAL HIGH (ref 4.0–10.5)
nRBC: 0 % (ref 0.0–0.2)

## 2021-02-05 LAB — MAGNESIUM: Magnesium: 2 mg/dL (ref 1.7–2.4)

## 2021-02-05 MED ORDER — DOCUSATE SODIUM 100 MG PO CAPS: 100.0000 mg | ORAL_CAPSULE | Freq: Two times a day (BID) | ORAL | Status: DC | PRN

## 2021-02-05 MED ORDER — CALCIUM CARBONATE ANTACID 500 MG PO CHEW
1.0000 | CHEWABLE_TABLET | Freq: Three times a day (TID) | ORAL | Status: DC | PRN
  Filled 2021-02-05: qty 1

## 2021-02-05 MED ORDER — HEPARIN BOLUS VIA INFUSION
3000.0000 [IU] | Freq: Once | INTRAVENOUS | Status: AC
  Administered 2021-02-05: 3000 [IU] via INTRAVENOUS
  Filled 2021-02-05: qty 3000

## 2021-02-05 MED ORDER — SODIUM CHLORIDE 0.9% FLUSH
3.0000 mL | Freq: Two times a day (BID) | INTRAVENOUS | Status: DC
  Administered 2021-02-06: 3 mL via INTRAVENOUS

## 2021-02-05 MED ORDER — POLYETHYLENE GLYCOL 3350 17 G PO PACK: 17.0000 g | PACK | Freq: Two times a day (BID) | ORAL | Status: DC | PRN

## 2021-02-05 MED ORDER — DOCUSATE SODIUM 100 MG PO CAPS
100.0000 mg | ORAL_CAPSULE | Freq: Two times a day (BID) | ORAL | Status: DC | PRN
  Administered 2021-02-05: 100 mg via ORAL
  Filled 2021-02-05: qty 1

## 2021-02-05 MED ORDER — ALUM & MAG HYDROXIDE-SIMETH 200-200-20 MG/5ML PO SUSP: 15.0000 mL | Freq: Four times a day (QID) | ORAL | Status: DC | PRN

## 2021-02-05 MED ORDER — SODIUM CHLORIDE 0.9 % IV SOLN
2.0000 g | INTRAVENOUS | Status: DC
  Administered 2021-02-05 – 2021-02-07 (×3): 2 g via INTRAVENOUS
  Filled 2021-02-05 (×3): qty 20
  Filled 2021-02-05: qty 2

## 2021-02-05 MED ORDER — METOPROLOL TARTRATE 12.5 MG HALF TABLET
12.5000 mg | ORAL_TABLET | Freq: Two times a day (BID) | ORAL | Status: DC
  Administered 2021-02-05 – 2021-02-08 (×7): 12.5 mg via ORAL
  Filled 2021-02-05 (×7): qty 1

## 2021-02-05 MED ORDER — GUAIFENESIN-DM 100-10 MG/5ML PO SYRP
10.0000 mL | ORAL_SOLUTION | Freq: Four times a day (QID) | ORAL | Status: DC | PRN
  Administered 2021-02-05 – 2021-02-08 (×5): 10 mL via ORAL
  Filled 2021-02-05 (×6): qty 10

## 2021-02-05 MED ORDER — ONDANSETRON 4 MG PO TBDP
4.0000 mg | ORAL_TABLET | Freq: Three times a day (TID) | ORAL | Status: DC | PRN
  Filled 2021-02-05: qty 1

## 2021-02-05 MED ORDER — ROSUVASTATIN CALCIUM 20 MG PO TABS
40.0000 mg | ORAL_TABLET | Freq: Every day | ORAL | Status: DC
  Administered 2021-02-05 – 2021-02-08 (×4): 40 mg via ORAL
  Filled 2021-02-05 (×4): qty 2

## 2021-02-05 NOTE — Care Management Obs Status (Signed)
[facility] NOTIFICATION   Patient Details  Name: [name] MRN: [id number] Date of Birth: [birth year]   Medicare Observation Status Notification Given:  Yes    Norina Buzzard, RN 02/05/2021, 9:21 AM

## 2021-02-05 NOTE — Progress Notes (Signed)
Progress Note  Patient Name: [name] Date of Encounter: 02/05/2021  [facility] Cardiologist: Dr Stanford Breed  Subjective   No CP; dyspnea improving; productive cough  Inpatient Medications    Scheduled Meds: . aspirin  81 mg Oral Daily  . famotidine  20 mg Oral Daily  . nicotine  21 mg Transdermal Daily  . pravastatin  40 mg Oral q1800   Continuous Infusions: . doxycycline (VIBRAMYCIN) IV 100 mg (02/04/21 2139)  . heparin 2,000 Units/hr (02/05/21 2229)   PRN Meds: acetaminophen **OR** acetaminophen, nitroGLYCERIN   Vital Signs    Vitals:   02/04/21 1100 02/04/21 1600 02/04/21 2036 02/05/21 0606  BP: 107/79 118/67 108/78 113/83  Pulse:  79 79 72  Resp: 20 20 16 19   Temp:  97.8 F (36.6 C) 98.4 F (36.9 C) 98.4 F (36.9 C)  TempSrc:  Oral Oral Oral  SpO2:  98% 94% 95%  Weight:      Height:        Intake/Output Summary (Last 24 hours) at 02/05/2021 0717 Last data filed at 02/04/2021 1500 Gross per 24 hour  Intake 791.41 ml  Output --  Net 791.41 ml   Last 3 Weights 02/04/2021 02/03/2021 11/18/2020  Weight (lbs) 246 lb 12.8 oz 257 lb 0.9 oz 257 lb  Weight (kg) 111.948 kg 116.6 kg 116.574 kg      Telemetry    Sinus- Personally Reviewed  Physical Exam   GEN: No acute distress.   Neck: No JVD Cardiac: RRR, 2/6 systolic murmur Respiratory: Clear to auscultation bilaterally. GI: Soft, nontender, non-distended  MS: No edema Neuro:  Nonfocal  Psych: Normal affect   Labs    High Sensitivity Troponin:   Recent Labs  Lab 02/04/21 0506 02/04/21 0954 02/04/21 1355 02/04/21 1832 02/04/21 2148  TROPONINIHS 634* 2,883* 3,228* 2,610* 2,373*      Chemistry Recent Labs  Lab 02/03/21 1654 02/04/21 0305 02/05/21 0418  NA 134* 133* 136  K 3.8 3.2* 4.2  CL 104 101 107  CO2 23 18* 22  GLUCOSE 121* 245* 109*  BUN 11 13 20   CREATININE 1.01 1.38* 0.85  CALCIUM 10.3 9.9 10.1  PROT 7.0 6.3*  --   ALBUMIN 3.8 3.2*  --   AST 21 42*  --   ALT 26  29  --   ALKPHOS 83 78  --   BILITOT 1.6* 0.9  --   GFRNONAA >60 56* >60  ANIONGAP 7 14 7      Hematology Recent Labs  Lab 02/03/21 1654 02/04/21 0305 02/05/21 0418  WBC 11.9* 11.2* 16.0*  RBC 4.38 4.04* 4.19*  HGB 15.1 14.0 14.2  HCT 43.2 40.9 41.6  MCV 98.6 101.2* 99.3  MCH 34.5* 34.7* 33.9  MCHC 35.0 34.2 34.1  RDW 12.7 12.9 12.9  PLT 150 151 184    BNP Recent Labs  Lab 02/03/21 1654  BNP 103.0*     DDimer  Recent Labs  Lab 02/03/21 1939  DDIMER 1.13*     Radiology    CT ANGIO CHEST PE W OR WO CONTRAST  Result Date: 02/04/2021 CLINICAL DATA:  Chest pain, shortness of breath, concern for PE EXAM: CT ANGIOGRAPHY CHEST WITH CONTRAST TECHNIQUE: Multidetector CT imaging of the chest was performed using the standard protocol during bolus administration of intravenous contrast. Multiplanar CT image reconstructions and MIPs were obtained to evaluate the vascular anatomy. CONTRAST:  76mL OMNIPAQUE IOHEXOL 350 MG/ML SOLN COMPARISON:  None. FINDINGS: Cardiovascular: Examination for pulmonary embolism is limited  by breath motion artifact. Within this limitation, no evidence of pulmonary embolism through the proximal segmental pulmonary arterial level. Three-vessel coronary artery calcification. No pericardial effusion. Severe, mixed aortic atherosclerosis. Mediastinum/Nodes: No enlarged mediastinal, hilar, or axillary lymph nodes. Thyroid gland, trachea, and esophagus demonstrate no significant findings. Lungs/Pleura: There is scattered heterogeneous and ground-glass airspace opacity throughout, for example in the dependent right lower lobe (series 9, image 61) and in the posterior left upper lobe (series 9, image 40). Evaluation of airspace disease is likewise somewhat limited by breath motion artifact. No pleural effusion or pneumothorax. Upper Abdomen: No acute abnormality. Musculoskeletal: No chest wall abnormality. No acute or significant osseous findings. Review of the MIP  images confirms the above findings. IMPRESSION: 1. Examination for pulmonary embolism is limited by breath motion artifact. Within this limitation, no evidence of pulmonary embolism through the proximal segmental pulmonary arterial level. 2. Scattered heterogeneous and ground-glass airspace opacity throughout, for example in the dependent right lower lobe and in the posterior left upper lobe. Evaluation of airspace disease is likewise somewhat limited by breath motion artifact. Findings are consistent with multifocal infection or aspiration. 3. Coronary artery disease. Aortic Atherosclerosis (ICD10-I70.0). Electronically Signed   By: Eddie Candle M.D.   On: 02/04/2021 17:18   DG Chest Port 1 View  Result Date: 02/03/2021 CLINICAL DATA:  Chest pain and diarrhea EXAM: PORTABLE CHEST 1 VIEW COMPARISON:  07/17/2007 FINDINGS: The heart size and mediastinal contours are within normal limits. Both lungs are clear. The visualized skeletal structures are unremarkable. IMPRESSION: No active disease. Electronically Signed   By: Donavan Foil M.D.   On: 02/03/2021 19:08   ECHOCARDIOGRAM COMPLETE  Result Date: 02/04/2021    ECHOCARDIOGRAM REPORT   Patient Name:   [name] Date of Exam: 02/04/2021 Medical Rec #:  [id number]        Height:       71.0 in Accession #:    [id number]       Weight:       246.8 lb Date of Birth:  [birth year]        BSA:          2.306 m Patient Age:    65 years         BP:           107/79 mmHg Patient Gender: M                HR:           87 bpm. Exam Location:  Inpatient Procedure: 2D Echo, Cardiac Doppler and Color Doppler Indications:    R07.9* Chest pain, unspecified  History:        Patient has prior history of Echocardiogram examinations, most                 recent 04/29/2020. Signs/Symptoms:Shortness of Breath. Coughing.  Sonographer:    Merrie Roof RDCS Referring Phys: [place]  1. Left ventricular ejection fraction, by estimation, is 60 to 65%. The  left ventricle has normal function. The left ventricle has no regional wall motion abnormalities. Left ventricular diastolic parameters are consistent with Grade I diastolic dysfunction (impaired relaxation). Elevated left atrial pressure.  2. Right ventricular systolic function is normal. The right ventricular size is normal.  3. The mitral valve is normal in structure. No evidence of mitral valve regurgitation. No evidence of mitral stenosis.  4. The aortic valve has an indeterminant number of cusps. Aortic valve regurgitation is not  visualized. Moderate aortic valve stenosis.  5. The inferior vena cava is normal in size with greater than 50% respiratory variability, suggesting right atrial pressure of 3 mmHg. FINDINGS  Left Ventricle: Left ventricular ejection fraction, by estimation, is 60 to 65%. The left ventricle has normal function. The left ventricle has no regional wall motion abnormalities. The left ventricular internal cavity size was normal in size. There is  no left ventricular hypertrophy. Left ventricular diastolic parameters are consistent with Grade I diastolic dysfunction (impaired relaxation). Elevated left atrial pressure. Right Ventricle: The right ventricular size is normal. Right ventricular systolic function is normal. Left Atrium: Left atrial size was normal in size. Right Atrium: Right atrial size was normal in size. Pericardium: There is no evidence of pericardial effusion. Mitral Valve: The mitral valve is normal in structure. Mild mitral annular calcification. No evidence of mitral valve regurgitation. No evidence of mitral valve stenosis. Tricuspid Valve: The tricuspid valve is normal in structure. Tricuspid valve regurgitation is trivial. No evidence of tricuspid stenosis. Aortic Valve: The aortic valve has an indeterminant number of cusps. Aortic valve regurgitation is not visualized. Moderate aortic stenosis is present. Aortic valve mean gradient measures 25.8 mmHg. Aortic valve  peak gradient measures 45.2 mmHg. Aortic valve area, by VTI measures 1.28 cm. Pulmonic Valve: The pulmonic valve was not well visualized. Pulmonic valve regurgitation is not visualized. No evidence of pulmonic stenosis. Aorta: The aortic root is normal in size and structure. Venous: The inferior vena cava is normal in size with greater than 50% respiratory variability, suggesting right atrial pressure of 3 mmHg.   LEFT VENTRICLE PLAX 2D LVIDd:         5.30 cm  Diastology LVIDs:         4.00 cm  LV e' medial:    9.46 cm/s LV PW:         1.00 cm  LV E/e' medial:  11.3 LV IVS:        1.20 cm  LV e' lateral:   6.31 cm/s LVOT diam:     2.10 cm  LV E/e' lateral: 17.0 LV SV:         84 LV SV Index:   37 LVOT Area:     3.46 cm  RIGHT VENTRICLE RV Basal diam:  3.90 cm LEFT ATRIUM           Index       RIGHT ATRIUM           Index LA diam:      3.00 cm 1.30 cm/m  RA Area:     24.60 cm LA Vol (A2C): 83.9 ml 36.39 ml/m RA Volume:   85.30 ml  37.00 ml/m LA Vol (A4C): 35.6 ml 15.44 ml/m  AORTIC VALVE AV Area (Vmax):    1.17 cm AV Area (Vmean):   1.16 cm AV Area (VTI):     1.28 cm AV Vmax:           336.20 cm/s AV Vmean:          237.200 cm/s AV VTI:            0.658 m AV Peak Grad:      45.2 mmHg AV Mean Grad:      25.8 mmHg LVOT Vmax:         114.00 cm/s LVOT Vmean:        79.550 cm/s LVOT VTI:          0.243 m LVOT/AV VTI ratio: 0.37  AORTA Ao Root diam: 3.70 cm MITRAL VALVE MV Area (PHT): 2.96 cm     SHUNTS MV Decel Time: 256 msec     Systemic VTI:  0.24 m MV E velocity: 107.00 cm/s  Systemic Diam: 2.10 cm MV A velocity: 116.00 cm/s MV E/A ratio:  0.92 [name] MD Electronically signed by [name] MD Signature Date/Time: 02/04/2021/12:00:26 PM    Final     Patient Profile     65 year old male with past medical history of hypertension, hyperlipidemia, peripheral vascular disease, tobacco abuse, COPD for evaluation of chest pain and elevated troponin. Admitted with URI and chest tightness. CTA shows no  pulmonary embolus; multifocal infection vs aspiration and 3 vessel coronary calcification. Troponin is 12, 14, 216, 634, 2883 and 3228.  BNP 103.  Echocardiogram shows normal LV function, grade 1 diastolic dysfunction, moderate aortic stenosis with mean gradient 26 mmHg.  Assessment & Plan    1 non-ST elevation myocardial infarction-symptoms seem more consistent with upper respiratory infection but troponin has increased to greater than 3000.  His chest CT also shows three-vessel coronary calcification.  Therefore feel we need to proceed with cardiac catheterization for definitive evaluation.  The risks and benefits including myocardial infarction, CVA and death discussed and he agrees to proceed.  Continue aspirin, heparin and statin.  Add metoprolol 12.5 mg twice daily.  Note LV function is normal on echocardiogram.  2 upper respiratory infection/pneumonia-continue pulmonary toilet and antibiotics per primary care.  3 tobacco abuse-patient previously counseled on discontinuing.  4 aortic stenosis-patient will need follow-up echocardiograms in the future.  Moderate on echocardiogram this admission.  5 hypertension-patient's blood pressure appears to be controlled.  We will continue present medications following discharge.  For questions or updates, please contact [place] Please consult [email or web address] for contact info under        Signed, [name], MD  02/05/2021, 7:17 AM

## 2021-02-05 NOTE — Progress Notes (Signed)
ANTICOAGULATION CONSULT NOTE  Pharmacy Consult for Heparin Indication: chest pain/ACS  No Known Allergies  Patient Measurements: Height: 5\' 11"  (180.3 cm) Weight: 111.9 kg (246 lb 12.8 oz) IBW/kg (Calculated) : 75.3 kg Heparin Dosing Weight: 99.5 kg  Vital Signs: Temp: 98.3 F (36.8 C) (05/29 2001) Temp Source: Oral (05/29 2001) BP: 121/80 (05/29 2001) Pulse Rate: 75 (05/29 2001)  Labs: Recent Labs    02/03/21 1654 02/03/21 1939 02/04/21 0305 02/04/21 0506 02/04/21 1355 02/04/21 1832 02/04/21 1832 02/04/21 2148 02/05/21 0418 02/05/21 1244 02/05/21 1928  HGB 15.1  --  14.0  --   --   --   --   --  14.2  --   --   HCT 43.2  --  40.9  --   --   --   --   --  41.6  --   --   PLT 150  --  151  --   --   --   --   --  184  --   --   APTT  --  33  --   --   --   --   --   --   --   --   --   LABPROT  --  14.6  --   --   --   --   --   --   --   --   --   INR  --  1.1  --   --   --   --   --   --   --   --   --   HEPARINUNFRC  --   --   --   --   --  <0.10*   < >  --  <0.10* 0.25* 0.29*  CREATININE 1.01  --  1.38*  --   --   --   --   --  0.85  --   --   TROPONINIHS 12 14 216*   < > 3,228* 2,610*  --  2,373*  --   --   --    < > = values in this interval not displayed.    Estimated Creatinine Clearance: 108.7 mL/min (by C-G formula based on SCr of 0.85 mg/dL).   Medical History: Past Medical History:  Diagnosis Date  . BMI 34.0-34.9,adult   . Headache   . HTN (hypertension)   . Hyperlipidemia     Assessment: 65 yo male with a past medical history of vascular disease, hypertension, hyperlipidemia, and tobacco abuse presents with chest pain. PTA the patient is not on anticoagulation. Pharmacy is consulted to dose heparin.  Heparin level is subtherapeutic at 0.29, on 2200 units/hr. CBC stable. No s/sx of bleeding or infusion issues.    Goal of Therapy:  Heparin level 0.3-0.7 units/ml Monitor platelets by anticoagulation protocol: Yes   Plan:  -Increase heparin  IV to 2300 units/hr to get into goal range  -Monitor a daily heparin level and CBC -Monitor for signs and symptoms of bleeding  Antonietta Jewel, PharmD, [place] Pharmacist  Phone: [phone] 02/05/2021 8:45 PM  Please check AMION for all [place] phone numbers After 10:00 PM, call [place] [phone]

## 2021-02-05 NOTE — Progress Notes (Signed)
PROGRESS NOTE    DEMORIO SEELEY  BPZ:[id number] DOB: 09-Feb-1956 DOA: 02/03/2021 PCP: Orpah Melter, MD  [id number]   Assessment & Plan:   Principal Problem:   Chest pain Active Problems:   Acute bronchitis   Fever in adult   Diarrhea   Essential hypertension   Hyperlipidemia   HTN (hypertension)   BMI 34.0-34.9,adult   NSTEMI (non-ST elevated myocardial infarction) ([place])   ALEXY HELDT is a 65 y.o. male with history of hypertension, hyperlipidemia, vascular disease, tobacco abuse presents to the ER with complaints of having chest pain with nonproductive cough and diarrhea.  Patient symptoms have been present for the last 3 days.  Chest pain is retrosternal pressure-like increased on exertion.  Patient's cough has been nonproductive.  Denies any recent sick contacts travel.  And diarrhea has been watery.  At least 3 episodes today.  No recent travel or sick contacts.  Denies using any antibiotics.  Patient also had a tick found on the body.  Recently patient has been having right shoulder pain secondary rotator cuff and has been receiving pain medication for the last 1 month.   Chest pain, POA NSTEMI  -history of vascular disease with history of tobacco abuse hypertension hyperlipidemia  --troponin trending up to 3000's --CTA chest, neg for PE through the proximal segmental pulmonary arterial level. --cardiology consulted on admission Plan: --cont heparin gtt --cont ASA and statin --add metop 12.5 mg BID, per card --plan for left heart cath  Sepsis 2/2 Multifocal PNA --presented with cough, dyspnea, with fever, tachypnea, leukocytosis --CT chest showed "Scattered heterogeneous and ground-glass airspace opacity throughout", consistent with infection or aspiration. --COVID neg, RVP neg.  procal elevated Plan: --start ceftriaxone --cont doxy  Diarrhea, resolved -has not been on any antibiotics recently.  Denies any recent travel or sick contacts.    Tick  bite --tick-born illness titers ordered on admission.  Started on empiric doxy on admission --cont doxy for now as part of CAP treatment  Hypertension --Hold home BP meds since BP intermittently low --start Lopressor, per card  Hyperlipidemia --cont statin  Hypokalemia --monitor and replete PRN  Current smoker --nicotine patch  aortic atherosclerosis --cont ASA and statin  Aortic stenosis, mod --followup Echo in the future   DVT prophylaxis: EU:MPNTIRW gtt Code Status: Full code  Family Communication: wife updated at bedside today  Level of care: Telemetry Cardiac Dispo:   The patient is from: home Anticipated d/c is to: home Anticipated d/c date is: 2-3 days Patient currently is not medically ready to d/c due to: pending heart cath   Subjective and Interval History:  Complained of coughing.  Wife noted abdomen large.  Pt denied abdominal pain, had BM 2-3 days ago.  Normal oral intake.  On room air.  No chest pain.   Objective: Vitals:   02/04/21 1600 02/04/21 2036 02/05/21 0606 02/05/21 0823  BP: 118/67 108/78 113/83 117/78  Pulse: 79 79 72 79  Resp: 20 16 19    Temp: 97.8 F (36.6 C) 98.4 F (36.9 C) 98.4 F (36.9 C)   TempSrc: Oral Oral Oral   SpO2: 98% 94% 95%   Weight:      Height:        Intake/Output Summary (Last 24 hours) at 02/05/2021 1559 Last data filed at 02/05/2021 0900 Gross per 24 hour  Intake 380 ml  Output --  Net 380 ml   Filed Weights   02/03/21 1714 02/04/21 0630  Weight: 116.6 kg 111.9 kg  Examination:   Constitutional: NAD, AAOx3, sitting in chair eating HEENT: conjunctivae and lids normal, EOMI CV: No cyanosis.   RESP: normal respiratory effort, on RA Extremities: trace pitting edema in BLE SKIN: warm, dry Neuro: II - XII grossly intact.   Psych: Normal mood and affect.  Appropriate judgement and reason   Data Reviewed: I have personally reviewed following labs and imaging studies  CBC: Recent Labs  Lab  02/03/21 1654 02/04/21 0305 02/05/21 0418  WBC 11.9* 11.2* 16.0*  NEUTROABS 9.7* 10.2*  --   HGB 15.1 14.0 14.2  HCT 43.2 40.9 41.6  MCV 98.6 101.2* 99.3  PLT 150 151 166   Basic Metabolic Panel: Recent Labs  Lab 02/03/21 1654 02/04/21 0305 02/05/21 0418  NA 134* 133* 136  K 3.8 3.2* 4.2  CL 104 101 107  CO2 23 18* 22  GLUCOSE 121* 245* 109*  BUN 11 13 20   CREATININE 1.01 1.38* 0.85  CALCIUM 10.3 9.9 10.1  MG  --   --  2.0   GFR: Estimated Creatinine Clearance: 108.7 mL/min (by C-G formula based on SCr of 0.85 mg/dL). Liver Function Tests: Recent Labs  Lab 02/03/21 1654 02/04/21 0305  AST 21 42*  ALT 26 29  ALKPHOS 83 78  BILITOT 1.6* 0.9  PROT 7.0 6.3*  ALBUMIN 3.8 3.2*   No results for input(s): LIPASE, AMYLASE in the last 168 hours. No results for input(s): AMMONIA in the last 168 hours. Coagulation Profile: Recent Labs  Lab 02/03/21 1939  INR 1.1   Cardiac Enzymes: No results for input(s): CKTOTAL, CKMB, CKMBINDEX, TROPONINI in the last 168 hours. BNP (last 3 results) No results for input(s): PROBNP in the last 8760 hours. HbA1C: No results for input(s): HGBA1C in the last 72 hours. CBG: No results for input(s): GLUCAP in the last 168 hours. Lipid Profile: No results for input(s): CHOL, HDL, LDLCALC, TRIG, CHOLHDL, LDLDIRECT in the last 72 hours. Thyroid Function Tests: No results for input(s): TSH, T4TOTAL, FREET4, T3FREE, THYROIDAB in the last 72 hours. Anemia Panel: No results for input(s): VITAMINB12, FOLATE, FERRITIN, TIBC, IRON, RETICCTPCT in the last 72 hours. Sepsis Labs: Recent Labs  Lab 02/03/21 1654 02/04/21 1917  PROCALCITON  --  0.96  LATICACIDVEN 1.1  --     Recent Results (from the past 240 hour(s))  Blood culture (routine x 2)     Status: None (Preliminary result)   Collection Time: 02/03/21  4:57 PM   Specimen: BLOOD  Result Value Ref Range Status   Specimen Description BLOOD LEFT ANTECUBITAL  Final   Special Requests    Final    BOTTLES DRAWN AEROBIC AND ANAEROBIC Blood Culture adequate volume   Culture   Final    NO GROWTH 2 DAYS Performed at [facility], [address] [place], [place] [address]    Report Status PENDING  Incomplete  Urine culture     Status: Abnormal (Preliminary result)   Collection Time: 02/03/21  7:39 PM   Specimen: In/Out Cath Urine  Result Value Ref Range Status   Specimen Description IN/OUT CATH URINE  Final   Special Requests NONE  Final   Culture (A)  Final    800 COLONIES/mL STAPHYLOCOCCUS EPIDERMIDIS SUSCEPTIBILITIES TO FOLLOW Performed at [facility], [place] [address], [place], [place] [address]    Report Status PENDING  Incomplete  Blood culture (routine x 2)     Status: None (Preliminary result)   Collection Time: 02/03/21  8:10 PM  Specimen: BLOOD  Result Value Ref Range Status   Specimen Description BLOOD  Final   Special Requests NONE  Final   Culture   Final    NO GROWTH 2 DAYS Performed at [facility], [address] [place], [place] [address]    Report Status PENDING  Incomplete  Resp Panel by RT-PCR (Flu A&B, Covid) Nasopharyngeal Swab     Status: None   Collection Time: 02/03/21  9:20 PM   Specimen: Nasopharyngeal Swab; Nasopharyngeal(NP) swabs in vial transport medium  Result Value Ref Range Status   SARS Coronavirus 2 by RT PCR NEGATIVE NEGATIVE Final    Comment: (NOTE) SARS-CoV-2 target nucleic acids are NOT DETECTED.  The SARS-CoV-2 RNA is generally detectable in upper respiratory specimens during the acute phase of infection. The lowest concentration of SARS-CoV-2 viral copies this assay can detect is 138 copies/mL. A negative result does not preclude SARS-Cov-2 infection and should not be used as the sole basis for treatment or other patient management decisions. A negative result may occur with  improper specimen collection/handling, submission of specimen other than nasopharyngeal swab, presence of viral mutation(s)  within the areas targeted by this assay, and inadequate number of viral copies(<138 copies/mL). A negative result must be combined with clinical observations, patient history, and epidemiological information. The expected result is Negative.  Fact Sheet for Patients:  EntrepreneurPulse.com.au  Fact Sheet for Healthcare Providers:  IncredibleEmployment.be  This test is no t yet approved or cleared by the [place] FDA and  has been authorized for detection and/or diagnosis of SARS-CoV-2 by FDA under an Emergency Use Authorization (EUA). This EUA will remain  in effect (meaning this test can be used) for the duration of the COVID-19 declaration under Section 564(b)(1) of the Act, 21 U.S.C.section 360bbb-3(b)(1), unless the authorization is terminated  or revoked sooner.       Influenza A by PCR NEGATIVE NEGATIVE Final   Influenza B by PCR NEGATIVE NEGATIVE Final    Comment: (NOTE) The Xpert Xpress SARS-CoV-2/FLU/RSV plus assay is intended as an aid in the diagnosis of influenza from Nasopharyngeal swab specimens and should not be used as a sole basis for treatment. Nasal washings and aspirates are unacceptable for Xpert Xpress SARS-CoV-2/FLU/RSV testing.  Fact Sheet for Patients: EntrepreneurPulse.com.au  Fact Sheet for Healthcare Providers: IncredibleEmployment.be  This test is not yet approved or cleared by the [place] FDA and has been authorized for detection and/or diagnosis of SARS-CoV-2 by FDA under an Emergency Use Authorization (EUA). This EUA will remain in effect (meaning this test can be used) for the duration of the COVID-19 declaration under Section 564(b)(1) of the Act, 21 U.S.C. section 360bbb-3(b)(1), unless the authorization is terminated or revoked.  Performed at [facility], [place] [address] [place], [place] [address]   Respiratory (~20 pathogens) panel by PCR      Status: None   Collection Time: 02/04/21  5:44 AM   Specimen: Nasopharyngeal Swab; Respiratory  Result Value Ref Range Status   Adenovirus NOT DETECTED NOT DETECTED Final   Coronavirus 229E NOT DETECTED NOT DETECTED Final    Comment: (NOTE) The Coronavirus on the Respiratory Panel, DOES NOT test for the novel  Coronavirus (2019 nCoV)    Coronavirus HKU1 NOT DETECTED NOT DETECTED Final   Coronavirus NL63 NOT DETECTED NOT DETECTED Final   Coronavirus OC43 NOT DETECTED NOT DETECTED Final   Metapneumovirus NOT DETECTED NOT DETECTED Final   Rhinovirus / Enterovirus NOT DETECTED NOT DETECTED Final  Influenza A NOT DETECTED NOT DETECTED Final   Influenza B NOT DETECTED NOT DETECTED Final   Parainfluenza Virus 1 NOT DETECTED NOT DETECTED Final   Parainfluenza Virus 2 NOT DETECTED NOT DETECTED Final   Parainfluenza Virus 3 NOT DETECTED NOT DETECTED Final   Parainfluenza Virus 4 NOT DETECTED NOT DETECTED Final   Respiratory Syncytial Virus NOT DETECTED NOT DETECTED Final   Bordetella pertussis NOT DETECTED NOT DETECTED Final   Bordetella Parapertussis NOT DETECTED NOT DETECTED Final   Chlamydophila pneumoniae NOT DETECTED NOT DETECTED Final   Mycoplasma pneumoniae NOT DETECTED NOT DETECTED Final    Comment: Performed at [facility], [address] [place], [place] [address]      Radiology Studies: CT ANGIO CHEST PE W OR WO CONTRAST  Result Date: 02/04/2021 CLINICAL DATA:  Chest pain, shortness of breath, concern for PE EXAM: CT ANGIOGRAPHY CHEST WITH CONTRAST TECHNIQUE: Multidetector CT imaging of the chest was performed using the standard protocol during bolus administration of intravenous contrast. Multiplanar CT image reconstructions and MIPs were obtained to evaluate the vascular anatomy. CONTRAST:  32mL OMNIPAQUE IOHEXOL 350 MG/ML SOLN COMPARISON:  None. FINDINGS: Cardiovascular: Examination for pulmonary embolism is limited by breath motion artifact. Within this limitation,  no evidence of pulmonary embolism through the proximal segmental pulmonary arterial level. Three-vessel coronary artery calcification. No pericardial effusion. Severe, mixed aortic atherosclerosis. Mediastinum/Nodes: No enlarged mediastinal, hilar, or axillary lymph nodes. Thyroid gland, trachea, and esophagus demonstrate no significant findings. Lungs/Pleura: There is scattered heterogeneous and ground-glass airspace opacity throughout, for example in the dependent right lower lobe (series 9, image 61) and in the posterior left upper lobe (series 9, image 40). Evaluation of airspace disease is likewise somewhat limited by breath motion artifact. No pleural effusion or pneumothorax. Upper Abdomen: No acute abnormality. Musculoskeletal: No chest wall abnormality. No acute or significant osseous findings. Review of the MIP images confirms the above findings. IMPRESSION: 1. Examination for pulmonary embolism is limited by breath motion artifact. Within this limitation, no evidence of pulmonary embolism through the proximal segmental pulmonary arterial level. 2. Scattered heterogeneous and ground-glass airspace opacity throughout, for example in the dependent right lower lobe and in the posterior left upper lobe. Evaluation of airspace disease is likewise somewhat limited by breath motion artifact. Findings are consistent with multifocal infection or aspiration. 3. Coronary artery disease. Aortic Atherosclerosis (ICD10-I70.0). Electronically Signed   By: Eddie Candle M.D.   On: 02/04/2021 17:18   DG Chest Port 1 View  Result Date: 02/03/2021 CLINICAL DATA:  Chest pain and diarrhea EXAM: PORTABLE CHEST 1 VIEW COMPARISON:  07/17/2007 FINDINGS: The heart size and mediastinal contours are within normal limits. Both lungs are clear. The visualized skeletal structures are unremarkable. IMPRESSION: No active disease. Electronically Signed   By: Donavan Foil M.D.   On: 02/03/2021 19:08   ECHOCARDIOGRAM COMPLETE  Result  Date: 02/04/2021    ECHOCARDIOGRAM REPORT   Patient Name:   SRIYAN CUTTING Date of Exam: 02/04/2021 Medical Rec #:  [id number]        Height:       71.0 in Accession #:    [id number]       Weight:       246.8 lb Date of Birth:  19-Jul-1956        BSA:          2.306 m Patient Age:    71 years         BP:  107/79 mmHg Patient Gender: M                HR:           87 bpm. Exam Location:  Inpatient Procedure: 2D Echo, Cardiac Doppler and Color Doppler Indications:    R07.9* Chest pain, unspecified  History:        Patient has prior history of Echocardiogram examinations, most                 recent 04/29/2020. Signs/Symptoms:Shortness of Breath. Coughing.  Sonographer:    Merrie Roof RDCS Referring Phys: [place]  1. Left ventricular ejection fraction, by estimation, is 60 to 65%. The left ventricle has normal function. The left ventricle has no regional wall motion abnormalities. Left ventricular diastolic parameters are consistent with Grade I diastolic dysfunction (impaired relaxation). Elevated left atrial pressure.  2. Right ventricular systolic function is normal. The right ventricular size is normal.  3. The mitral valve is normal in structure. No evidence of mitral valve regurgitation. No evidence of mitral stenosis.  4. The aortic valve has an indeterminant number of cusps. Aortic valve regurgitation is not visualized. Moderate aortic valve stenosis.  5. The inferior vena cava is normal in size with greater than 50% respiratory variability, suggesting right atrial pressure of 3 mmHg. FINDINGS  Left Ventricle: Left ventricular ejection fraction, by estimation, is 60 to 65%. The left ventricle has normal function. The left ventricle has no regional wall motion abnormalities. The left ventricular internal cavity size was normal in size. There is  no left ventricular hypertrophy. Left ventricular diastolic parameters are consistent with Grade I diastolic dysfunction (impaired  relaxation). Elevated left atrial pressure. Right Ventricle: The right ventricular size is normal. Right ventricular systolic function is normal. Left Atrium: Left atrial size was normal in size. Right Atrium: Right atrial size was normal in size. Pericardium: There is no evidence of pericardial effusion. Mitral Valve: The mitral valve is normal in structure. Mild mitral annular calcification. No evidence of mitral valve regurgitation. No evidence of mitral valve stenosis. Tricuspid Valve: The tricuspid valve is normal in structure. Tricuspid valve regurgitation is trivial. No evidence of tricuspid stenosis. Aortic Valve: The aortic valve has an indeterminant number of cusps. Aortic valve regurgitation is not visualized. Moderate aortic stenosis is present. Aortic valve mean gradient measures 25.8 mmHg. Aortic valve peak gradient measures 45.2 mmHg. Aortic valve area, by VTI measures 1.28 cm. Pulmonic Valve: The pulmonic valve was not well visualized. Pulmonic valve regurgitation is not visualized. No evidence of pulmonic stenosis. Aorta: The aortic root is normal in size and structure. Venous: The inferior vena cava is normal in size with greater than 50% respiratory variability, suggesting right atrial pressure of 3 mmHg.   LEFT VENTRICLE PLAX 2D LVIDd:         5.30 cm  Diastology LVIDs:         4.00 cm  LV e' medial:    9.46 cm/s LV PW:         1.00 cm  LV E/e' medial:  11.3 LV IVS:        1.20 cm  LV e' lateral:   6.31 cm/s LVOT diam:     2.10 cm  LV E/e' lateral: 17.0 LV SV:         84 LV SV Index:   37 LVOT Area:     3.46 cm  RIGHT VENTRICLE RV Basal diam:  3.90 cm LEFT ATRIUM  Index       RIGHT ATRIUM           Index LA diam:      3.00 cm 1.30 cm/m  RA Area:     24.60 cm LA Vol (A2C): 83.9 ml 36.39 ml/m RA Volume:   85.30 ml  37.00 ml/m LA Vol (A4C): 35.6 ml 15.44 ml/m  AORTIC VALVE AV Area (Vmax):    1.17 cm AV Area (Vmean):   1.16 cm AV Area (VTI):     1.28 cm AV Vmax:           336.20  cm/s AV Vmean:          237.200 cm/s AV VTI:            0.658 m AV Peak Grad:      45.2 mmHg AV Mean Grad:      25.8 mmHg LVOT Vmax:         114.00 cm/s LVOT Vmean:        79.550 cm/s LVOT VTI:          0.243 m LVOT/AV VTI ratio: 0.37  AORTA Ao Root diam: 3.70 cm MITRAL VALVE MV Area (PHT): 2.96 cm     SHUNTS MV Decel Time: 256 msec     Systemic VTI:  0.24 m MV E velocity: 107.00 cm/s  Systemic Diam: 2.10 cm MV A velocity: 116.00 cm/s MV E/A ratio:  0.92 [name] MD Electronically signed by [name] MD Signature Date/Time: 02/04/2021/12:00:26 PM    Final      Scheduled Meds: . aspirin  81 mg Oral Daily  . famotidine  20 mg Oral Daily  . metoprolol tartrate  12.5 mg Oral BID  . nicotine  21 mg Transdermal Daily  . rosuvastatin  40 mg Oral Daily  . sodium chloride flush  3 mL Intravenous Q12H   Continuous Infusions: . cefTRIAXone (ROCEPHIN)  IV 2 g (02/05/21 1328)  . doxycycline (VIBRAMYCIN) IV 100 mg (02/05/21 1144)  . heparin 2,200 Units/hr (02/05/21 1550)     LOS: 0 days     Enzo Bi, MD [facility] If 7PM-7AM, please contact night-coverage 02/05/2021, 3:59 PM

## 2021-02-05 NOTE — Progress Notes (Signed)
ANTICOAGULATION CONSULT NOTE - Follow Up Consult  Pharmacy Consult for Heparin Indication: chest pain/ACS  Labs: Recent Labs    02/03/21 1654 02/03/21 1939 02/04/21 0305 02/04/21 0506 02/04/21 1355 02/04/21 1832 02/04/21 2148 02/05/21 0418  HGB 15.1  --  14.0  --   --   --   --  14.2  HCT 43.2  --  40.9  --   --   --   --  41.6  PLT 150  --  151  --   --   --   --  184  APTT  --  33  --   --   --   --   --   --   LABPROT  --  14.6  --   --   --   --   --   --   INR  --  1.1  --   --   --   --   --   --   HEPARINUNFRC  --   --   --   --   --  <0.10*  --  <0.10*  CREATININE 1.01  --  1.38*  --   --   --   --   --   TROPONINIHS 12 14 216*   < > 3,228* 2,610* 2,373*  --    < > = values in this interval not displayed.    Assessment: 65 yo male with a past medical history of vascular disease, hypertension, hyperlipidemia, and tobacco abuse presents with chest pain. PTA the patient is not on anticoagulation. Pharmacy is consulted to dose heparin.  Heparin level remains undetectable.  Hg 14.2, PTLC 184  Goal of Therapy:  Heparin level 0.3-0.7 units/ml Monitor platelets by anticoagulation protocol: Yes   Plan:  -Heparin 3000 units IV bolus  -Increase heparin infusion to 2000 units/hr -Obtain a 6-hr heparin level -Monitor a daily heparin level and CBC -Monitor for signs and symptoms of bleeding  Thanks for allowing pharmacy to be a part of this patient's care.  Excell Seltzer, PharmD Clinical Pharmacist

## 2021-02-05 NOTE — Progress Notes (Signed)
ANTICOAGULATION CONSULT NOTE - Initial Consult  Pharmacy Consult for Heparin Indication: chest pain/ACS  No Known Allergies  Patient Measurements: Height: 5\' 11"  (180.3 cm) Weight: 111.9 kg (246 lb 12.8 oz) IBW/kg (Calculated) : 75.3 kg Heparin Dosing Weight: 99.5 kg  Vital Signs: Temp: 98.4 F (36.9 C) (05/29 0606) Temp Source: Oral (05/29 0606) BP: 113/83 (05/29 0606) Pulse Rate: 72 (05/29 0606)  Labs: Recent Labs    02/03/21 1654 02/03/21 1939 02/04/21 0305 02/04/21 0506 02/04/21 1355 02/04/21 1832 02/04/21 2148 02/05/21 0418  HGB 15.1  --  14.0  --   --   --   --  14.2  HCT 43.2  --  40.9  --   --   --   --  41.6  PLT 150  --  151  --   --   --   --  184  APTT  --  33  --   --   --   --   --   --   LABPROT  --  14.6  --   --   --   --   --   --   INR  --  1.1  --   --   --   --   --   --   HEPARINUNFRC  --   --   --   --   --  <0.10*  --  <0.10*  CREATININE 1.01  --  1.38*  --   --   --   --  0.85  TROPONINIHS 12 14 216*   < > 3,228* 2,610* 2,373*  --    < > = values in this interval not displayed.    Estimated Creatinine Clearance: 108.7 mL/min (by C-G formula based on SCr of 0.85 mg/dL).   Medical History: Past Medical History:  Diagnosis Date  . BMI 34.0-34.9,adult   . Headache   . HTN (hypertension)   . Hyperlipidemia     Assessment: 65 yo male with a past medical history of vascular disease, hypertension, hyperlipidemia, and tobacco abuse presents with chest pain. PTA the patient is not on anticoagulation. Pharmacy is consulted to dose heparin.  Heparin level is subtherapeutic at 0.25 while running at 2000 units/hr. Per the RN, there have been no issues with the infusion and the patient is without signs or symptoms of bleeding. Hgb and platelets are WNL and stable.   Goal of Therapy:  Heparin level 0.3-0.7 units/ml Monitor platelets by anticoagulation protocol: Yes   Plan:  -Increase heparin IV to 2200 units/hr -Re-check heparin level in 6  hours -Monitor a daily heparin level and CBC -Monitor for signs and symptoms of bleeding  Shauna Hugh, PharmD, [place]  PGY-1 Pharmacy Resident 02/05/2021 7:15 AM  Please check [email or web address] for unit-specific pharmacy phone numbers.

## 2021-02-06 ENCOUNTER — Inpatient Hospital Stay (HOSPITAL_COMMUNITY): Payer: 59

## 2021-02-06 DIAGNOSIS — I214 Non-ST elevation (NSTEMI) myocardial infarction: Secondary | ICD-10-CM | POA: Diagnosis not present

## 2021-02-06 DIAGNOSIS — R072 Precordial pain: Secondary | ICD-10-CM | POA: Diagnosis not present

## 2021-02-06 LAB — CBC
HCT: 39.2 % (ref 39.0–52.0)
Hemoglobin: 13.4 g/dL (ref 13.0–17.0)
MCH: 34.5 pg — ABNORMAL HIGH (ref 26.0–34.0)
MCHC: 34.2 g/dL (ref 30.0–36.0)
MCV: 101 fL — ABNORMAL HIGH (ref 80.0–100.0)
Platelets: 163 10*3/uL (ref 150–400)
RBC: 3.88 MIL/uL — ABNORMAL LOW (ref 4.22–5.81)
RDW: 13 % (ref 11.5–15.5)
WBC: 12 10*3/uL — ABNORMAL HIGH (ref 4.0–10.5)
nRBC: 0 % (ref 0.0–0.2)

## 2021-02-06 LAB — URINE CULTURE: Culture: 800 — AB

## 2021-02-06 LAB — MAGNESIUM: Magnesium: 1.9 mg/dL (ref 1.7–2.4)

## 2021-02-06 LAB — HEPARIN LEVEL (UNFRACTIONATED)
Heparin Unfractionated: 0.35 IU/mL (ref 0.30–0.70)
Heparin Unfractionated: 0.59 IU/mL (ref 0.30–0.70)

## 2021-02-06 LAB — BASIC METABOLIC PANEL
Anion gap: 6 (ref 5–15)
BUN: 20 mg/dL (ref 8–23)
CO2: 26 mmol/L (ref 22–32)
Calcium: 9.5 mg/dL (ref 8.9–10.3)
Chloride: 104 mmol/L (ref 98–111)
Creatinine, Ser: 0.84 mg/dL (ref 0.61–1.24)
GFR, Estimated: >60 mL/min (ref >60)
Glucose, Bld: 83 mg/dL (ref 70–99)
Potassium: 3.9 mmol/L (ref 3.5–5.1)
Sodium: 136 mmol/L (ref 135–145)

## 2021-02-06 MED ORDER — SODIUM CHLORIDE 0.9 % IV SOLN: 250.0000 mL | INTRAVENOUS | Status: DC | PRN

## 2021-02-06 MED ORDER — SODIUM CHLORIDE 0.9% FLUSH: 3.0000 mL | INTRAVENOUS | Status: DC | PRN

## 2021-02-06 MED ORDER — ASPIRIN 81 MG PO CHEW
81.0000 mg | CHEWABLE_TABLET | ORAL | Status: AC
  Administered 2021-02-07: 81 mg via ORAL
  Filled 2021-02-06: qty 1

## 2021-02-06 MED ORDER — BENZONATATE 100 MG PO CAPS
100.0000 mg | ORAL_CAPSULE | Freq: Three times a day (TID) | ORAL | Status: DC | PRN
  Administered 2021-02-06: 100 mg via ORAL
  Filled 2021-02-06: qty 1

## 2021-02-06 MED ORDER — SODIUM CHLORIDE 0.9 % WEIGHT BASED INFUSION
1.0000 mL/kg/h | INTRAVENOUS | Status: DC
  Administered 2021-02-06 – 2021-02-07 (×3): 1 mL/kg/h via INTRAVENOUS

## 2021-02-06 NOTE — Progress Notes (Signed)
PROGRESS NOTE    [name]  UMP:[id number] DOB: 1956/03/31 DOA: 02/03/2021 PCP: Orpah Melter, MD  [id number]   Assessment & Plan:   Principal Problem:   Chest pain Active Problems:   Acute bronchitis   Fever in adult   Diarrhea   Essential hypertension   Hyperlipidemia   HTN (hypertension)   BMI 34.0-34.9,adult   NSTEMI (non-ST elevated myocardial infarction) ([place])   [name] is a 65 y.o. male with history of hypertension, hyperlipidemia, vascular disease, tobacco abuse presents to the ER with complaints of having chest pain with nonproductive cough and diarrhea.  Patient symptoms have been present for the last 3 days.  Chest pain is retrosternal pressure-like increased on exertion.  Patient's cough has been nonproductive.  Denies any recent sick contacts travel.  And diarrhea has been watery.  At least 3 episodes today.  No recent travel or sick contacts.  Denies using any antibiotics.  Patient also had a tick found on the body.  Recently patient has been having right shoulder pain secondary rotator cuff and has been receiving pain medication for the last 1 month.   Chest pain, POA NSTEMI  -history of vascular disease with history of tobacco abuse hypertension hyperlipidemia  --troponin trending up to 3000's --CTA chest, neg for PE through the proximal segmental pulmonary arterial level. --cardiology consulted on admission Plan: --cont heparin gtt --cont ASA and statin --cont Lopressor 12.5 mg BID (new) --plan for left heart cath Tuesday  Sepsis 2/2 Multifocal PNA --presented with cough, dyspnea, with fever, tachypnea, leukocytosis --CT chest showed "Scattered heterogeneous and ground-glass airspace opacity throughout", consistent with infection or aspiration. --COVID neg, RVP neg.  procal elevated Plan: --cont ceftriaxone and doxy  Diarrhea, resolved -has not been on any antibiotics recently.  Denies any recent travel or sick contacts.    Tick  bite --tick-born illness titers ordered on admission.  Started on empiric doxy on admission --cont doxy for now as part of CAP treatment  Hypertension --Hold home BP meds since BP intermittently low --cont Lopressor 12.5 mg BID (new)  Hyperlipidemia --cont statin  Hypokalemia --monitor and replete PRN  Current smoker --nicotine patch  aortic atherosclerosis --cont ASA and statin  Aortic stenosis, mod --followup Echo in the future   DVT prophylaxis: QM:[id number] gtt Code Status: Full code  Family Communication: wife updated at bedside today  Level of care: Telemetry Cardiac Dispo:   The patient is from: home Anticipated d/c is to: home Anticipated d/c date is: 1-2 days  Patient currently is not medically ready to d/c due to: pending heart cath   Subjective and Interval History:  Had CP last night.  Resolved this morning, but tired from not sleeping well.  Still having cough.  Wife noted pt's voice hoarse.   Objective: Vitals:   02/05/21 2001 02/05/21 2201 02/06/21 0200 02/06/21 1140  BP: 121/80 136/79 121/88 119/88  Pulse: 75 74 70 60  Resp: 18  20 18   Temp: 98.3 F (36.8 C)   98.2 F (36.8 C)  TempSrc: Oral   Oral  SpO2: 99%  96% 100%  Weight:      Height:        Intake/Output Summary (Last 24 hours) at 02/06/2021 1735 Last data filed at 02/06/2021 0700 Gross per 24 hour  Intake 1284.3 ml  Output --  Net 1284.3 ml   Filed Weights   02/03/21 1714 02/04/21 0630  Weight: 116.6 kg 111.9 kg    Examination:   Constitutional: NAD, AAOx3  HEENT: conjunctivae and lids normal, EOMI, voice more hoarse CV: No cyanosis.   RESP: normal respiratory effort, on RA Extremities: No pitting edema in BLE SKIN: warm, dry Neuro: II - XII grossly intact.   Psych: Normal mood and affect.  Appropriate judgement and reason   Data Reviewed: I have personally reviewed following labs and imaging studies  CBC: Recent Labs  Lab 02/03/21 1654 02/04/21 0305  02/05/21 0418 02/06/21 0129  WBC 11.9* 11.2* 16.0* 12.0*  NEUTROABS 9.7* 10.2*  --   --   HGB 15.1 14.0 14.2 13.4  HCT 43.2 40.9 41.6 39.2  MCV 98.6 101.2* 99.3 101.0*  PLT 150 151 184 450   Basic Metabolic Panel: Recent Labs  Lab 02/03/21 1654 02/04/21 0305 02/05/21 0418 02/06/21 0129  NA 134* 133* 136 136  K 3.8 3.2* 4.2 3.9  CL 104 101 107 104  CO2 23 18* 22 26  GLUCOSE 121* 245* 109* 83  BUN 11 13 20 20   CREATININE 1.01 1.38* 0.85 0.84  CALCIUM 10.3 9.9 10.1 9.5  MG  --   --  2.0 1.9   GFR: Estimated Creatinine Clearance: 110 mL/min (by C-G formula based on SCr of 0.84 mg/dL). Liver Function Tests: Recent Labs  Lab 02/03/21 1654 02/04/21 0305  AST 21 42*  ALT 26 29  ALKPHOS 83 78  BILITOT 1.6* 0.9  PROT 7.0 6.3*  ALBUMIN 3.8 3.2*   No results for input(s): LIPASE, AMYLASE in the last 168 hours. No results for input(s): AMMONIA in the last 168 hours. Coagulation Profile: Recent Labs  Lab 02/03/21 1939  INR 1.1   Cardiac Enzymes: No results for input(s): CKTOTAL, CKMB, CKMBINDEX, TROPONINI in the last 168 hours. BNP (last 3 results) No results for input(s): PROBNP in the last 8760 hours. HbA1C: No results for input(s): HGBA1C in the last 72 hours. CBG: No results for input(s): GLUCAP in the last 168 hours. Lipid Profile: No results for input(s): CHOL, HDL, LDLCALC, TRIG, CHOLHDL, LDLDIRECT in the last 72 hours. Thyroid Function Tests: No results for input(s): TSH, T4TOTAL, FREET4, T3FREE, THYROIDAB in the last 72 hours. Anemia Panel: No results for input(s): VITAMINB12, FOLATE, FERRITIN, TIBC, IRON, RETICCTPCT in the last 72 hours. Sepsis Labs: Recent Labs  Lab 02/03/21 1654 02/04/21 1917  PROCALCITON  --  0.96  LATICACIDVEN 1.1  --     Recent Results (from the past 240 hour(s))  Blood culture (routine x 2)     Status: None (Preliminary result)   Collection Time: 02/03/21  4:57 PM   Specimen: BLOOD  Result Value Ref Range Status    Specimen Description BLOOD LEFT ANTECUBITAL  Final   Special Requests   Final    BOTTLES DRAWN AEROBIC AND ANAEROBIC Blood Culture adequate volume   Culture   Final    NO GROWTH 3 DAYS Performed at [facility], [address], [place], [place] [address]    Report Status PENDING  Incomplete  Urine culture     Status: Abnormal   Collection Time: 02/03/21  7:39 PM   Specimen: In/Out Cath Urine  Result Value Ref Range Status   Specimen Description IN/OUT CATH URINE  Final   Special Requests   Final    NONE Performed at [facility], [place] [address], [place], [place] [address]    Culture 800 COLONIES/mL STAPHYLOCOCCUS EPIDERMIDIS (A)  Final   Report Status 02/06/2021 FINAL  Final   Organism ID, Bacteria STAPHYLOCOCCUS EPIDERMIDIS (A)  Final  Susceptibility   Staphylococcus epidermidis - MIC*    CIPROFLOXACIN <=0.5 SENSITIVE Sensitive     GENTAMICIN <=0.5 SENSITIVE Sensitive     NITROFURANTOIN <=16 SENSITIVE Sensitive     OXACILLIN <=0.25 SENSITIVE Sensitive     TETRACYCLINE <=1 SENSITIVE Sensitive     VANCOMYCIN <=0.5 SENSITIVE Sensitive     TRIMETH/SULFA <=10 SENSITIVE Sensitive     CLINDAMYCIN <=0.25 SENSITIVE Sensitive     RIFAMPIN <=0.5 SENSITIVE Sensitive     Inducible Clindamycin NEGATIVE Sensitive     * 800 COLONIES/mL STAPHYLOCOCCUS EPIDERMIDIS  Blood culture (routine x 2)     Status: None (Preliminary result)   Collection Time: 02/03/21  8:10 PM   Specimen: BLOOD  Result Value Ref Range Status   Specimen Description BLOOD  Final   Special Requests NONE  Final   Culture   Final    NO GROWTH 3 DAYS Performed at [facility], [place] [address], [place], [place] [address]    Report Status PENDING  Incomplete  Resp Panel by RT-PCR (Flu A&B, Covid) Nasopharyngeal Swab     Status: None   Collection Time: 02/03/21  9:20 PM   Specimen: Nasopharyngeal Swab; Nasopharyngeal(NP) swabs in vial transport medium  Result Value Ref Range Status   SARS Coronavirus  2 by RT PCR NEGATIVE NEGATIVE Final    Comment: (NOTE) SARS-CoV-2 target nucleic acids are NOT DETECTED.  The SARS-CoV-2 RNA is generally detectable in upper respiratory specimens during the acute phase of infection. The lowest concentration of SARS-CoV-2 viral copies this assay can detect is 138 copies/mL. A negative result does not preclude SARS-Cov-2 infection and should not be used as the sole basis for treatment or other patient management decisions. A negative result may occur with  improper specimen collection/handling, submission of specimen other than nasopharyngeal swab, presence of viral mutation(s) within the areas targeted by this assay, and inadequate number of viral copies(<138 copies/mL). A negative result must be combined with clinical observations, patient history, and epidemiological information. The expected result is Negative.  Fact Sheet for Patients:  EntrepreneurPulse.com.au  Fact Sheet for Healthcare Providers:  IncredibleEmployment.be  This test is no t yet approved or cleared by the [place] FDA and  has been authorized for detection and/or diagnosis of SARS-CoV-2 by FDA under an Emergency Use Authorization (EUA). This EUA will remain  in effect (meaning this test can be used) for the duration of the COVID-19 declaration under Section 564(b)(1) of the Act, 21 U.S.C.section 360bbb-3(b)(1), unless the authorization is terminated  or revoked sooner.       Influenza A by PCR NEGATIVE NEGATIVE Final   Influenza B by PCR NEGATIVE NEGATIVE Final    Comment: (NOTE) The Xpert Xpress SARS-CoV-2/FLU/RSV plus assay is intended as an aid in the diagnosis of influenza from Nasopharyngeal swab specimens and should not be used as a sole basis for treatment. Nasal washings and aspirates are unacceptable for Xpert Xpress SARS-CoV-2/FLU/RSV testing.  Fact Sheet for Patients: EntrepreneurPulse.com.au  Fact  Sheet for Healthcare Providers: IncredibleEmployment.be  This test is not yet approved or cleared by the [place] FDA and has been authorized for detection and/or diagnosis of SARS-CoV-2 by FDA under an Emergency Use Authorization (EUA). This EUA will remain in effect (meaning this test can be used) for the duration of the COVID-19 declaration under Section 564(b)(1) of the Act, 21 U.S.C. section 360bbb-3(b)(1), unless the authorization is terminated or revoked.  Performed at [facility], [place] [address] [place], [place]  [address]   Respiratory (~20 pathogens) panel by PCR     Status: None   Collection Time: 02/04/21  5:44 AM   Specimen: Nasopharyngeal Swab; Respiratory  Result Value Ref Range Status   Adenovirus NOT DETECTED NOT DETECTED Final   Coronavirus 229E NOT DETECTED NOT DETECTED Final    Comment: (NOTE) The Coronavirus on the Respiratory Panel, DOES NOT test for the novel  Coronavirus (2019 nCoV)    Coronavirus HKU1 NOT DETECTED NOT DETECTED Final   Coronavirus NL63 NOT DETECTED NOT DETECTED Final   Coronavirus OC43 NOT DETECTED NOT DETECTED Final   Metapneumovirus NOT DETECTED NOT DETECTED Final   Rhinovirus / Enterovirus NOT DETECTED NOT DETECTED Final   Influenza A NOT DETECTED NOT DETECTED Final   Influenza B NOT DETECTED NOT DETECTED Final   Parainfluenza Virus 1 NOT DETECTED NOT DETECTED Final   Parainfluenza Virus 2 NOT DETECTED NOT DETECTED Final   Parainfluenza Virus 3 NOT DETECTED NOT DETECTED Final   Parainfluenza Virus 4 NOT DETECTED NOT DETECTED Final   Respiratory Syncytial Virus NOT DETECTED NOT DETECTED Final   Bordetella pertussis NOT DETECTED NOT DETECTED Final   Bordetella Parapertussis NOT DETECTED NOT DETECTED Final   Chlamydophila pneumoniae NOT DETECTED NOT DETECTED Final   Mycoplasma pneumoniae NOT DETECTED NOT DETECTED Final    Comment: Performed at [facility], [address] [place], [place]  [address]      Radiology Studies: DG CHEST PORT 1 VIEW  Result Date: 02/06/2021 CLINICAL DATA:  Cough EXAM: PORTABLE CHEST 1 VIEW COMPARISON:  02/04/2021 FINDINGS: Single frontal view of the chest demonstrates an unremarkable cardiac silhouette. Scattered areas of ground-glass airspace disease are seen within the mid and lower lung zones, similar to previous CT exam. No effusion or pneumothorax. No acute bony abnormalities. IMPRESSION: 1. Stable scattered bibasilar ground-glass airspace disease, which may reflect multifocal infection or aspiration. Electronically Signed   By: Randa Ngo M.D.   On: 02/06/2021 03:04     Scheduled Meds: . aspirin  81 mg Oral Daily  . [START ON 02/07/2021] aspirin  81 mg Oral Pre-Cath  . famotidine  20 mg Oral Daily  . metoprolol tartrate  12.5 mg Oral BID  . nicotine  21 mg Transdermal Daily  . rosuvastatin  40 mg Oral Daily  . sodium chloride flush  3 mL Intravenous Q12H   Continuous Infusions: . sodium chloride    . sodium chloride    . cefTRIAXone (ROCEPHIN)  IV 2 g (02/06/21 1152)  . doxycycline (VIBRAMYCIN) IV 100 mg (02/06/21 0908)  . heparin 2,300 Units/hr (02/06/21 1430)     LOS: 1 day     Enzo Bi, MD [facility] If 7PM-7AM, please contact night-coverage 02/06/2021, 5:35 PM

## 2021-02-06 NOTE — Progress Notes (Addendum)
ANTICOAGULATION CONSULT NOTE  Pharmacy Consult for Heparin Indication: chest pain/ACS  No Known Allergies  Patient Measurements: Height: 5\' 11"  (180.3 cm) Weight: 111.9 kg (246 lb 12.8 oz) IBW/kg (Calculated) : 75.3 kg Heparin Dosing Weight: 99.5 kg  Vital Signs: BP: 121/88 (05/30 0200) Pulse Rate: 70 (05/30 0200)  Labs: Recent Labs    02/03/21 1939 02/04/21 0305 02/04/21 0506 02/04/21 1355 02/04/21 1832 02/04/21 1832 02/04/21 2148 02/05/21 0418 02/05/21 1244 02/05/21 1928 02/06/21 0129 02/06/21 0816  HGB  --  14.0  --   --   --   --   --  14.2  --   --  13.4  --   HCT  --  40.9  --   --   --   --   --  41.6  --   --  39.2  --   PLT  --  151  --   --   --   --   --  184  --   --  163  --   APTT 33  --   --   --   --   --   --   --   --   --   --   --   LABPROT 14.6  --   --   --   --   --   --   --   --   --   --   --   INR 1.1  --   --   --   --   --   --   --   --   --   --   --   HEPARINUNFRC  --   --   --   --  <0.10*   < >  --  <0.10*   < > 0.29* 0.35 0.59  CREATININE  --  1.38*  --   --   --   --   --  0.85  --   --  0.84  --   TROPONINIHS 14 216*   < > 3,228* 2,610*  --  2,373*  --   --   --   --   --    < > = values in this interval not displayed.    Estimated Creatinine Clearance: 110 mL/min (by C-G formula based on SCr of 0.84 mg/dL).   Medical History: Past Medical History:  Diagnosis Date  . BMI 34.0-34.9,adult   . Headache   . HTN (hypertension)   . Hyperlipidemia     Assessment: 65 yo male with a past medical history of vascular disease, hypertension, hyperlipidemia, and tobacco abuse presents with chest pain. PTA the patient is not on anticoagulation. Pharmacy is consulted to dose heparin. Cards planning for left heart cath.  Heparin level is therapeutic at 0.35 on 2200 units/hr. Hgb/plts stable. No s/sx of bleeding or infusion issues reported.  Goal of Therapy:  Heparin level 0.3-0.7 units/ml Monitor platelets by anticoagulation protocol:  Yes   Plan:  -Continue heparin IV 2300 units/hr -Confirmatory heparin level now (morning heparin level ~6 hours ago) -Monitor a daily heparin level and CBC -Monitor for signs and symptoms of bleeding  Fara Olden, PharmD PGY-1 Pharmacy Resident 02/06/2021  7:41 AM   ADDENDUM: Confirmatory heparin level therapeutic at 0.59. Continue same plan as above.  Fara Olden, PharmD PGY-1 Pharmacy Resident 02/06/2021 10:45 AM Please see AMION for all pharmacy numbers

## 2021-02-06 NOTE — Progress Notes (Signed)
Progress Note  Patient Name: [name] Date of Encounter: 02/06/2021  [facility] Cardiologist: Dr Stanford Breed  Subjective   CP earlier increased with cough now resolved; dyspnea continues to improve.  Inpatient Medications    Scheduled Meds: . aspirin  81 mg Oral Daily  . famotidine  20 mg Oral Daily  . metoprolol tartrate  12.5 mg Oral BID  . nicotine  21 mg Transdermal Daily  . rosuvastatin  40 mg Oral Daily  . sodium chloride flush  3 mL Intravenous Q12H   Continuous Infusions: . cefTRIAXone (ROCEPHIN)  IV Stopped (02/05/21 1900)  . doxycycline (VIBRAMYCIN) IV Stopped (02/06/21 0125)  . heparin 2,300 Units/hr (02/06/21 0216)   PRN Meds: acetaminophen **OR** acetaminophen, alum & mag hydroxide-simeth, calcium carbonate, docusate sodium, guaiFENesin-dextromethorphan, nitroGLYCERIN, ondansetron, polyethylene glycol   Vital Signs    Vitals:   02/05/21 0823 02/05/21 2001 02/05/21 2201 02/06/21 0200  BP: 117/78 121/80 136/79 121/88  Pulse: 79 75 74 70  Resp:  18  20  Temp:  98.3 F (36.8 C)    TempSrc:  Oral    SpO2:  99%  96%  Weight:      Height:        Intake/Output Summary (Last 24 hours) at 02/06/2021 0809 Last data filed at 02/06/2021 0700 Gross per 24 hour  Intake 1664.3 ml  Output --  Net 1664.3 ml   Last 3 Weights 02/04/2021 02/03/2021 11/18/2020  Weight (lbs) 246 lb 12.8 oz 257 lb 0.9 oz 257 lb  Weight (kg) 111.948 kg 116.6 kg 116.574 kg      Telemetry    Sinus- Personally Reviewed  Physical Exam   GEN: WD WN No acute distress.   Neck: supple Cardiac: RRR, 2/6 systolic murmur; no DM Respiratory: CTA GI: Soft, NT/ND MS: No edema Neuro:  Grossly intact Psych: Normal affect   Labs    High Sensitivity Troponin:   Recent Labs  Lab 02/04/21 0506 02/04/21 0954 02/04/21 1355 02/04/21 1832 02/04/21 2148  TROPONINIHS 634* 2,883* 3,228* 2,610* 2,373*      Chemistry Recent Labs  Lab 02/03/21 1654 02/04/21 0305 02/05/21 0418  02/06/21 0129  NA 134* 133* 136 136  K 3.8 3.2* 4.2 3.9  CL 104 101 107 104  CO2 23 18* 22 26  GLUCOSE 121* 245* 109* 83  BUN 11 13 20 20   CREATININE 1.01 1.38* 0.85 0.84  CALCIUM 10.3 9.9 10.1 9.5  PROT 7.0 6.3*  --   --   ALBUMIN 3.8 3.2*  --   --   AST 21 42*  --   --   ALT 26 29  --   --   ALKPHOS 83 78  --   --   BILITOT 1.6* 0.9  --   --   GFRNONAA >60 56* >60 >60  ANIONGAP 7 14 7 6      Hematology Recent Labs  Lab 02/04/21 0305 02/05/21 0418 02/06/21 0129  WBC 11.2* 16.0* 12.0*  RBC 4.04* 4.19* 3.88*  HGB 14.0 14.2 13.4  HCT 40.9 41.6 39.2  MCV 101.2* 99.3 101.0*  MCH 34.7* 33.9 34.5*  MCHC 34.2 34.1 34.2  RDW 12.9 12.9 13.0  PLT 151 184 163    BNP Recent Labs  Lab 02/03/21 1654  BNP 103.0*     DDimer  Recent Labs  Lab 02/03/21 1939  DDIMER 1.13*     Radiology    CT ANGIO CHEST PE W OR WO CONTRAST  Result Date: 02/04/2021 CLINICAL DATA:  Chest pain, shortness of breath, concern for PE EXAM: CT ANGIOGRAPHY CHEST WITH CONTRAST TECHNIQUE: Multidetector CT imaging of the chest was performed using the standard protocol during bolus administration of intravenous contrast. Multiplanar CT image reconstructions and MIPs were obtained to evaluate the vascular anatomy. CONTRAST:  57mL OMNIPAQUE IOHEXOL 350 MG/ML SOLN COMPARISON:  None. FINDINGS: Cardiovascular: Examination for pulmonary embolism is limited by breath motion artifact. Within this limitation, no evidence of pulmonary embolism through the proximal segmental pulmonary arterial level. Three-vessel coronary artery calcification. No pericardial effusion. Severe, mixed aortic atherosclerosis. Mediastinum/Nodes: No enlarged mediastinal, hilar, or axillary lymph nodes. Thyroid gland, trachea, and esophagus demonstrate no significant findings. Lungs/Pleura: There is scattered heterogeneous and ground-glass airspace opacity throughout, for example in the dependent right lower lobe (series 9, image 61) and in the  posterior left upper lobe (series 9, image 40). Evaluation of airspace disease is likewise somewhat limited by breath motion artifact. No pleural effusion or pneumothorax. Upper Abdomen: No acute abnormality. Musculoskeletal: No chest wall abnormality. No acute or significant osseous findings. Review of the MIP images confirms the above findings. IMPRESSION: 1. Examination for pulmonary embolism is limited by breath motion artifact. Within this limitation, no evidence of pulmonary embolism through the proximal segmental pulmonary arterial level. 2. Scattered heterogeneous and ground-glass airspace opacity throughout, for example in the dependent right lower lobe and in the posterior left upper lobe. Evaluation of airspace disease is likewise somewhat limited by breath motion artifact. Findings are consistent with multifocal infection or aspiration. 3. Coronary artery disease. Aortic Atherosclerosis (ICD10-I70.0). Electronically Signed   By: Eddie Candle M.D.   On: 02/04/2021 17:18   DG CHEST PORT 1 VIEW  Result Date: 02/06/2021 CLINICAL DATA:  Cough EXAM: PORTABLE CHEST 1 VIEW COMPARISON:  02/04/2021 FINDINGS: Single frontal view of the chest demonstrates an unremarkable cardiac silhouette. Scattered areas of ground-glass airspace disease are seen within the mid and lower lung zones, similar to previous CT exam. No effusion or pneumothorax. No acute bony abnormalities. IMPRESSION: 1. Stable scattered bibasilar ground-glass airspace disease, which may reflect multifocal infection or aspiration. Electronically Signed   By: Randa Ngo M.D.   On: 02/06/2021 03:04   ECHOCARDIOGRAM COMPLETE  Result Date: 02/04/2021    ECHOCARDIOGRAM REPORT   Patient Name:   [name] Date of Exam: 02/04/2021 Medical Rec #:  [id number]        Height:       71.0 in Accession #:    [id number]       Weight:       246.8 lb Date of Birth:  December 07, 1955        BSA:          2.306 m Patient Age:    65 years         BP:            107/79 mmHg Patient Gender: M                HR:           87 bpm. Exam Location:  Inpatient Procedure: 2D Echo, Cardiac Doppler and Color Doppler Indications:    R07.9* Chest pain, unspecified  History:        Patient has prior history of Echocardiogram examinations, most                 recent 04/29/2020. Signs/Symptoms:Shortness of Breath. Coughing.  Sonographer:    Merrie Roof RDCS Referring Phys: [place]  1.  Left ventricular ejection fraction, by estimation, is 60 to 65%. The left ventricle has normal function. The left ventricle has no regional wall motion abnormalities. Left ventricular diastolic parameters are consistent with Grade I diastolic dysfunction (impaired relaxation). Elevated left atrial pressure.  2. Right ventricular systolic function is normal. The right ventricular size is normal.  3. The mitral valve is normal in structure. No evidence of mitral valve regurgitation. No evidence of mitral stenosis.  4. The aortic valve has an indeterminant number of cusps. Aortic valve regurgitation is not visualized. Moderate aortic valve stenosis.  5. The inferior vena cava is normal in size with greater than 50% respiratory variability, suggesting right atrial pressure of 3 mmHg. FINDINGS  Left Ventricle: Left ventricular ejection fraction, by estimation, is 60 to 65%. The left ventricle has normal function. The left ventricle has no regional wall motion abnormalities. The left ventricular internal cavity size was normal in size. There is  no left ventricular hypertrophy. Left ventricular diastolic parameters are consistent with Grade I diastolic dysfunction (impaired relaxation). Elevated left atrial pressure. Right Ventricle: The right ventricular size is normal. Right ventricular systolic function is normal. Left Atrium: Left atrial size was normal in size. Right Atrium: Right atrial size was normal in size. Pericardium: There is no evidence of pericardial effusion. Mitral  Valve: The mitral valve is normal in structure. Mild mitral annular calcification. No evidence of mitral valve regurgitation. No evidence of mitral valve stenosis. Tricuspid Valve: The tricuspid valve is normal in structure. Tricuspid valve regurgitation is trivial. No evidence of tricuspid stenosis. Aortic Valve: The aortic valve has an indeterminant number of cusps. Aortic valve regurgitation is not visualized. Moderate aortic stenosis is present. Aortic valve mean gradient measures 25.8 mmHg. Aortic valve peak gradient measures 45.2 mmHg. Aortic valve area, by VTI measures 1.28 cm. Pulmonic Valve: The pulmonic valve was not well visualized. Pulmonic valve regurgitation is not visualized. No evidence of pulmonic stenosis. Aorta: The aortic root is normal in size and structure. Venous: The inferior vena cava is normal in size with greater than 50% respiratory variability, suggesting right atrial pressure of 3 mmHg.   LEFT VENTRICLE PLAX 2D LVIDd:         5.30 cm  Diastology LVIDs:         4.00 cm  LV e' medial:    9.46 cm/s LV PW:         1.00 cm  LV E/e' medial:  11.3 LV IVS:        1.20 cm  LV e' lateral:   6.31 cm/s LVOT diam:     2.10 cm  LV E/e' lateral: 17.0 LV SV:         84 LV SV Index:   37 LVOT Area:     3.46 cm  RIGHT VENTRICLE RV Basal diam:  3.90 cm LEFT ATRIUM           Index       RIGHT ATRIUM           Index LA diam:      3.00 cm 1.30 cm/m  RA Area:     24.60 cm LA Vol (A2C): 83.9 ml 36.39 ml/m RA Volume:   85.30 ml  37.00 ml/m LA Vol (A4C): 35.6 ml 15.44 ml/m  AORTIC VALVE AV Area (Vmax):    1.17 cm AV Area (Vmean):   1.16 cm AV Area (VTI):     1.28 cm AV Vmax:  336.20 cm/s AV Vmean:          237.200 cm/s AV VTI:            0.658 m AV Peak Grad:      45.2 mmHg AV Mean Grad:      25.8 mmHg LVOT Vmax:         114.00 cm/s LVOT Vmean:        79.550 cm/s LVOT VTI:          0.243 m LVOT/AV VTI ratio: 0.37  AORTA Ao Root diam: 3.70 cm MITRAL VALVE MV Area (PHT): 2.96 cm     SHUNTS MV  Decel Time: 256 msec     Systemic VTI:  0.24 m MV E velocity: 107.00 cm/s  Systemic Diam: 2.10 cm MV A velocity: 116.00 cm/s MV E/A ratio:  0.92 [name] MD Electronically signed by [name] MD Signature Date/Time: 02/04/2021/12:00:26 PM    Final     Patient Profile     65 year old male with past medical history of hypertension, hyperlipidemia, peripheral vascular disease, tobacco abuse, COPD for evaluation of chest pain and elevated troponin. Admitted with URI and chest tightness. CTA shows no pulmonary embolus; multifocal infection vs aspiration and 3 vessel coronary calcification. Troponin is 12, 14, 216, 634, 2883 and 3228.  BNP 103.  Echocardiogram shows normal LV function, grade 1 diastolic dysfunction, moderate aortic stenosis with mean gradient 26 mmHg.  Assessment & Plan    1 non-ST elevation myocardial infarction-as outlined earlier, symptoms on presentation seem more consistent with upper respiratory infection. However troponin increased to greater than 3000.  His chest CT also shows three-vessel coronary calcification.  Therefore I feel we need to proceed with cardiac catheterization for definitive evaluation.  The risks and benefits including myocardial infarction, CVA and death previously discussed and he agrees to proceed.  Continue aspirin, metoprolol, heparin and statin. Note LV function is normal on echocardiogram.  2 upper respiratory infection/pneumonia-continue pulmonary toilet and antibiotics per primary care.  3 tobacco abuse-patient again counseled on discontinuing.  4 aortic stenosis-patient will need follow-up echocardiograms in the future.  Moderate on echocardiogram this admission.  5 hypertension-patient's blood pressure appears to be controlled.  We will continue present medications following discharge.  For questions or updates, please contact [place] Please consult [email or web address] for contact info under        Signed, [name], MD   02/06/2021, 8:09 AM

## 2021-02-06 NOTE — Plan of Care (Signed)
  Problem: Clinical Measurements: Goal: Will remain free from infection Outcome: Progressing Goal: Diagnostic test results will improve Outcome: Progressing Goal: Respiratory complications will improve Outcome: Progressing Goal: Cardiovascular complication will be avoided Outcome: Progressing   

## 2021-02-06 NOTE — Progress Notes (Signed)
Pt called to report 6/10 chest pain, dull in nature that shoots through from anterior to posterior slightly to left of the sternum. Full set of vital signs taken, EKG performed, and nitroglycerin x1 given. Pt stated that coughing relived pain to 3-4/10 and the nitro reduced to 1/10. Breathing continues to be labored and and pt cannot lie down <45 degrees since it worsens his ability to breathe.  Notified Dr. Hal Hope, he assessed pt at bedside.   Judson Roch Wael Maestas, RN 05.30.22 0230

## 2021-02-07 ENCOUNTER — Encounter (HOSPITAL_COMMUNITY)
Admission: EM | Disposition: A | Discharge status: Home / Self Care | Payer: Self-pay | Source: Home / Self Care | Attending: Hospitalist

## 2021-02-07 DIAGNOSIS — I10 Essential (primary) hypertension: Secondary | ICD-10-CM

## 2021-02-07 DIAGNOSIS — R0600 Dyspnea, unspecified: Secondary | ICD-10-CM

## 2021-02-07 DIAGNOSIS — I350 Nonrheumatic aortic (valve) stenosis: Secondary | ICD-10-CM | POA: Diagnosis not present

## 2021-02-07 DIAGNOSIS — J189 Pneumonia, unspecified organism: Secondary | ICD-10-CM

## 2021-02-07 DIAGNOSIS — R072 Precordial pain: Secondary | ICD-10-CM | POA: Diagnosis not present

## 2021-02-07 DIAGNOSIS — I214 Non-ST elevation (NSTEMI) myocardial infarction: Secondary | ICD-10-CM | POA: Diagnosis not present

## 2021-02-07 DIAGNOSIS — I2510 Atherosclerotic heart disease of native coronary artery without angina pectoris: Secondary | ICD-10-CM

## 2021-02-07 HISTORY — PX: LEFT HEART CATH AND CORONARY ANGIOGRAPHY: CATH118249

## 2021-02-07 HISTORY — DX: Pneumonia, unspecified organism: J18.9

## 2021-02-07 HISTORY — DX: Dyspnea, unspecified: R06.00

## 2021-02-07 LAB — CBC
HCT: 38.3 % — ABNORMAL LOW (ref 39.0–52.0)
HCT: 36.5 % — ABNORMAL LOW (ref 39.0–52.0)
Hemoglobin: 13.1 g/dL (ref 13.0–17.0)
Hemoglobin: 12.4 g/dL — ABNORMAL LOW (ref 13.0–17.0)
MCH: 34 pg (ref 26.0–34.0)
MCH: 34.1 pg — ABNORMAL HIGH (ref 26.0–34.0)
MCHC: 34.2 g/dL (ref 30.0–36.0)
MCHC: 34 g/dL (ref 30.0–36.0)
MCV: 99.5 fL (ref 80.0–100.0)
MCV: 100.3 fL — ABNORMAL HIGH (ref 80.0–100.0)
Platelets: 167 10*3/uL (ref 150–400)
Platelets: 163 10*3/uL (ref 150–400)
RBC: 3.85 MIL/uL — ABNORMAL LOW (ref 4.22–5.81)
RBC: 3.64 MIL/uL — ABNORMAL LOW (ref 4.22–5.81)
RDW: 13 % (ref 11.5–15.5)
RDW: 12.9 % (ref 11.5–15.5)
WBC: 7.4 10*3/uL (ref 4.0–10.5)
WBC: 6.7 10*3/uL (ref 4.0–10.5)
nRBC: 0 % (ref 0.0–0.2)
nRBC: 0 % (ref 0.0–0.2)

## 2021-02-07 LAB — HEPARIN LEVEL (UNFRACTIONATED): Heparin Unfractionated: 0.46 IU/mL (ref 0.30–0.70)

## 2021-02-07 LAB — CREATININE, SERUM
Creatinine, Ser: 0.74 mg/dL (ref 0.61–1.24)
GFR, Estimated: >60 mL/min (ref >60)

## 2021-02-07 LAB — BASIC METABOLIC PANEL
Anion gap: 9 (ref 5–15)
BUN: 10 mg/dL (ref 8–23)
CO2: 24 mmol/L (ref 22–32)
Calcium: 9.6 mg/dL (ref 8.9–10.3)
Chloride: 105 mmol/L (ref 98–111)
Creatinine, Ser: 0.83 mg/dL (ref 0.61–1.24)
GFR, Estimated: >60 mL/min (ref >60)
Glucose, Bld: 96 mg/dL (ref 70–99)
Potassium: 3.8 mmol/L (ref 3.5–5.1)
Sodium: 138 mmol/L (ref 135–145)

## 2021-02-07 LAB — MAGNESIUM: Magnesium: 1.9 mg/dL (ref 1.7–2.4)

## 2021-02-07 SURGERY — LEFT HEART CATH AND CORONARY ANGIOGRAPHY
Anesthesia: LOCAL

## 2021-02-07 MED ORDER — SODIUM CHLORIDE 0.9 % WEIGHT BASED INFUSION
1.0000 mL/kg/h | INTRAVENOUS | Status: AC
  Administered 2021-02-07: 1 mL/kg/h via INTRAVENOUS

## 2021-02-07 MED ORDER — MIDAZOLAM HCL 2 MG/2ML IJ SOLN
INTRAMUSCULAR | Status: AC
  Filled 2021-02-07: qty 2

## 2021-02-07 MED ORDER — IOHEXOL 350 MG/ML SOLN
INTRAVENOUS | Status: DC | PRN
  Administered 2021-02-07: 85 mL

## 2021-02-07 MED ORDER — FENTANYL CITRATE (PF) 100 MCG/2ML IJ SOLN
INTRAMUSCULAR | Status: AC
  Filled 2021-02-07: qty 2

## 2021-02-07 MED ORDER — SODIUM CHLORIDE 0.9 % IV SOLN: 250.0000 mL | INTRAVENOUS | Status: DC | PRN

## 2021-02-07 MED ORDER — SODIUM CHLORIDE 0.9% FLUSH: 3.0000 mL | INTRAVENOUS | Status: DC | PRN

## 2021-02-07 MED ORDER — LIDOCAINE HCL (PF) 1 % IJ SOLN
INTRAMUSCULAR | Status: DC | PRN
  Administered 2021-02-07: 2 mL

## 2021-02-07 MED ORDER — SODIUM CHLORIDE 0.9% FLUSH: 3.0000 mL | Freq: Two times a day (BID) | INTRAVENOUS | Status: DC

## 2021-02-07 MED ORDER — LIDOCAINE HCL (PF) 1 % IJ SOLN
INTRAMUSCULAR | Status: AC
  Filled 2021-02-07: qty 30

## 2021-02-07 MED ORDER — ENOXAPARIN SODIUM 40 MG/0.4ML IJ SOSY
40.0000 mg | PREFILLED_SYRINGE | INTRAMUSCULAR | Status: DC
  Filled 2021-02-07: qty 0.4

## 2021-02-07 MED ORDER — HEPARIN (PORCINE) IN NACL 1000-0.9 UT/500ML-% IV SOLN
INTRAVENOUS | Status: DC | PRN
  Administered 2021-02-07 (×2): 500 mL

## 2021-02-07 MED ORDER — MIDAZOLAM HCL 2 MG/2ML IJ SOLN
INTRAMUSCULAR | Status: DC | PRN
  Administered 2021-02-07: 2 mg via INTRAVENOUS

## 2021-02-07 MED ORDER — HEPARIN SODIUM (PORCINE) 1000 UNIT/ML IJ SOLN
INTRAMUSCULAR | Status: AC
  Filled 2021-02-07: qty 1

## 2021-02-07 MED ORDER — HEPARIN SODIUM (PORCINE) 1000 UNIT/ML IJ SOLN
INTRAMUSCULAR | Status: DC | PRN
  Administered 2021-02-07: 5500 [IU] via INTRAVENOUS

## 2021-02-07 MED ORDER — HEPARIN (PORCINE) IN NACL 2-0.9 UNITS/ML
INTRAMUSCULAR | Status: DC | PRN
  Administered 2021-02-07: 10 mL via INTRA_ARTERIAL

## 2021-02-07 MED ORDER — HEPARIN (PORCINE) IN NACL 1000-0.9 UT/500ML-% IV SOLN
INTRAVENOUS | Status: AC
  Filled 2021-02-07: qty 500

## 2021-02-07 MED ORDER — ONDANSETRON HCL 4 MG/2ML IJ SOLN: 4.0000 mg | Freq: Four times a day (QID) | INTRAMUSCULAR | Status: DC | PRN

## 2021-02-07 MED ORDER — VERAPAMIL HCL 2.5 MG/ML IV SOLN
INTRAVENOUS | Status: AC
  Filled 2021-02-07: qty 2

## 2021-02-07 MED ORDER — FENTANYL CITRATE (PF) 100 MCG/2ML IJ SOLN
INTRAMUSCULAR | Status: DC | PRN
  Administered 2021-02-07: 50 ug via INTRAVENOUS

## 2021-02-07 SURGICAL SUPPLY — 14 items
CATH 5FR JL3.5 JR4 ANG PIG MP (CATHETERS) ×2 IMPLANT
CATH INFINITI 5 FR 3DRC (CATHETERS) ×2 IMPLANT
CATH INFINITI 5FR AL1 (CATHETERS) ×2 IMPLANT
CATH LAUNCHER 5F RADR (CATHETERS) ×1 IMPLANT
CATHETER LAUNCHER 5F RADR (CATHETERS) ×2
DEVICE RAD COMP TR BAND LRG (VASCULAR PRODUCTS) ×2 IMPLANT
GLIDESHEATH SLEND SS 6F .021 (SHEATH) ×2 IMPLANT
GUIDEWIRE INQWIRE 1.5J.035X260 (WIRE) ×1 IMPLANT
INQWIRE 1.5J .035X260CM (WIRE) ×2
KIT HEART LEFT (KITS) ×2 IMPLANT
PACK CARDIAC CATHETERIZATION (CUSTOM PROCEDURE TRAY) ×2 IMPLANT
TRANSDUCER W/STOPCOCK (MISCELLANEOUS) ×2 IMPLANT
TUBING CIL FLEX 10 FLL-RA (TUBING) ×2 IMPLANT
WIRE EMERALD ST .035X150CM (WIRE) ×2 IMPLANT

## 2021-02-07 NOTE — Interval H&P Note (Signed)
History and Physical Interval Note:  02/07/2021 3:50 PM  [name]  has presented today for surgery, with the diagnosis of NSTEMI.  The various methods of treatment have been discussed with the patient and family. After consideration of risks, benefits and other options for treatment, the patient has consented to  Procedure(s): LEFT HEART CATH AND CORONARY ANGIOGRAPHY (N/A) as a surgical intervention.  The patient's history has been reviewed, patient examined, no change in status, stable for surgery.  I have reviewed the patient's chart and labs.  Questions were answered to the patient's satisfaction.   Cath Lab Visit (complete for each Cath Lab visit)  Clinical Evaluation Leading to the Procedure:   ACS: Yes.    Non-ACS:    Anginal Classification: CCS II  Anti-ischemic medical therapy: Minimal Therapy (1 class of medications)  Non-Invasive Test Results: No non-invasive testing performed  Prior CABG: No previous CABG        [name] [facility] 02/07/2021 3:51 PM

## 2021-02-07 NOTE — Progress Notes (Signed)
ANTICOAGULATION CONSULT NOTE  Pharmacy Consult for Heparin Indication: chest pain/ACS  No Known Allergies  Patient Measurements: Height: 5\' 11"  (180.3 cm) Weight: 112.4 kg (247 lb 14.4 oz) IBW/kg (Calculated) : 75.3 kg Heparin Dosing Weight: 99.5 kg  Vital Signs: Temp: 98 F (36.7 C) (05/31 1352) Temp Source: Oral (05/31 1352) BP: 130/74 (05/31 1352) Pulse Rate: 61 (05/31 1352)  Labs: Recent Labs    02/04/21 1832 02/04/21 1832 02/04/21 2148 02/05/21 0418 02/05/21 1244 02/06/21 0129 02/06/21 0816 02/07/21 0316  HGB  --    < >  --  14.2  --  13.4  --  13.1  HCT  --   --   --  41.6  --  39.2  --  38.3*  PLT  --   --   --  184  --  163  --  167  HEPARINUNFRC <0.10*  --   --  <0.10*   < > 0.35 0.59 0.46  CREATININE  --   --   --  0.85  --  0.84  --  0.83  TROPONINIHS 2,610*  --  2,373*  --   --   --   --   --    < > = values in this interval not displayed.    Estimated Creatinine Clearance: 111.6 mL/min (by C-G formula based on SCr of 0.83 mg/dL).   Medical History: Past Medical History:  Diagnosis Date  . BMI 34.0-34.9,adult   . Headache   . HTN (hypertension)   . Hyperlipidemia     Assessment: 65 yo male with a past medical history of vascular disease, hypertension, hyperlipidemia, and tobacco abuse presents with chest pain. PTA the patient is not on anticoagulation. Pharmacy is consulted to dose heparin. Cards planning for left heart cath.  Heparin level is therapeutic at 0.46 on heparin drip rate 2300 units/hr. Hgb/plts stable. No s/sx of bleeding or infusion issues reported.  Goal of Therapy:  Heparin level 0.3-0.7 units/ml Monitor platelets by anticoagulation protocol: Yes   Plan:  -Continue heparin IV 2300 units/hr -Monitor a daily heparin level and CBC -Monitor for signs and symptoms of bleeding   Bonnita Nasuti Pharm.D. CPP, BCPS Clinical Pharmacist [phone] 02/07/2021 2:42 PM

## 2021-02-07 NOTE — Progress Notes (Signed)
Progress Note  Patient Name: [name] Date of Encounter: 02/07/2021  [facility] Cardiologist: Dr Stanford Breed  Subjective   No CP or dyspnea this AM  Inpatient Medications    Scheduled Meds: . aspirin  81 mg Oral Daily  . famotidine  20 mg Oral Daily  . metoprolol tartrate  12.5 mg Oral BID  . nicotine  21 mg Transdermal Daily  . rosuvastatin  40 mg Oral Daily  . sodium chloride flush  3 mL Intravenous Q12H   Continuous Infusions: . sodium chloride    . sodium chloride 1 mL/kg/hr (02/07/21 0350)  . cefTRIAXone (ROCEPHIN)  IV Stopped (02/06/21 1900)  . doxycycline (VIBRAMYCIN) IV Stopped (02/07/21 0009)  . heparin 2,300 Units/hr (02/07/21 0154)   PRN Meds: sodium chloride, acetaminophen **OR** acetaminophen, alum & mag hydroxide-simeth, benzonatate, calcium carbonate, docusate sodium, guaiFENesin-dextromethorphan, nitroGLYCERIN, ondansetron, polyethylene glycol, sodium chloride flush   Vital Signs    Vitals:   02/06/21 0200 02/06/21 1140 02/06/21 2053 02/07/21 0329  BP: 121/88 119/88 120/79 138/86  Pulse: 70 60 67   Resp: 20 18 18    Temp:  98.2 F (36.8 C) 98.3 F (36.8 C) 97.7 F (36.5 C)  TempSrc:  Oral Oral Oral  SpO2: 96% 100% 97% 98%  Weight:    112.4 kg  Height:        Intake/Output Summary (Last 24 hours) at 02/07/2021 0754 Last data filed at 02/07/2021 0600 Gross per 24 hour  Intake 2107.53 ml  Output --  Net 2107.53 ml   Last 3 Weights 02/07/2021 02/04/2021 02/03/2021  Weight (lbs) 247 lb 14.4 oz 246 lb 12.8 oz 257 lb 0.9 oz  Weight (kg) 112.447 kg 111.948 kg 116.6 kg      Telemetry    Sinus- Personally Reviewed  Physical Exam   GEN: WD WN NAD Neck: supple, no JVD Cardiac: RRR, 2/6 systolic murmur Respiratory: CTA; no wheeze GI: Soft, NT/ND, no masses MS: No edema Neuro:  no focal findings Psych: Normal affect   Labs    High Sensitivity Troponin:   Recent Labs  Lab 02/04/21 0506 02/04/21 0954 02/04/21 1355 02/04/21 1832  02/04/21 2148  TROPONINIHS 634* 2,883* 3,228* 2,610* 2,373*      Chemistry Recent Labs  Lab 02/03/21 1654 02/04/21 0305 02/05/21 0418 02/06/21 0129 02/07/21 0316  NA 134* 133* 136 136 138  K 3.8 3.2* 4.2 3.9 3.8  CL 104 101 107 104 105  CO2 23 18* 22 26 24   GLUCOSE 121* 245* 109* 83 96  BUN 11 13 20 20 10   CREATININE 1.01 1.38* 0.85 0.84 0.83  CALCIUM 10.3 9.9 10.1 9.5 9.6  PROT 7.0 6.3*  --   --   --   ALBUMIN 3.8 3.2*  --   --   --   AST 21 42*  --   --   --   ALT 26 29  --   --   --   ALKPHOS 83 78  --   --   --   BILITOT 1.6* 0.9  --   --   --   GFRNONAA >60 56* >60 >60 >60  ANIONGAP 7 14 7 6 9      Hematology Recent Labs  Lab 02/05/21 0418 02/06/21 0129 02/07/21 0316  WBC 16.0* 12.0* 7.4  RBC 4.19* 3.88* 3.85*  HGB 14.2 13.4 13.1  HCT 41.6 39.2 38.3*  MCV 99.3 101.0* 99.5  MCH 33.9 34.5* 34.0  MCHC 34.1 34.2 34.2  RDW 12.9 13.0 13.0  PLT 184 163 167    BNP Recent Labs  Lab 02/03/21 1654  BNP 103.0*     DDimer  Recent Labs  Lab 02/03/21 1939  DDIMER 1.13*     Radiology    DG CHEST PORT 1 VIEW  Result Date: 02/06/2021 CLINICAL DATA:  Cough EXAM: PORTABLE CHEST 1 VIEW COMPARISON:  02/04/2021 FINDINGS: Single frontal view of the chest demonstrates an unremarkable cardiac silhouette. Scattered areas of ground-glass airspace disease are seen within the mid and lower lung zones, similar to previous CT exam. No effusion or pneumothorax. No acute bony abnormalities. IMPRESSION: 1. Stable scattered bibasilar ground-glass airspace disease, which may reflect multifocal infection or aspiration. Electronically Signed   By: Randa Ngo M.D.   On: 02/06/2021 03:04    Patient Profile     65 year old male with past medical history of hypertension, hyperlipidemia, peripheral vascular disease, tobacco abuse, COPD for evaluation of chest pain and elevated troponin. Admitted with URI and chest tightness. CTA shows no pulmonary embolus; multifocal infection vs  aspiration and 3 vessel coronary calcification. Troponin is 12, 14, 216, 634, 2883 and 3228.  BNP 103.  Echocardiogram shows normal LV function, grade 1 diastolic dysfunction, moderate aortic stenosis with mean gradient 26 mmHg.  Assessment & Plan    1 non-ST elevation myocardial infarction-as outlined earlier, symptoms on presentation seem more consistent with upper respiratory infection. However troponin increased to greater than 3000.  His chest CT also shows three-vessel coronary calcification.  Therefore I feel we need to proceed with cardiac catheterization for definitive evaluation.  The risks and benefits including myocardial infarction, CVA and death previously discussed and he agrees to proceed. LV function is normal on echocardiogram.  Continue aspirin, heparin, statin and metoprolol.  2 upper respiratory infection/pneumonia-management per primary care.  3 tobacco abuse-patient again counseled on discontinuing.  4 aortic stenosis-moderate on recent echocardiogram.  He will need follow-up studies in the future.  5 hypertension-patient's blood pressure appears to be controlled.  Continue present medical regimen.  For questions or updates, please contact [place] Please consult [email or web address] for contact info under        Signed, [name], MD  02/07/2021, 7:54 AM

## 2021-02-07 NOTE — H&P (View-Only) (Signed)
Progress Note  Patient Name: [name] Date of Encounter: 02/07/2021  [facility] Cardiologist: Dr Stanford Breed  Subjective   No CP or dyspnea this AM  Inpatient Medications    Scheduled Meds: . aspirin  81 mg Oral Daily  . famotidine  20 mg Oral Daily  . metoprolol tartrate  12.5 mg Oral BID  . nicotine  21 mg Transdermal Daily  . rosuvastatin  40 mg Oral Daily  . sodium chloride flush  3 mL Intravenous Q12H   Continuous Infusions: . sodium chloride    . sodium chloride 1 mL/kg/hr (02/07/21 0350)  . cefTRIAXone (ROCEPHIN)  IV Stopped (02/06/21 1900)  . doxycycline (VIBRAMYCIN) IV Stopped (02/07/21 0009)  . heparin 2,300 Units/hr (02/07/21 0154)   PRN Meds: sodium chloride, acetaminophen **OR** acetaminophen, alum & mag hydroxide-simeth, benzonatate, calcium carbonate, docusate sodium, guaiFENesin-dextromethorphan, nitroGLYCERIN, ondansetron, polyethylene glycol, sodium chloride flush   Vital Signs    Vitals:   02/06/21 0200 02/06/21 1140 02/06/21 2053 02/07/21 0329  BP: 121/88 119/88 120/79 138/86  Pulse: 70 60 67   Resp: 20 18 18    Temp:  98.2 F (36.8 C) 98.3 F (36.8 C) 97.7 F (36.5 C)  TempSrc:  Oral Oral Oral  SpO2: 96% 100% 97% 98%  Weight:    112.4 kg  Height:        Intake/Output Summary (Last 24 hours) at 02/07/2021 0754 Last data filed at 02/07/2021 0600 Gross per 24 hour  Intake 2107.53 ml  Output --  Net 2107.53 ml   Last 3 Weights 02/07/2021 02/04/2021 02/03/2021  Weight (lbs) 247 lb 14.4 oz 246 lb 12.8 oz 257 lb 0.9 oz  Weight (kg) 112.447 kg 111.948 kg 116.6 kg      Telemetry    Sinus- Personally Reviewed  Physical Exam   GEN: WD WN NAD Neck: supple, no JVD Cardiac: RRR, 2/6 systolic murmur Respiratory: CTA; no wheeze GI: Soft, NT/ND, no masses MS: No edema Neuro:  no focal findings Psych: Normal affect   Labs    High Sensitivity Troponin:   Recent Labs  Lab 02/04/21 0506 02/04/21 0954 02/04/21 1355 02/04/21 1832  02/04/21 2148  TROPONINIHS 634* 2,883* 3,228* 2,610* 2,373*      Chemistry Recent Labs  Lab 02/03/21 1654 02/04/21 0305 02/05/21 0418 02/06/21 0129 02/07/21 0316  NA 134* 133* 136 136 138  K 3.8 3.2* 4.2 3.9 3.8  CL 104 101 107 104 105  CO2 23 18* 22 26 24   GLUCOSE 121* 245* 109* 83 96  BUN 11 13 20 20 10   CREATININE 1.01 1.38* 0.85 0.84 0.83  CALCIUM 10.3 9.9 10.1 9.5 9.6  PROT 7.0 6.3*  --   --   --   ALBUMIN 3.8 3.2*  --   --   --   AST 21 42*  --   --   --   ALT 26 29  --   --   --   ALKPHOS 83 78  --   --   --   BILITOT 1.6* 0.9  --   --   --   GFRNONAA >60 56* >60 >60 >60  ANIONGAP 7 14 7 6 9      Hematology Recent Labs  Lab 02/05/21 0418 02/06/21 0129 02/07/21 0316  WBC 16.0* 12.0* 7.4  RBC 4.19* 3.88* 3.85*  HGB 14.2 13.4 13.1  HCT 41.6 39.2 38.3*  MCV 99.3 101.0* 99.5  MCH 33.9 34.5* 34.0  MCHC 34.1 34.2 34.2  RDW 12.9 13.0 13.0  PLT 184 163 167    BNP Recent Labs  Lab 02/03/21 1654  BNP 103.0*     DDimer  Recent Labs  Lab 02/03/21 1939  DDIMER 1.13*     Radiology    DG CHEST PORT 1 VIEW  Result Date: 02/06/2021 CLINICAL DATA:  Cough EXAM: PORTABLE CHEST 1 VIEW COMPARISON:  02/04/2021 FINDINGS: Single frontal view of the chest demonstrates an unremarkable cardiac silhouette. Scattered areas of ground-glass airspace disease are seen within the mid and lower lung zones, similar to previous CT exam. No effusion or pneumothorax. No acute bony abnormalities. IMPRESSION: 1. Stable scattered bibasilar ground-glass airspace disease, which may reflect multifocal infection or aspiration. Electronically Signed   By: Randa Ngo M.D.   On: 02/06/2021 03:04    Patient Profile     65 year old male with past medical history of hypertension, hyperlipidemia, peripheral vascular disease, tobacco abuse, COPD for evaluation of chest pain and elevated troponin. Admitted with URI and chest tightness. CTA shows no pulmonary embolus; multifocal infection vs  aspiration and 3 vessel coronary calcification. Troponin is 12, 14, 216, 634, 2883 and 3228.  BNP 103.  Echocardiogram shows normal LV function, grade 1 diastolic dysfunction, moderate aortic stenosis with mean gradient 26 mmHg.  Assessment & Plan    1 non-ST elevation myocardial infarction-as outlined earlier, symptoms on presentation seem more consistent with upper respiratory infection. However troponin increased to greater than 3000.  His chest CT also shows three-vessel coronary calcification.  Therefore I feel we need to proceed with cardiac catheterization for definitive evaluation.  The risks and benefits including myocardial infarction, CVA and death previously discussed and he agrees to proceed. LV function is normal on echocardiogram.  Continue aspirin, heparin, statin and metoprolol.  2 upper respiratory infection/pneumonia-management per primary care.  3 tobacco abuse-patient again counseled on discontinuing.  4 aortic stenosis-moderate on recent echocardiogram.  He will need follow-up studies in the future.  5 hypertension-patient's blood pressure appears to be controlled.  Continue present medical regimen.  For questions or updates, please contact [place] Please consult [email or web address] for contact info under        Signed, [name], MD  02/07/2021, 7:54 AM

## 2021-02-07 NOTE — Progress Notes (Signed)
PROGRESS NOTE    [name]  NID:[id number] DOB: 1956-09-02 DOA: 02/03/2021 PCP: Orpah Melter, MD  [id number]   Assessment & Plan:   Principal Problem:   Chest pain Active Problems:   Acute bronchitis   Fever in adult   Diarrhea   Essential hypertension   Hyperlipidemia   HTN (hypertension)   BMI 34.0-34.9,adult   NSTEMI (non-ST elevated myocardial infarction) ([place])   [name] is a 65 y.o. male with history of hypertension, hyperlipidemia, vascular disease, tobacco abuse presents to the ER with complaints of having chest pain with nonproductive cough and diarrhea.  Patient symptoms have been present for the last 3 days.  Chest pain is retrosternal pressure-like increased on exertion.  Patient's cough has been nonproductive.  Denies any recent sick contacts travel.  And diarrhea has been watery.  At least 3 episodes today.  No recent travel or sick contacts.  Denies using any antibiotics.  Patient also had a tick found on the body.  Recently patient has been having right shoulder pain secondary rotator cuff and has been receiving pain medication for the last 1 month.   Chest pain, POA NSTEMI  -history of vascular disease with history of tobacco abuse hypertension hyperlipidemia  --troponin trending up to 3000's --CTA chest, neg for PE through the proximal segmental pulmonary arterial level. 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 --COVID neg, RVP neg.  procal elevated Plan: --cont ceftriaxone, day 3 --cont doxy  Diarrhea, resolved -has not been on any antibiotics recently.   Denies any recent travel or sick contacts.    Tick bite --tick-born illness titers ordered on admission.  Started on empiric doxy on admission --cont doxy for now as part of CAP treatment  Hypertension --Hold home BP meds since BP intermittently low --cont Lopressor 12.5 mg BID  Hyperlipidemia --cont statin  Hypokalemia --monitor and replete PRN  Current smoker --nicotine patch  aortic atherosclerosis --cont ASA and statin  Aortic stenosis, mod --followup Echo in the future   DVT prophylaxis: Lovenox SQ Code Status: Full code  Family Communication: wife updated at bedside today  Level of care: Telemetry Cardiac Dispo:   The patient is from: home Anticipated d/c is to: home Anticipated d/c date is: tomorrow likely  Patient currently is not medically ready to d/c due to: cardiology didn't clear pt for discharge   Subjective and Interval History:  Reported cough improved.  No more chest pain.  Complained of being thirsty due to being NPO.  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  Card recommended medical magenemtn.   Objective: Vitals:   02/07/21 1632 02/07/21 1637 02/07/21 1642 02/07/21 1702  BP: 129/75 127/77 (!) 144/86 132/84  Pulse: (!) 59 (!) 58 62 61  Resp: 18 18 14 18   Temp:    98.5 F (36.9 C)  TempSrc:    Oral  SpO2: 94% 94% 97% 93%  Weight:      Height:        Intake/Output Summary (Last 24 hours) at 02/07/2021 1927 Last data filed at 02/07/2021 1027 Gross per 24 hour  Intake 2453.79 ml  Output --  Net 2453.79  ml   Filed Weights   02/03/21 1714 02/04/21 0630 02/07/21 0329  Weight: 116.6 kg 111.9 kg 112.4 kg    Examination:   Constitutional: NAD, AAOx3, sitting up in chair HEENT: conjunctivae and lids normal, EOMI CV: No cyanosis.   RESP: normal respiratory effort, on RA, clear Extremities: No effusions, edema in BLE SKIN: warm, dry Neuro: II - XII grossly intact.   Psych:  depressed mood and affect.  Appropriate judgement and reason   Data Reviewed: I have personally reviewed following labs and imaging studies  CBC: Recent Labs  Lab 02/03/21 1654 02/04/21 0305 02/05/21 0418 02/06/21 0129 02/07/21 0316 02/07/21 1731  WBC 11.9* 11.2* 16.0* 12.0* 7.4 6.7  NEUTROABS 9.7* 10.2*  --   --   --   --   HGB 15.1 14.0 14.2 13.4 13.1 12.4*  HCT 43.2 40.9 41.6 39.2 38.3* 36.5*  MCV 98.6 101.2* 99.3 101.0* 99.5 100.3*  PLT 150 151 184 163 167 166   Basic Metabolic Panel: Recent Labs  Lab 02/03/21 1654 02/04/21 0305 02/05/21 0418 02/06/21 0129 02/07/21 0316 02/07/21 1731  NA 134* 133* 136 136 138  --   K 3.8 3.2* 4.2 3.9 3.8  --   CL 104 101 107 104 105  --   CO2 23 18* 22 26 24   --   GLUCOSE 121* 245* 109* 83 96  --   BUN 11 13 20 20 10   --   CREATININE 1.01 1.38* 0.85 0.84 0.83 0.74  CALCIUM 10.3 9.9 10.1 9.5 9.6  --   MG  --   --  2.0 1.9 1.9  --    GFR: Estimated Creatinine Clearance: 115.8 mL/min (by C-G formula based on SCr of 0.74 mg/dL). Liver Function Tests: Recent Labs  Lab 02/03/21 1654 02/04/21 0305  AST 21 42*  ALT 26 29  ALKPHOS 83 78  BILITOT 1.6* 0.9  PROT 7.0 6.3*  ALBUMIN 3.8 3.2*   No results for input(s): LIPASE, AMYLASE in the last 168 hours. No results for input(s): AMMONIA in the last 168 hours. Coagulation Profile: Recent Labs  Lab 02/03/21 1939  INR 1.1   Cardiac Enzymes: No results for input(s): CKTOTAL, CKMB, CKMBINDEX, TROPONINI in the last 168 hours. BNP (last 3 results) No results for input(s): PROBNP in the last 8760 hours. HbA1C: No results for input(s): HGBA1C in the last 72 hours. CBG: No results for input(s): GLUCAP in the last 168 hours. Lipid Profile: No results for input(s): CHOL, HDL, LDLCALC, TRIG, CHOLHDL, LDLDIRECT in the last 72 hours. Thyroid Function Tests: No results for input(s): TSH, T4TOTAL, FREET4, T3FREE, THYROIDAB in the last 72 hours. Anemia Panel: No results for input(s):  VITAMINB12, FOLATE, FERRITIN, TIBC, IRON, RETICCTPCT in the last 72 hours. Sepsis Labs: Recent Labs  Lab 02/03/21 1654 02/04/21 1917  PROCALCITON  --  0.96  LATICACIDVEN 1.1  --     Recent Results (from the past 240 hour(s))  Blood culture (routine x 2)     Status: None (Preliminary result)   Collection Time: 02/03/21  4:57 PM   Specimen: BLOOD  Result Value Ref Range Status   Specimen Description BLOOD LEFT ANTECUBITAL  Final   Special Requests   Final    BOTTLES DRAWN AEROBIC AND ANAEROBIC Blood Culture adequate volume   Culture   Final    NO GROWTH 4 DAYS Performed at [facility], [address], [place], [place] [address]    Report Status PENDING  Incomplete  Urine culture  Status: Abnormal   Collection Time: 02/03/21  7:39 PM   Specimen: In/Out Cath Urine  Result Value Ref Range Status   Specimen Description IN/OUT CATH URINE  Final   Special Requests   Final    NONE Performed at [facility], [address] [place], [place] [address]    Culture 800 COLONIES/mL STAPHYLOCOCCUS EPIDERMIDIS (A)  Final   Report Status 02/06/2021 FINAL  Final   Organism ID, Bacteria STAPHYLOCOCCUS EPIDERMIDIS (A)  Final      Susceptibility   Staphylococcus epidermidis - MIC*    CIPROFLOXACIN <=0.5 SENSITIVE Sensitive     GENTAMICIN <=0.5 SENSITIVE Sensitive     NITROFURANTOIN <=16 SENSITIVE Sensitive     OXACILLIN <=0.25 SENSITIVE Sensitive     TETRACYCLINE <=1 SENSITIVE Sensitive     VANCOMYCIN <=0.5 SENSITIVE Sensitive     TRIMETH/SULFA <=10 SENSITIVE Sensitive     CLINDAMYCIN <=0.25 SENSITIVE Sensitive     RIFAMPIN <=0.5 SENSITIVE Sensitive     Inducible Clindamycin NEGATIVE Sensitive     * 800 COLONIES/mL STAPHYLOCOCCUS EPIDERMIDIS  Blood culture (routine x 2)     Status: None (Preliminary result)   Collection Time: 02/03/21  8:10 PM   Specimen: BLOOD  Result Value Ref Range Status   Specimen Description BLOOD  Final   Special Requests NONE  Final   Culture    Final    NO GROWTH 4 DAYS Performed at [facility], [address] [place], [place] [address]    Report Status PENDING  Incomplete  Resp Panel by RT-PCR (Flu A&B, Covid) Nasopharyngeal Swab     Status: None   Collection Time: 02/03/21  9:20 PM   Specimen: Nasopharyngeal Swab; Nasopharyngeal(NP) swabs in vial transport medium  Result Value Ref Range Status   SARS Coronavirus 2 by RT PCR NEGATIVE NEGATIVE Final    Comment: (NOTE) SARS-CoV-2 target nucleic acids are NOT DETECTED.  The SARS-CoV-2 RNA is generally detectable in upper respiratory specimens during the acute phase of infection. The lowest concentration of SARS-CoV-2 viral copies this assay can detect is 138 copies/mL. A negative result does not preclude SARS-Cov-2 infection and should not be used as the sole basis for treatment or other patient management decisions. A negative result may occur with  improper specimen collection/handling, submission of specimen other than nasopharyngeal swab, presence of viral mutation(s) within the areas targeted by this assay, and inadequate number of viral copies(<138 copies/mL). A negative result must be combined with clinical observations, patient history, and epidemiological information. The expected result is Negative.  Fact Sheet for Patients:  EntrepreneurPulse.com.au  Fact Sheet for Healthcare Providers:  IncredibleEmployment.be  This test is no t yet approved or cleared by the [place] FDA and  has been authorized for detection and/or diagnosis of SARS-CoV-2 by FDA under an Emergency Use Authorization (EUA). This EUA will remain  in effect (meaning this test can be used) for the duration of the COVID-19 declaration under Section 564(b)(1) of the Act, 21 U.S.C.section 360bbb-3(b)(1), unless the authorization is terminated  or revoked sooner.       Influenza A by PCR NEGATIVE NEGATIVE Final   Influenza B by PCR NEGATIVE  NEGATIVE Final    Comment: (NOTE) The Xpert Xpress SARS-CoV-2/FLU/RSV plus assay is intended as an aid in the diagnosis of influenza from Nasopharyngeal swab specimens and should not be used as a sole basis for treatment. Nasal washings and aspirates are unacceptable for Xpert Xpress SARS-CoV-2/FLU/RSV testing.  Fact Sheet for Patients:  EntrepreneurPulse.com.au  Fact Sheet for Healthcare Providers: IncredibleEmployment.be  This test is not yet approved or cleared by the [place] FDA and has been authorized for detection and/or diagnosis of SARS-CoV-2 by FDA under an Emergency Use Authorization (EUA). This EUA will remain in effect (meaning this test can be used) for the duration of the COVID-19 declaration under Section 564(b)(1) of the Act, 21 U.S.C. section 360bbb-3(b)(1), unless the authorization is terminated or revoked.  Performed at [facility], [place] [address], [place],  [address]   Respiratory (~20 pathogens) panel by PCR     Status: None   Collection Time: 02/04/21  5:44 AM   Specimen: Nasopharyngeal Swab; Respiratory  Result Value Ref Range Status   Adenovirus NOT DETECTED NOT DETECTED Final   Coronavirus 229E NOT DETECTED NOT DETECTED Final    Comment: (NOTE) The Coronavirus on the Respiratory Panel, DOES NOT test for the novel  Coronavirus (2019 nCoV)    Coronavirus HKU1 NOT DETECTED NOT DETECTED Final   Coronavirus NL63 NOT DETECTED NOT DETECTED Final   Coronavirus OC43 NOT DETECTED NOT DETECTED Final   Metapneumovirus NOT DETECTED NOT DETECTED Final   Rhinovirus / Enterovirus NOT DETECTED NOT DETECTED Final   Influenza A NOT DETECTED NOT DETECTED Final   Influenza B NOT DETECTED NOT DETECTED Final   Parainfluenza Virus 1 NOT DETECTED NOT DETECTED Final   Parainfluenza Virus 2 NOT DETECTED NOT DETECTED Final   Parainfluenza Virus 3 NOT DETECTED NOT DETECTED Final   Parainfluenza Virus 4 NOT DETECTED NOT  DETECTED Final   Respiratory Syncytial Virus NOT DETECTED NOT DETECTED Final   Bordetella pertussis NOT DETECTED NOT DETECTED Final   Bordetella Parapertussis NOT DETECTED NOT DETECTED Final   Chlamydophila pneumoniae NOT DETECTED NOT DETECTED Final   Mycoplasma pneumoniae NOT DETECTED NOT DETECTED Final    Comment: Performed at [facility], [address], [place], [place] [address]      Radiology Studies: CARDIAC CATHETERIZATION  Result Date: 02/07/2021  Prox Cx lesion is 25% stenosed.  1st Diag lesion is 50% stenosed.  2nd Diag lesion is 30% stenosed.  Prox RCA to Dist RCA lesion is 100% stenosed.  LV end diastolic pressure is normal.  There is moderate aortic valve stenosis.  Ost RCA to Prox RCA lesion is 75% stenosed.  1. 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 2. Normal LVEDP 3. Moderate Aortic stenosis with approximately 30 mm Hg gradient across the valve. Plan: recommend medical management. The RCA does not appear suitable for CTO PCI.   DG CHEST PORT 1 VIEW  Result Date: 02/06/2021 CLINICAL DATA:  Cough EXAM: PORTABLE CHEST 1 VIEW COMPARISON:  02/04/2021 FINDINGS: Single frontal view of the chest demonstrates an unremarkable cardiac silhouette. Scattered areas of ground-glass airspace disease are seen within the mid and lower lung zones, similar to previous CT exam. No effusion or pneumothorax. No acute bony abnormalities. IMPRESSION: 1. Stable scattered bibasilar ground-glass airspace disease, which may reflect multifocal infection or aspiration. Electronically Signed   By: Randa Ngo M.D.   On: 02/06/2021 03:04     Scheduled Meds: . aspirin  81 mg Oral Daily  . [START ON 02/08/2021] enoxaparin (LOVENOX) injection  40 mg Subcutaneous Q24H  . famotidine  20 mg Oral Daily  . metoprolol tartrate  12.5 mg Oral BID  . nicotine  21 mg Transdermal Daily  . rosuvastatin  40 mg Oral Daily  . sodium chloride  flush  3 mL  Intravenous Q12H  . [START ON 02/08/2021] sodium chloride flush  3 mL Intravenous Q12H   Continuous Infusions: . [START ON 02/08/2021] sodium chloride    . sodium chloride    . cefTRIAXone (ROCEPHIN)  IV 2 g (02/07/21 1247)  . doxycycline (VIBRAMYCIN) IV 100 mg (02/07/21 1027)     LOS: 2 days     Enzo Bi, MD [facility] If 7PM-7AM, please contact night-coverage 02/07/2021, 7:27 PM

## 2021-02-08 ENCOUNTER — Encounter (HOSPITAL_COMMUNITY): Payer: Self-pay | Admitting: Cardiology

## 2021-02-08 ENCOUNTER — Other Ambulatory Visit: Payer: Self-pay | Admitting: Sports Medicine

## 2021-02-08 DIAGNOSIS — R072 Precordial pain: Secondary | ICD-10-CM | POA: Diagnosis not present

## 2021-02-08 DIAGNOSIS — R059 Cough, unspecified: Secondary | ICD-10-CM | POA: Diagnosis not present

## 2021-02-08 DIAGNOSIS — M25511 Pain in right shoulder: Secondary | ICD-10-CM

## 2021-02-08 DIAGNOSIS — I214 Non-ST elevation (NSTEMI) myocardial infarction: Secondary | ICD-10-CM | POA: Diagnosis not present

## 2021-02-08 DIAGNOSIS — I10 Essential (primary) hypertension: Secondary | ICD-10-CM | POA: Diagnosis not present

## 2021-02-08 LAB — BASIC METABOLIC PANEL
Anion gap: 7 (ref 5–15)
BUN: 7 mg/dL — ABNORMAL LOW (ref 8–23)
CO2: 24 mmol/L (ref 22–32)
Calcium: 9.7 mg/dL (ref 8.9–10.3)
Chloride: 105 mmol/L (ref 98–111)
Creatinine, Ser: 0.94 mg/dL (ref 0.61–1.24)
GFR, Estimated: >60 mL/min (ref >60)
Glucose, Bld: 92 mg/dL (ref 70–99)
Potassium: 3.9 mmol/L (ref 3.5–5.1)
Sodium: 136 mmol/L (ref 135–145)

## 2021-02-08 LAB — CBC
HCT: 37.5 % — ABNORMAL LOW (ref 39.0–52.0)
Hemoglobin: 12.7 g/dL — ABNORMAL LOW (ref 13.0–17.0)
MCH: 33.7 pg (ref 26.0–34.0)
MCHC: 33.9 g/dL (ref 30.0–36.0)
MCV: 99.5 fL (ref 80.0–100.0)
Platelets: 182 10*3/uL (ref 150–400)
RBC: 3.77 MIL/uL — ABNORMAL LOW (ref 4.22–5.81)
RDW: 12.8 % (ref 11.5–15.5)
WBC: 7.6 10*3/uL (ref 4.0–10.5)
nRBC: 0 % (ref 0.0–0.2)

## 2021-02-08 LAB — CULTURE, BLOOD (ROUTINE X 2)
Culture: NO GROWTH
Culture: NO GROWTH
Special Requests: ADEQUATE

## 2021-02-08 LAB — MAGNESIUM: Magnesium: 2 mg/dL (ref 1.7–2.4)

## 2021-02-08 MED ORDER — POLYETHYLENE GLYCOL 3350 17 G PO PACK: 17.0000 g | PACK | Freq: Two times a day (BID) | ORAL | 0 refills | Status: DC | PRN

## 2021-02-08 MED ORDER — GUAIFENESIN-DM 100-10 MG/5ML PO SYRP: 10.0000 mL | ORAL_SOLUTION | Freq: Four times a day (QID) | ORAL | 0 refills | Status: DC | PRN

## 2021-02-08 MED ORDER — METOPROLOL TARTRATE 25 MG PO TABS: 12.5000 mg | ORAL_TABLET | Freq: Two times a day (BID) | ORAL | 2 refills | Status: DC

## 2021-02-08 MED ORDER — CALCIUM CARBONATE ANTACID 500 MG PO CHEW: 1.0000 | CHEWABLE_TABLET | Freq: Three times a day (TID) | ORAL | Status: DC | PRN

## 2021-02-08 MED ORDER — BENZONATATE 100 MG PO CAPS
100.0000 mg | ORAL_CAPSULE | Freq: Three times a day (TID) | ORAL | 0 refills | Status: DC | PRN
Start: 2021-02-08 — End: 2021-03-14

## 2021-02-08 MED ORDER — ROSUVASTATIN CALCIUM 40 MG PO TABS: 40.0000 mg | ORAL_TABLET | Freq: Every day | ORAL | 1 refills | Status: DC

## 2021-02-08 MED ORDER — DOCUSATE SODIUM 100 MG PO CAPS: 100.0000 mg | ORAL_CAPSULE | Freq: Two times a day (BID) | ORAL | 0 refills | Status: DC | PRN

## 2021-02-08 MED ORDER — AMOXICILLIN-POT CLAVULANATE 875-125 MG PO TABS: 1.0000 | ORAL_TABLET | Freq: Two times a day (BID) | ORAL | 0 refills | Status: AC

## 2021-02-08 MED ORDER — NICOTINE 21 MG/24HR TD PT24: 21.0000 mg | MEDICATED_PATCH | Freq: Every day | TRANSDERMAL | 0 refills | Status: DC

## 2021-02-08 MED ORDER — NITROGLYCERIN 0.4 MG SL SUBL: 0.4000 mg | SUBLINGUAL_TABLET | SUBLINGUAL | 12 refills | Status: DC | PRN

## 2021-02-08 NOTE — Progress Notes (Signed)
Progress Note  Patient Name: [name] Date of Encounter: 02/08/2021  [facility] Cardiologist: Dr Stanford Breed  Subjective   Pt denies CP or dyspnea  Inpatient Medications    Scheduled Meds: . aspirin  81 mg Oral Daily  . enoxaparin (LOVENOX) injection  40 mg Subcutaneous Q24H  . famotidine  20 mg Oral Daily  . metoprolol tartrate  12.5 mg Oral BID  . nicotine  21 mg Transdermal Daily  . rosuvastatin  40 mg Oral Daily  . sodium chloride flush  3 mL Intravenous Q12H  . sodium chloride flush  3 mL Intravenous Q12H   Continuous Infusions: . sodium chloride    . cefTRIAXone (ROCEPHIN)  IV 2 g (02/07/21 1247)  . doxycycline (VIBRAMYCIN) IV 100 mg (02/07/21 2225)   PRN Meds: sodium chloride, acetaminophen **OR** acetaminophen, alum & mag hydroxide-simeth, benzonatate, calcium carbonate, docusate sodium, guaiFENesin-dextromethorphan, nitroGLYCERIN, ondansetron (ZOFRAN) IV, ondansetron, polyethylene glycol, sodium chloride flush   Vital Signs    Vitals:   02/07/21 1642 02/07/21 1702 02/07/21 2108 02/08/21 0453  BP: (!) 144/86 132/84 124/71 121/67  Pulse: 62 61 60 61  Resp: 14 18 19 18   Temp:  98.5 F (36.9 C) 98.5 F (36.9 C) 98.5 F (36.9 C)  TempSrc:  Oral Oral Oral  SpO2: 97% 93% 93% 98%  Weight:      Height:        Intake/Output Summary (Last 24 hours) at 02/08/2021 0900 Last data filed at 02/07/2021 1027 Gross per 24 hour  Intake 722 ml  Output --  Net 722 ml   Last 3 Weights 02/07/2021 02/04/2021 02/03/2021  Weight (lbs) 247 lb 14.4 oz 246 lb 12.8 oz 257 lb 0.9 oz  Weight (kg) 112.447 kg 111.948 kg 116.6 kg      Telemetry    Sinus- Personally Reviewed  Physical Exam   GEN: NAD Neck: supple Cardiac: RRR, 2/6 systolic murmur; no DM Respiratory: CTA; no rhonchi GI: Soft, NT/ND MS: No edema, radial cath site with no hematoma Neuro:  grossly intact Psych: Normal affect   Labs    High Sensitivity Troponin:   Recent Labs  Lab 02/04/21 0506  02/04/21 0954 02/04/21 1355 02/04/21 1832 02/04/21 2148  TROPONINIHS 634* 2,883* 3,228* 2,610* 2,373*      Chemistry Recent Labs  Lab 02/03/21 1654 02/04/21 0305 02/05/21 0418 02/06/21 0129 02/07/21 0316 02/07/21 1731 02/08/21 0308  NA 134* 133*   < > 136 138  --  136  K 3.8 3.2*   < > 3.9 3.8  --  3.9  CL 104 101   < > 104 105  --  105  CO2 23 18*   < > 26 24  --  24  GLUCOSE 121* 245*   < > 83 96  --  92  BUN 11 13   < > 20 10  --  7*  CREATININE 1.01 1.38*   < > 0.84 0.83 0.74 0.94  CALCIUM 10.3 9.9   < > 9.5 9.6  --  9.7  PROT 7.0 6.3*  --   --   --   --   --   ALBUMIN 3.8 3.2*  --   --   --   --   --   AST 21 42*  --   --   --   --   --   ALT 26 29  --   --   --   --   --   ALKPHOS 83  78  --   --   --   --   --   BILITOT 1.6* 0.9  --   --   --   --   --   GFRNONAA >60 56*   < > >60 >60 >60 >60  ANIONGAP 7 14   < > 6 9  --  7   < > = values in this interval not displayed.     Hematology Recent Labs  Lab 02/07/21 0316 02/07/21 1731 02/08/21 0308  WBC 7.4 6.7 7.6  RBC 3.85* 3.64* 3.77*  HGB 13.1 12.4* 12.7*  HCT 38.3* 36.5* 37.5*  MCV 99.5 100.3* 99.5  MCH 34.0 34.1* 33.7  MCHC 34.2 34.0 33.9  RDW 13.0 12.9 12.8  PLT 167 163 182    BNP Recent Labs  Lab 02/03/21 1654  BNP 103.0*     DDimer  Recent Labs  Lab 02/03/21 1939  DDIMER 1.13*     Radiology    CARDIAC CATHETERIZATION  Result Date: 02/07/2021  Prox Cx lesion is 25% stenosed.  1st Diag lesion is 50% stenosed.  2nd Diag lesion is 30% stenosed.  Prox RCA to Dist RCA lesion is 100% stenosed.  LV end diastolic pressure is normal.  There is moderate aortic valve stenosis.  Ost RCA to Prox RCA lesion is 75% stenosed.  1. 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 2. Normal LVEDP 3. Moderate Aortic stenosis with approximately 30 mm Hg gradient across the valve. Plan: recommend medical management. The RCA does not appear suitable for CTO PCI.     Patient Profile     65 year old male with past medical history of hypertension, hyperlipidemia, peripheral vascular disease, tobacco abuse, COPD for evaluation of chest pain and elevated troponin. Admitted with URI and chest tightness. CTA shows no pulmonary embolus; multifocal infection vs aspiration and 3 vessel coronary calcification. Troponin is 12, 14, 216, 634, 2883 and 3228.  BNP 103.  Echocardiogram shows normal LV function, grade 1 diastolic dysfunction, moderate aortic stenosis with mean gradient 26 mmHg.  Cardiac catheterization showed 25% circumflex, 50% first diagonal, 30% second diagonal, occluded right coronary artery, normal left ventricular end-diastolic pressure and moderate aortic stenosis.  It was noted that right coronary did not appear suitable for CTO PCI.  Assessment & Plan    1 non-ST elevation myocardial infarction-cardiac catheterization reveals occluded right coronary artery but otherwise nonobstructive disease.  Plan medical therapy.  Continue aspirin, Plavix, beta-blocker and statin.  2 upper respiratory infection/pneumonia-management per primary care.  3 tobacco abuse-needs to discontinue.  4 aortic stenosis-moderate on recent echocardiogram.  He will need follow-up studies in the future.  5 hypertension-patient's blood pressure appears to be controlled.  Continue present medical regimen.  For questions or updates, please contact [place] Please consult [email or web address] for contact info under        Signed, [name], MD  02/08/2021, 9:00 AM

## 2021-02-08 NOTE — Progress Notes (Signed)
Pt is alert and oriented. Discharge instructions/ AVS given to pt. 

## 2021-02-08 NOTE — Discharge Summary (Signed)
Physician Discharge Summary  [name] MGQ:[id number] DOB: 1956-01-23 DOA: 02/03/2021  PCP: Orpah Melter, MD  Admit date: 02/03/2021 Discharge date: 02/08/2021  Admitted From: Home Disposition: Home  Recommendations for Outpatient Follow-up:  1. Follow up with PCP in 1-2 weeks 2. Please obtain BMP/CBC in one week Please follow up with cardiology as recommended  Discharge Condition: Guarded  CODE STATUS: Full code Diet recommendation: Heart Healthy   Brief/Interim Summary:  Jerry Tosh Hedrickis a 65 y.o.malewithhistory of hypertension, hyperlipidemia, vascular disease, tobacco abuse presents to the ER with complaints of having chest pain with nonproductive cough and diarrhea. Patient symptoms have been present for the last 3 days. Chest pain is retrosternal pressure-like increased on exertion.    Discharge Diagnoses:  Principal Problem:   Chest pain Active Problems:   Acute bronchitis   Fever in adult   Diarrhea   Essential hypertension   Hyperlipidemia   HTN (hypertension)   BMI 34.0-34.9,adult   NSTEMI (non-ST elevated myocardial infarction) (HCC)  Chest pain, POA NSTEMI -history of vascular disease with history of tobacco abuse hypertension hyperlipidemia  --troponin trending up to 3000's --CTA chest, neg for PE through the proximal segmental pulmonary arterial level. 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 --COVID neg, RVP neg.  procal elevated - completed 3 days of IV antibiotics, transition to oral antibiotics to complete the course on discharge.   Diarrhea, resolved -has not been on any antibiotics recently. Denies  any recent travel or sick contacts.   Tick bite --tick-born illness titers ordered on admission. Pt started on doxycycline, he is currently refusing to take doxycycline, recommend outpatient follow up of the titers.   Hypertension Blood pressure parameters are optimal  Hyperlipidemia --cont statin  Hypokalemia --monitor and replete PRN  Current smoker --nicotine patch  aortic atherosclerosis --cont ASA and statin  Aortic stenosis, mod Follow-up echocardiogram in the future   Discharge Instructions  Discharge Instructions    Diet - low sodium heart healthy   Complete by: As directed    Discharge instructions   Complete by: As directed    Please follow up with PCP in one week.     Allergies as of 02/08/2021   No Known Allergies     Medication List    STOP taking these medications   ibuprofen 200 MG tablet Commonly known as: ADVIL   lovastatin 40 MG tablet Commonly known as: MEVACOR   meloxicam 15 MG tablet Commonly known as: MOBIC     TAKE these medications   amlodipine-olmesartan 10-20 MG tablet Commonly known as: AZOR Take 1 tablet by mouth daily.   amoxicillin-clavulanate 875-125 MG tablet Commonly known as: Augmentin Take 1 tablet by mouth every 12 (twelve) hours for 4 days.   aspirin 81 MG chewable tablet Chew 81 mg by mouth daily.   benzonatate 100 MG capsule Commonly known as: TESSALON Take 1 capsule (100 mg total) by mouth 3 (three) times daily as needed for cough.   calcium carbonate 500 MG chewable tablet Commonly known as: TUMS - dosed in mg elemental calcium Chew 1 tablet (200 mg of elemental calcium total) by mouth 3 (three) times daily as needed for indigestion or heartburn.   docusate sodium 100 MG capsule Commonly known as:  COLACE Take 1 capsule (100 mg total) by mouth 2 (two) times daily as needed for mild constipation.   famotidine 20 MG tablet Commonly known as: PEPCID Take 20 mg by mouth daily.    guaiFENesin-dextromethorphan 100-10 MG/5ML syrup Commonly known as: ROBITUSSIN DM Take 10 mLs by mouth every 6 (six) hours as needed for cough.   metoprolol tartrate 25 MG tablet Commonly known as: LOPRESSOR Take 0.5 tablets (12.5 mg total) by mouth 2 (two) times daily.   nicotine 21 mg/24hr patch Commonly known as: NICODERM CQ - dosed in mg/24 hours Place 1 patch (21 mg total) onto the skin daily.   nitroGLYCERIN 0.4 MG SL tablet Commonly known as: NITROSTAT Place 1 tablet (0.4 mg total) under the tongue every 5 (five) minutes as needed for chest pain.   polyethylene glycol 17 g packet Commonly known as: MIRALAX / GLYCOLAX Take 17 g by mouth 2 (two) times daily as needed for moderate constipation.   rosuvastatin 40 MG tablet Commonly known as: CRESTOR Take 1 tablet (40 mg total) by mouth daily.   traMADol 50 MG tablet Commonly known as: ULTRAM Take 50 mg by mouth 2 (two) times daily as needed for moderate pain.       No Known Allergies  Consultations:  Cardiology   Procedures/Studies: CT ANGIO CHEST PE W OR WO CONTRAST  Result Date: 02/04/2021 CLINICAL DATA:  Chest pain, shortness of breath, concern for PE EXAM: CT ANGIOGRAPHY CHEST WITH CONTRAST TECHNIQUE: Multidetector CT imaging of the chest was performed using the standard protocol during bolus administration of intravenous contrast. Multiplanar CT image reconstructions and MIPs were obtained to evaluate the vascular anatomy. CONTRAST:  36mL OMNIPAQUE IOHEXOL 350 MG/ML SOLN COMPARISON:  None. FINDINGS: Cardiovascular: Examination for pulmonary embolism is limited by breath motion artifact. Within this limitation, no evidence of pulmonary embolism through the proximal segmental pulmonary arterial level. Three-vessel coronary artery calcification. No pericardial effusion. Severe, mixed aortic atherosclerosis. Mediastinum/Nodes: No enlarged mediastinal, hilar, or axillary lymph nodes. Thyroid gland, trachea, and esophagus  demonstrate no significant findings. Lungs/Pleura: There is scattered heterogeneous and ground-glass airspace opacity throughout, for example in the dependent right lower lobe (series 9, image 61) and in the posterior left upper lobe (series 9, image 40). Evaluation of airspace disease is likewise somewhat limited by breath motion artifact. No pleural effusion or pneumothorax. Upper Abdomen: No acute abnormality. Musculoskeletal: No chest wall abnormality. No acute or significant osseous findings. Review of the MIP images confirms the above findings. IMPRESSION: 1. Examination for pulmonary embolism is limited by breath motion artifact. Within this limitation, no evidence of pulmonary embolism through the proximal segmental pulmonary arterial level. 2. Scattered heterogeneous and ground-glass airspace opacity throughout, for example in the dependent right lower lobe and in the posterior left upper lobe. Evaluation of airspace disease is likewise somewhat limited by breath motion artifact. Findings are consistent with multifocal infection or aspiration. 3. Coronary artery disease. Aortic Atherosclerosis (ICD10-I70.0). Electronically Signed   By: Eddie Candle M.D.   On: 02/04/2021 17:18   CARDIAC CATHETERIZATION  Result Date: 02/07/2021  Prox Cx lesion is 25% stenosed.  1st Diag lesion is 50% stenosed.  2nd Diag lesion is 30% stenosed.  Prox RCA to Dist RCA lesion is 100% stenosed.  LV end diastolic pressure is normal.  There is moderate aortic valve stenosis.  Ost RCA to Prox RCA lesion is 75% stenosed.  1. 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 2. Normal  LVEDP 3. Moderate Aortic stenosis with approximately 30 mm Hg gradient across the valve. Plan: recommend medical management. The RCA does not appear suitable for CTO PCI.   DG CHEST PORT 1 VIEW  Result Date: 02/06/2021 CLINICAL DATA:  Cough EXAM: PORTABLE CHEST 1 VIEW COMPARISON:  02/04/2021 FINDINGS:  Single frontal view of the chest demonstrates an unremarkable cardiac silhouette. Scattered areas of ground-glass airspace disease are seen within the mid and lower lung zones, similar to previous CT exam. No effusion or pneumothorax. No acute bony abnormalities. IMPRESSION: 1. Stable scattered bibasilar ground-glass airspace disease, which may reflect multifocal infection or aspiration. Electronically Signed   By: Randa Ngo M.D.   On: 02/06/2021 03:04   DG Chest Port 1 View  Result Date: 02/03/2021 CLINICAL DATA:  Chest pain and diarrhea EXAM: PORTABLE CHEST 1 VIEW COMPARISON:  07/17/2007 FINDINGS: The heart size and mediastinal contours are within normal limits. Both lungs are clear. The visualized skeletal structures are unremarkable. IMPRESSION: No active disease. Electronically Signed   By: Donavan Foil M.D.   On: 02/03/2021 19:08   ECHOCARDIOGRAM COMPLETE  Result Date: 02/04/2021    ECHOCARDIOGRAM REPORT   Patient Name:   RYO KLANG Date of Exam: 02/04/2021 Medical Rec #:  [id number]        Height:       71.0 in Accession #:    [id number]       Weight:       246.8 lb Date of Birth:  [birth year]        BSA:          2.306 m Patient Age:    4 years         BP:           107/79 mmHg Patient Gender: M                HR:           87 bpm. Exam Location:  Inpatient Procedure: 2D Echo, Cardiac Doppler and Color Doppler Indications:    R07.9* Chest pain, unspecified  History:        Patient has prior history of Echocardiogram examinations, most                 recent 04/29/2020. Signs/Symptoms:Shortness of Breath. Coughing.  Sonographer:    Merrie Roof RDCS Referring Phys: [place]  1. Left ventricular ejection fraction, by estimation, is 60 to 65%. The left ventricle has normal function. The left ventricle has no regional wall motion abnormalities. Left ventricular diastolic parameters are consistent with Grade I diastolic dysfunction (impaired relaxation). Elevated left  atrial pressure.  2. Right ventricular systolic function is normal. The right ventricular size is normal.  3. The mitral valve is normal in structure. No evidence of mitral valve regurgitation. No evidence of mitral stenosis.  4. The aortic valve has an indeterminant number of cusps. Aortic valve regurgitation is not visualized. Moderate aortic valve stenosis.  5. The inferior vena cava is normal in size with greater than 50% respiratory variability, suggesting right atrial pressure of 3 mmHg. FINDINGS  Left Ventricle: Left ventricular ejection fraction, by estimation, is 60 to 65%. The left ventricle has normal function. The left ventricle has no regional wall motion abnormalities. The left ventricular internal cavity size was normal in size. There is  no left ventricular hypertrophy. Left ventricular diastolic parameters are consistent with Grade I diastolic dysfunction (impaired relaxation). Elevated left atrial pressure. Right Ventricle: The right  ventricular size is normal. Right ventricular systolic function is normal. Left Atrium: Left atrial size was normal in size. Right Atrium: Right atrial size was normal in size. Pericardium: There is no evidence of pericardial effusion. Mitral Valve: The mitral valve is normal in structure. Mild mitral annular calcification. No evidence of mitral valve regurgitation. No evidence of mitral valve stenosis. Tricuspid Valve: The tricuspid valve is normal in structure. Tricuspid valve regurgitation is trivial. No evidence of tricuspid stenosis. Aortic Valve: The aortic valve has an indeterminant number of cusps. Aortic valve regurgitation is not visualized. Moderate aortic stenosis is present. Aortic valve mean gradient measures 25.8 mmHg. Aortic valve peak gradient measures 45.2 mmHg. Aortic valve area, by VTI measures 1.28 cm. Pulmonic Valve: The pulmonic valve was not well visualized. Pulmonic valve regurgitation is not visualized. No evidence of pulmonic stenosis.  Aorta: The aortic root is normal in size and structure. Venous: The inferior vena cava is normal in size with greater than 50% respiratory variability, suggesting right atrial pressure of 3 mmHg.   LEFT VENTRICLE PLAX 2D LVIDd:         5.30 cm  Diastology LVIDs:         4.00 cm  LV e' medial:    9.46 cm/s LV PW:         1.00 cm  LV E/e' medial:  11.3 LV IVS:        1.20 cm  LV e' lateral:   6.31 cm/s LVOT diam:     2.10 cm  LV E/e' lateral: 17.0 LV SV:         84 LV SV Index:   37 LVOT Area:     3.46 cm  RIGHT VENTRICLE RV Basal diam:  3.90 cm LEFT ATRIUM           Index       RIGHT ATRIUM           Index LA diam:      3.00 cm 1.30 cm/m  RA Area:     24.60 cm LA Vol (A2C): 83.9 ml 36.39 ml/m RA Volume:   85.30 ml  37.00 ml/m LA Vol (A4C): 35.6 ml 15.44 ml/m  AORTIC VALVE AV Area (Vmax):    1.17 cm AV Area (Vmean):   1.16 cm AV Area (VTI):     1.28 cm AV Vmax:           336.20 cm/s AV Vmean:          237.200 cm/s AV VTI:            0.658 m AV Peak Grad:      45.2 mmHg AV Mean Grad:      25.8 mmHg LVOT Vmax:         114.00 cm/s LVOT Vmean:        79.550 cm/s LVOT VTI:          0.243 m LVOT/AV VTI ratio: 0.37  AORTA Ao Root diam: 3.70 cm MITRAL VALVE MV Area (PHT): 2.96 cm     SHUNTS MV Decel Time: 256 msec     Systemic VTI:  0.24 m MV E velocity: 107.00 cm/s  Systemic Diam: 2.10 cm MV A velocity: 116.00 cm/s MV E/A ratio:  0.92 [name] MD Electronically signed by [name] MD Signature Date/Time: 02/04/2021/12:00:26 PM    Final         Subjective: No new complaints.   Discharge Exam: Vitals:   02/08/21 0453 02/08/21 0950  BP: 121/67  119/80  Pulse: 61 63  Resp: 18   Temp: 98.5 F (36.9 C)   SpO2: 98%    Vitals:   02/07/21 1702 02/07/21 2108 02/08/21 0453 02/08/21 0950  BP: 132/84 124/71 121/67 119/80  Pulse: 61 60 61 63  Resp: 18 19 18    Temp: 98.5 F (36.9 C) 98.5 F (36.9 C) 98.5 F (36.9 C)   TempSrc: Oral Oral Oral   SpO2: 93% 93% 98%   Weight:      Height:         General: Pt is alert, awake, not in acute distress Cardiovascular: RRR, S1/S2 +, no rubs, no gallops Respiratory: CTA bilaterally, no wheezing, no rhonchi Abdominal: Soft, NT, ND, bowel sounds + Extremities: no edema, no cyanosis    The results of significant diagnostics from this hospitalization (including imaging, microbiology, ancillary and laboratory) are listed below for reference.     Microbiology: Recent Results (from the past 240 hour(s))  Blood culture (routine x 2)     Status: None   Collection Time: 02/03/21  4:57 PM   Specimen: BLOOD  Result Value Ref Range Status   Specimen Description BLOOD LEFT ANTECUBITAL  Final   Special Requests   Final    BOTTLES DRAWN AEROBIC AND ANAEROBIC Blood Culture adequate volume   Culture   Final    NO GROWTH 5 DAYS Performed at [facility], [address] [place], [place] [address]    Report Status 02/08/2021 FINAL  Final  Urine culture     Status: Abnormal   Collection Time: 02/03/21  7:39 PM   Specimen: In/Out Cath Urine  Result Value Ref Range Status   Specimen Description IN/OUT CATH URINE  Final   Special Requests   Final    NONE Performed at [facility], [place] [address], [place], [place] [address]    Culture 800 COLONIES/mL STAPHYLOCOCCUS EPIDERMIDIS (A)  Final   Report Status 02/06/2021 FINAL  Final   Organism ID, Bacteria STAPHYLOCOCCUS EPIDERMIDIS (A)  Final      Susceptibility   Staphylococcus epidermidis - MIC*    CIPROFLOXACIN <=0.5 SENSITIVE Sensitive     GENTAMICIN <=0.5 SENSITIVE Sensitive     NITROFURANTOIN <=16 SENSITIVE Sensitive     OXACILLIN <=0.25 SENSITIVE Sensitive     TETRACYCLINE <=1 SENSITIVE Sensitive     VANCOMYCIN <=0.5 SENSITIVE Sensitive     TRIMETH/SULFA <=10 SENSITIVE Sensitive     CLINDAMYCIN <=0.25 SENSITIVE Sensitive     RIFAMPIN <=0.5 SENSITIVE Sensitive     Inducible Clindamycin NEGATIVE Sensitive     * 800 COLONIES/mL STAPHYLOCOCCUS EPIDERMIDIS  Blood culture  (routine x 2)     Status: None   Collection Time: 02/03/21  8:10 PM   Specimen: BLOOD  Result Value Ref Range Status   Specimen Description BLOOD  Final   Special Requests NONE  Final   Culture   Final    NO GROWTH 5 DAYS Performed at [facility], [place] [address] [place], [place] [address]    Report Status 02/08/2021 FINAL  Final  Resp Panel by RT-PCR (Flu A&B, Covid) Nasopharyngeal Swab     Status: None   Collection Time: 02/03/21  9:20 PM   Specimen: Nasopharyngeal Swab; Nasopharyngeal(NP) swabs in vial transport medium  Result Value Ref Range Status   SARS Coronavirus 2 by RT PCR NEGATIVE NEGATIVE Final    Comment: (NOTE) SARS-CoV-2 target nucleic acids are NOT DETECTED.  The SARS-CoV-2 RNA is generally detectable in  upper respiratory specimens during the acute phase of infection. The lowest concentration of SARS-CoV-2 viral copies this assay can detect is 138 copies/mL. A negative result does not preclude SARS-Cov-2 infection and should not be used as the sole basis for treatment or other patient management decisions. A negative result may occur with  improper specimen collection/handling, submission of specimen other than nasopharyngeal swab, presence of viral mutation(s) within the areas targeted by this assay, and inadequate number of viral copies(<138 copies/mL). A negative result must be combined with clinical observations, patient history, and epidemiological information. The expected result is Negative.  Fact Sheet for Patients:  EntrepreneurPulse.com.au  Fact Sheet for Healthcare Providers:  IncredibleEmployment.be  This test is no t yet approved or cleared by the [place] FDA and  has been authorized for detection and/or diagnosis of SARS-CoV-2 by FDA under an Emergency Use Authorization (EUA). This EUA will remain  in effect (meaning this test can be used) for the duration of the COVID-19 declaration under Section  564(b)(1) of the Act, 21 U.S.C.section 360bbb-3(b)(1), unless the authorization is terminated  or revoked sooner.       Influenza A by PCR NEGATIVE NEGATIVE Final   Influenza B by PCR NEGATIVE NEGATIVE Final    Comment: (NOTE) The Xpert Xpress SARS-CoV-2/FLU/RSV plus assay is intended as an aid in the diagnosis of influenza from Nasopharyngeal swab specimens and should not be used as a sole basis for treatment. Nasal washings and aspirates are unacceptable for Xpert Xpress SARS-CoV-2/FLU/RSV testing.  Fact Sheet for Patients: EntrepreneurPulse.com.au  Fact Sheet for Healthcare Providers: IncredibleEmployment.be  This test is not yet approved or cleared by the [place] FDA and has been authorized for detection and/or diagnosis of SARS-CoV-2 by FDA under an Emergency Use Authorization (EUA). This EUA will remain in effect (meaning this test can be used) for the duration of the COVID-19 declaration under Section 564(b)(1) of the Act, 21 U.S.C. section 360bbb-3(b)(1), unless the authorization is terminated or revoked.  Performed at [facility], [place] [address], [place], [place] [address]   Respiratory (~20 pathogens) panel by PCR     Status: None   Collection Time: 02/04/21  5:44 AM   Specimen: Nasopharyngeal Swab; Respiratory  Result Value Ref Range Status   Adenovirus NOT DETECTED NOT DETECTED Final   Coronavirus 229E NOT DETECTED NOT DETECTED Final    Comment: (NOTE) The Coronavirus on the Respiratory Panel, DOES NOT test for the novel  Coronavirus (2019 nCoV)    Coronavirus HKU1 NOT DETECTED NOT DETECTED Final   Coronavirus NL63 NOT DETECTED NOT DETECTED Final   Coronavirus OC43 NOT DETECTED NOT DETECTED Final   Metapneumovirus NOT DETECTED NOT DETECTED Final   Rhinovirus / Enterovirus NOT DETECTED NOT DETECTED Final   Influenza A NOT DETECTED NOT DETECTED Final   Influenza B NOT DETECTED NOT DETECTED Final   Parainfluenza  Virus 1 NOT DETECTED NOT DETECTED Final   Parainfluenza Virus 2 NOT DETECTED NOT DETECTED Final   Parainfluenza Virus 3 NOT DETECTED NOT DETECTED Final   Parainfluenza Virus 4 NOT DETECTED NOT DETECTED Final   Respiratory Syncytial Virus NOT DETECTED NOT DETECTED Final   Bordetella pertussis NOT DETECTED NOT DETECTED Final   Bordetella Parapertussis NOT DETECTED NOT DETECTED Final   Chlamydophila pneumoniae NOT DETECTED NOT DETECTED Final   Mycoplasma pneumoniae NOT DETECTED NOT DETECTED Final    Comment: Performed at [facility] [place] [address] [place], [place] [address]     Labs: BNP (  last 3 results) Recent Labs    02/03/21 1654  BNP 466.5*   Basic Metabolic Panel: Recent Labs  Lab 02/04/21 0305 02/05/21 0418 02/06/21 0129 02/07/21 0316 02/07/21 1731 02/08/21 0308  NA 133* 136 136 138  --  136  K 3.2* 4.2 3.9 3.8  --  3.9  CL 101 107 104 105  --  105  CO2 18* 22 26 24   --  24  GLUCOSE 245* 109* 83 96  --  92  BUN 13 20 20 10   --  7*  CREATININE 1.38* 0.85 0.84 0.83 0.74 0.94  CALCIUM 9.9 10.1 9.5 9.6  --  9.7  MG  --  2.0 1.9 1.9  --  2.0   Liver Function Tests: Recent Labs  Lab 02/03/21 1654 02/04/21 0305  AST 21 42*  ALT 26 29  ALKPHOS 83 78  BILITOT 1.6* 0.9  PROT 7.0 6.3*  ALBUMIN 3.8 3.2*   No results for input(s): LIPASE, AMYLASE in the last 168 hours. No results for input(s): AMMONIA in the last 168 hours. CBC: Recent Labs  Lab 02/03/21 1654 02/04/21 0305 02/05/21 0418 02/06/21 0129 02/07/21 0316 02/07/21 1731 02/08/21 0308  WBC 11.9* 11.2* 16.0* 12.0* 7.4 6.7 7.6  NEUTROABS 9.7* 10.2*  --   --   --   --   --   HGB 15.1 14.0 14.2 13.4 13.1 12.4* 12.7*  HCT 43.2 40.9 41.6 39.2 38.3* 36.5* 37.5*  MCV 98.6 101.2* 99.3 101.0* 99.5 100.3* 99.5  PLT 150 151 184 163 167 163 182   Cardiac Enzymes: No results for input(s): CKTOTAL, CKMB, CKMBINDEX, TROPONINI in the last 168 hours. BNP: Invalid input(s): POCBNP CBG: No results for  input(s): GLUCAP in the last 168 hours. D-Dimer No results for input(s): DDIMER in the last 72 hours. Hgb A1c No results for input(s): HGBA1C in the last 72 hours. Lipid Profile No results for input(s): CHOL, HDL, LDLCALC, TRIG, CHOLHDL, LDLDIRECT in the last 72 hours. Thyroid function studies No results for input(s): TSH, T4TOTAL, T3FREE, THYROIDAB in the last 72 hours.  Invalid input(s): FREET3 Anemia work up No results for input(s): VITAMINB12, FOLATE, FERRITIN, TIBC, IRON, RETICCTPCT in the last 72 hours. Urinalysis    Component Value Date/Time   COLORURINE AMBER (A) 02/03/2021 2051   APPEARANCEUR CLEAR 02/03/2021 2051   LABSPEC 1.018 02/03/2021 2051   PHURINE 6.0 02/03/2021 2051   GLUCOSEU NEGATIVE 02/03/2021 2051   HGBUR SMALL (A) 02/03/2021 2051   BILIRUBINUR NEGATIVE 02/03/2021 2051   [place] NEGATIVE 02/03/2021 2051   PROTEINUR 30 (A) 02/03/2021 2051   NITRITE NEGATIVE 02/03/2021 2051   LEUKOCYTESUR NEGATIVE 02/03/2021 2051   Sepsis Labs Invalid input(s): PROCALCITONIN,  WBC,  LACTICIDVEN Microbiology Recent Results (from the past 240 hour(s))  Blood culture (routine x 2)     Status: None   Collection Time: 02/03/21  4:57 PM   Specimen: BLOOD  Result Value Ref Range Status   Specimen Description BLOOD LEFT ANTECUBITAL  Final   Special Requests   Final    BOTTLES DRAWN AEROBIC AND ANAEROBIC Blood Culture adequate volume   Culture   Final    NO GROWTH 5 DAYS Performed at [facility], [place] [address] [place], [place] [address]    Report Status 02/08/2021 FINAL  Final  Urine culture     Status: Abnormal   Collection Time: 02/03/21  7:39 PM   Specimen: In/Out Cath Urine  Result Value Ref Range Status   Specimen Description IN/OUT  CATH URINE  Final   Special Requests   Final    NONE Performed at [facility], [place] [address], [place], [place] [address]    Culture 800 COLONIES/mL STAPHYLOCOCCUS EPIDERMIDIS (A)  Final   Report Status 02/06/2021  FINAL  Final   Organism ID, Bacteria STAPHYLOCOCCUS EPIDERMIDIS (A)  Final      Susceptibility   Staphylococcus epidermidis - MIC*    CIPROFLOXACIN <=0.5 SENSITIVE Sensitive     GENTAMICIN <=0.5 SENSITIVE Sensitive     NITROFURANTOIN <=16 SENSITIVE Sensitive     OXACILLIN <=0.25 SENSITIVE Sensitive     TETRACYCLINE <=1 SENSITIVE Sensitive     VANCOMYCIN <=0.5 SENSITIVE Sensitive     TRIMETH/SULFA <=10 SENSITIVE Sensitive     CLINDAMYCIN <=0.25 SENSITIVE Sensitive     RIFAMPIN <=0.5 SENSITIVE Sensitive     Inducible Clindamycin NEGATIVE Sensitive     * 800 COLONIES/mL STAPHYLOCOCCUS EPIDERMIDIS  Blood culture (routine x 2)     Status: None   Collection Time: 02/03/21  8:10 PM   Specimen: BLOOD  Result Value Ref Range Status   Specimen Description BLOOD  Final   Special Requests NONE  Final   Culture   Final    NO GROWTH 5 DAYS Performed at [facility], [place] [address], [place], [place] [address]    Report Status 02/08/2021 FINAL  Final  Resp Panel by RT-PCR (Flu A&B, Covid) Nasopharyngeal Swab     Status: None   Collection Time: 02/03/21  9:20 PM   Specimen: Nasopharyngeal Swab; Nasopharyngeal(NP) swabs in vial transport medium  Result Value Ref Range Status   SARS Coronavirus 2 by RT PCR NEGATIVE NEGATIVE Final    Comment: (NOTE) SARS-CoV-2 target nucleic acids are NOT DETECTED.  The SARS-CoV-2 RNA is generally detectable in upper respiratory specimens during the acute phase of infection. The lowest concentration of SARS-CoV-2 viral copies this assay can detect is 138 copies/mL. A negative result does not preclude SARS-Cov-2 infection and should not be used as the sole basis for treatment or other patient management decisions. A negative result may occur with  improper specimen collection/handling, submission of specimen other than nasopharyngeal swab, presence of viral mutation(s) within the areas targeted by this assay, and inadequate number of viral copies(<138  copies/mL). A negative result must be combined with clinical observations, patient history, and epidemiological information. The expected result is Negative.  Fact Sheet for Patients:  EntrepreneurPulse.com.au  Fact Sheet for Healthcare Providers:  IncredibleEmployment.be  This test is no t yet approved or cleared by the [place] FDA and  has been authorized for detection and/or diagnosis of SARS-CoV-2 by FDA under an Emergency Use Authorization (EUA). This EUA will remain  in effect (meaning this test can be used) for the duration of the COVID-19 declaration under Section 564(b)(1) of the Act, 21 U.S.C.section 360bbb-3(b)(1), unless the authorization is terminated  or revoked sooner.       Influenza A by PCR NEGATIVE NEGATIVE Final   Influenza B by PCR NEGATIVE NEGATIVE Final    Comment: (NOTE) The Xpert Xpress SARS-CoV-2/FLU/RSV plus assay is intended as an aid in the diagnosis of influenza from Nasopharyngeal swab specimens and should not be used as a sole basis for treatment. Nasal washings and aspirates are unacceptable for Xpert Xpress SARS-CoV-2/FLU/RSV testing.  Fact Sheet for Patients: EntrepreneurPulse.com.au  Fact Sheet for Healthcare Providers: IncredibleEmployment.be  This test is not yet approved or cleared by the [place] FDA and has been authorized for detection  and/or diagnosis of SARS-CoV-2 by FDA under an Emergency Use Authorization (EUA). This EUA will remain in effect (meaning this test can be used) for the duration of the COVID-19 declaration under Section 564(b)(1) of the Act, 21 U.S.C. section 360bbb-3(b)(1), unless the authorization is terminated or revoked.  Performed at [facility], [place] [address], [place], [place] [address]   Respiratory (~20 pathogens) panel by PCR     Status: None   Collection Time: 02/04/21  5:44 AM   Specimen: Nasopharyngeal Swab;  Respiratory  Result Value Ref Range Status   Adenovirus NOT DETECTED NOT DETECTED Final   Coronavirus 229E NOT DETECTED NOT DETECTED Final    Comment: (NOTE) The Coronavirus on the Respiratory Panel, DOES NOT test for the novel  Coronavirus (2019 nCoV)    Coronavirus HKU1 NOT DETECTED NOT DETECTED Final   Coronavirus NL63 NOT DETECTED NOT DETECTED Final   Coronavirus OC43 NOT DETECTED NOT DETECTED Final   Metapneumovirus NOT DETECTED NOT DETECTED Final   Rhinovirus / Enterovirus NOT DETECTED NOT DETECTED Final   Influenza A NOT DETECTED NOT DETECTED Final   Influenza B NOT DETECTED NOT DETECTED Final   Parainfluenza Virus 1 NOT DETECTED NOT DETECTED Final   Parainfluenza Virus 2 NOT DETECTED NOT DETECTED Final   Parainfluenza Virus 3 NOT DETECTED NOT DETECTED Final   Parainfluenza Virus 4 NOT DETECTED NOT DETECTED Final   Respiratory Syncytial Virus NOT DETECTED NOT DETECTED Final   Bordetella pertussis NOT DETECTED NOT DETECTED Final   Bordetella Parapertussis NOT DETECTED NOT DETECTED Final   Chlamydophila pneumoniae NOT DETECTED NOT DETECTED Final   Mycoplasma pneumoniae NOT DETECTED NOT DETECTED Final    Comment: Performed at [facility], [address] [place], [place] [address]     Time coordinating discharge: 36 minutes.   SIGNED:   Hosie Poisson, MD  [facility] 02/08/2021, 5:41 PM

## 2021-02-08 NOTE — Discharge Instructions (Addendum)

## 2021-02-08 NOTE — Plan of Care (Signed)
  Problem: Education: Goal: Knowledge of General Education information will improve Description: Including pain rating scale, medication(s)/side effects and non-pharmacologic comfort measures Outcome: Adequate for Discharge   Problem: Health Behavior/Discharge Planning: Goal: Ability to manage health-related needs will improve Outcome: Adequate for Discharge   Problem: Clinical Measurements: Goal: Ability to maintain clinical measurements within normal limits will improve Outcome: Adequate for Discharge Goal: Will remain free from infection Outcome: Adequate for Discharge Goal: Diagnostic test results will improve Outcome: Adequate for Discharge Goal: Respiratory complications will improve Outcome: Adequate for Discharge Goal: Cardiovascular complication will be avoided Outcome: Adequate for Discharge   Problem: Activity: Goal: Risk for activity intolerance will decrease Outcome: Adequate for Discharge   Problem: Nutrition: Goal: Adequate nutrition will be maintained Outcome: Adequate for Discharge   Problem: Coping: Goal: Level of anxiety will decrease Outcome: Adequate for Discharge   Problem: Elimination: Goal: Will not experience complications related to bowel motility Outcome: Adequate for Discharge Goal: Will not experience complications related to urinary retention Outcome: Adequate for Discharge   Problem: Pain Managment: Goal: General experience of comfort will improve Outcome: Adequate for Discharge   Problem: Safety: Goal: Ability to remain free from injury will improve Outcome: Adequate for Discharge   Problem: Skin Integrity: Goal: Risk for impaired skin integrity will decrease Outcome: Adequate for Discharge   Problem: Activity: Goal: Ability to tolerate increased activity will improve Outcome: Adequate for Discharge   Problem: Respiratory: Goal: Ability to maintain adequate ventilation will improve Outcome: Adequate for Discharge Goal: Ability  to maintain a clear airway will improve Outcome: Adequate for Discharge   Problem: Education: Goal: Understanding of CV disease, CV risk reduction, and recovery process will improve Outcome: Adequate for Discharge Goal: Individualized Educational Video(s) Outcome: Adequate for Discharge   Problem: Activity: Goal: Ability to return to baseline activity level will improve Outcome: Adequate for Discharge   Problem: Cardiovascular: Goal: Ability to achieve and maintain adequate cardiovascular perfusion will improve Outcome: Adequate for Discharge Goal: Vascular access site(s) Level 0-1 will be maintained Outcome: Adequate for Discharge   Problem: Health Behavior/Discharge Planning: Goal: Ability to safely manage health-related needs after discharge will improve Outcome: Adequate for Discharge

## 2021-02-09 LAB — MISC LABCORP TEST (SEND OUT): Labcorp test code: 164226

## 2021-02-09 LAB — B. BURGDORFI ANTIBODIES

## 2021-02-10 ENCOUNTER — Other Ambulatory Visit: Payer: 59

## 2021-02-18 ENCOUNTER — Other Ambulatory Visit: Payer: Self-pay

## 2021-02-18 ENCOUNTER — Ambulatory Visit
Admission: RE | Admit: 2021-02-18 | Discharge: 2021-02-18 | Disposition: A | Discharge status: Home / Self Care | Payer: 59 | Source: Ambulatory Visit | Attending: Sports Medicine | Admitting: Sports Medicine

## 2021-02-18 DIAGNOSIS — M25511 Pain in right shoulder: Secondary | ICD-10-CM | POA: Diagnosis not present

## 2021-02-18 LAB — ROCKY MTN SPOTTED FVR ABS PNL(IGG+IGM)
RMSF IgG: NEGATIVE
RMSF IgM: 0.53 index (ref 0.00–0.89)

## 2021-02-24 ENCOUNTER — Ambulatory Visit: Payer: 59 | Admitting: Vascular Surgery

## 2021-02-24 DIAGNOSIS — M25511 Pain in right shoulder: Secondary | ICD-10-CM | POA: Diagnosis not present

## 2021-02-27 ENCOUNTER — Telehealth: Payer: Self-pay | Admitting: *Deleted

## 2021-02-27 NOTE — Telephone Encounter (Signed)
S/w both the pt and his wife and have stated that Dr. Stanford Breed has cleared him for his upcoming surgery. Both the pt and his wife are very grateful for the call and the help that our office and Dr. Stanford Breed has give. Pt aware clearance notes have been faxed to the surgeon's office. Aware the appt scheduled for 7/7 has been cancelled.

## 2021-02-27 NOTE — Telephone Encounter (Signed)
   Name: [name]  DOB: Jun 03, 1956  MRN: [id number]  Primary Cardiologist: None  Chart reviewed as part of pre-operative protocol coverage. Patient has never been seen by [place] in the outpatient setting. He was recently  admitted from 02/03/2021 to 02/08/2021 for NSTEMI and sepsis secondary to pneumonia. High-sensitivity troponin peaked at 3,228. He underwent cardiac catheterization which showed single vessel occlusive CAD with CTO of the mid RCA with collaterals. Otherwise, non-obstructive CAD. Medical therapy was recommended.  Because of [name]'s past medical history and time since last visit, he will require a follow-up visit in order to better assess preoperative cardiovascular risk.  Pre-op covering staff: - Please schedule appointment and call patient to inform them. If patient already had an upcoming appointment within acceptable timeframe, please add "pre-op clearance" to the appointment notes so provider is aware. - Please contact requesting surgeon's office via preferred method (i.e, phone, fax) to inform them of need for appointment prior to surgery.  Dr. Stanford Breed, can you please give your recommendations for holding Aspirin prior to this procedure. I reviewed your last note during his recent admission and you also recommended Plavix but it does not look like he was discharged on this. Would you still like him to be on Plavix as well. Also, given he just had a NSTEMI, would you recommend he wait to have this shoulder surgery or OK to proceed as long as he is doing OK at follow-up visit. Please route response back to [facility] PREOP.  Thank you!  Darreld Mclean, PA-C  02/27/2021, 2:07 PM

## 2021-02-27 NOTE — Telephone Encounter (Signed)
   [place] Pre-operative Risk Assessment    Patient Name: [name]  DOB: 1956-04-06  MRN: [id number]   HEARTCARE STAFF: - Please ensure there is not already an duplicate clearance open for this procedure. - Under Visit Info/Reason for Call, type in Other and utilize the format Clearance MM/DD/YY or Clearance TBD. Do not use dashes or single digits. - If request is for dental extraction, please clarify the # of teeth to be extracted. - If the patient is currently at the dentist's office, call Pre-Op APP to address. If the patient is not currently in the dentist office, please route to the Pre-Op pool  Request for surgical clearance:  What type of surgery is being performed? RIGHT SHOULDER SCOPE w/RCR   When is this surgery scheduled? TBD   What type of clearance is required (medical clearance vs. Pharmacy clearance to hold med vs. Both)? MEDICAL  Are there any medications that need to be held prior to surgery and how long? ASA    Practice name and name of physician performing surgery? [facility]; DR. Lennette Bihari SUPPLE   What is the office phone number? [phone]   7.   What is the office fax number? [phone] ATTN: [place]  8.   Anesthesia type (None, local, MAC, general) ? GENERAL   Julaine Hua 02/27/2021, 11:06 AM  _________________________________________________________________   (provider comments below)

## 2021-02-27 NOTE — Telephone Encounter (Signed)
Thank you for the quick response. Just clarifying...do you feel like we need to see him in the office before surgery or is it OK for [place] to go ahead and clear him?  Thank you!

## 2021-02-27 NOTE — Telephone Encounter (Signed)
    TREXTON ESCAMILLA DOB:  [birth year]  MRN:  [id number]   Primary Cardiologist: Dr. Stanford Breed  Chart reviewed as part of pre-operative protocol coverage. Patient recently admitted from 02/03/2021 to 02/08/2021 for NSTEMI and sepsis secondary to pneumonia. Cardiac catheterization showed single vessel occlusive CAD with CTO of the mid RCA with collaterals. Otherwise, non-obstructive CAD. Medical therapy was recommended. Echo showed LVEF of 60-65% with normal wall motion. Discussed with Dr. Stanford Breed who stated patient is OK to proceed with surgery. Also OK to hold Aspirin as needed. Please restart Aspirin as soon as able postoperatively.   I will route this recommendation to the requesting party via Epic fax function and remove from pre-op pool.  Please call with questions.  Darreld Mclean, PA-C 02/27/2021, 4:09 PM

## 2021-03-09 ENCOUNTER — Other Ambulatory Visit: Payer: Self-pay

## 2021-03-09 ENCOUNTER — Encounter (HOSPITAL_COMMUNITY): Payer: Self-pay

## 2021-03-09 ENCOUNTER — Encounter (HOSPITAL_COMMUNITY)
Admission: RE | Admit: 2021-03-09 | Discharge: 2021-03-09 | Disposition: A | Discharge status: Home / Self Care | Payer: 59 | Source: Ambulatory Visit | Attending: Orthopedic Surgery | Admitting: Orthopedic Surgery

## 2021-03-09 DIAGNOSIS — Z01818 Encounter for other preprocedural examination: Secondary | ICD-10-CM | POA: Diagnosis not present

## 2021-03-09 HISTORY — DX: Atherosclerotic heart disease of native coronary artery without angina pectoris: I25.10

## 2021-03-09 HISTORY — DX: Unspecified osteoarthritis, unspecified site: M19.90

## 2021-03-09 NOTE — Progress Notes (Signed)
Anesthesia Chart Review   Case: [id number] Date/Time: 03/14/21 1230   Procedure: Right shoulder arthroscopy, subacromial decompression, distal clavicle resection, rotator cuff repair (Right) - 154min   Anesthesia type: General   Pre-op diagnosis: right shoulder impingement, acromioclavicular osteoarthritis, rotator cuff tear   Location: [facility] ROOM 02 / [facility]   Surgeons: Justice Britain, MD       DISCUSSION:65 y.o. former smoker with h/o HTN, CAD, moderate AS (Aortic valve mean gradient measures 25.8 mmHg, Aortic valve peak gradient measures 45.2 mmHg, Aortic valve area, by VTI measures 1.28 cm on Echo 02/04/2021), right shoulder OA, rotator cuff tear scheduled for above procedure 03/14/2021 with Dr. Justice Britain.   Pt was cleared by cardiology.  Per preoperative evaluation 02/27/2021, "Chart reviewed as part of pre-operative protocol coverage. Patient recently admitted from 02/03/2021 to 02/08/2021 for NSTEMI and sepsis secondary to pneumonia. Cardiac catheterization showed single vessel occlusive CAD with CTO of the mid RCA with collaterals. Otherwise, non-obstructive CAD. Medical therapy was recommended. Echo showed LVEF of 60-65% with normal wall motion. Discussed with Dr. Stanford Breed who stated patient is OK to proceed with surgery. Also OK to hold Aspirin as needed. Please restart Aspirin as soon as able postoperatively. "  Anticipate pt can proceed with planned procedure barring acute status change.   VS: Ht 5\' 11"  (1.803 m)   Wt 111.1 kg   BMI 34.17 kg/m   PROVIDERS: Orpah Melter, MD is PCP   [name], MD is Cardiologist  LABS: Labs reviewed: Acceptable for surgery. (all labs ordered are listed, but only abnormal results are displayed)  Labs Reviewed - No data to display   IMAGES:   EKG: 02/07/2021 Rate 98 bpm  NSR  CV: Cardiac Cath 02/07/2021  Prox Cx lesion is 25% stenosed. 1st Diag lesion is 50% stenosed. 2nd Diag lesion is 30% stenosed. Prox RCA to Dist RCA lesion is  100% stenosed. LV end diastolic pressure is normal. There is moderate aortic valve stenosis. Ost RCA to Prox RCA lesion is 75% stenosed.   1. 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 2. Normal LVEDP 3. Moderate Aortic stenosis with approximately 30 mm Hg gradient across the valve.   Plan: recommend medical management. The RCA does not appear suitable for CTO PCI.  Echo 02/04/2021 IMPRESSIONS     1. Left ventricular ejection fraction, by estimation, is 60 to 65%. The  left ventricle has normal function. The left ventricle has no regional  wall motion abnormalities. Left ventricular diastolic parameters are  consistent with Grade I diastolic  dysfunction (impaired relaxation). Elevated left atrial pressure.   2. Right ventricular systolic function is normal. The right ventricular  size is normal.   3. The mitral valve is normal in structure. No evidence of mitral valve  regurgitation. No evidence of mitral stenosis.   4. The aortic valve has an indeterminant number of cusps. Aortic valve  regurgitation is not visualized. Moderate aortic valve stenosis.   5. The inferior vena cava is normal in size with greater than 50%  respiratory variability, suggesting right atrial pressure of 3 mmHg. Past Medical History:  Diagnosis Date   Arthritis    hands   BMI 34.0-34.9,adult    Coronary artery disease    Dyspnea 02/07/2021   Headache    HTN (hypertension)    Hyperlipidemia    Pneumonia 02/07/2021    Past Surgical History:  Procedure Laterality Date   CYST REMOVAL HAND Right 2018  gallbladder removed  2008   LEFT HEART CATH AND CORONARY ANGIOGRAPHY N/A 02/07/2021   Procedure: LEFT HEART CATH AND CORONARY ANGIOGRAPHY;  Surgeon: Martinique, Peter M, MD;  Location: [facility];  Service: Cardiovascular;  Laterality: N/A;    MEDICATIONS:  amlodipine-olmesartan (AZOR) 10-20 MG tablet   aspirin 81 MG chewable tablet   benzonatate  (TESSALON) 100 MG capsule   calcium carbonate (TUMS - DOSED IN MG ELEMENTAL CALCIUM) 500 MG chewable tablet   docusate sodium (COLACE) 100 MG capsule   famotidine (PEPCID) 20 MG tablet   guaiFENesin-dextromethorphan (ROBITUSSIN DM) 100-10 MG/5ML syrup   metoprolol tartrate (LOPRESSOR) 25 MG tablet   nicotine (NICODERM CQ - DOSED IN MG/24 HOURS) 21 mg/24hr patch   nitroGLYCERIN (NITROSTAT) 0.4 MG SL tablet   polyethylene glycol (MIRALAX / GLYCOLAX) 17 g packet   rosuvastatin (CRESTOR) 40 MG tablet   No current facility-administered medications for this encounter.    Konrad Felix, PA-C [facility] [phone]

## 2021-03-09 NOTE — Anesthesia Preprocedure Evaluation (Addendum)
Anesthesia Evaluation  Patient identified by MRN, date of birth, ID band Patient awake    Reviewed: Allergy & Precautions, NPO status , Patient's Chart, lab work & pertinent test results, reviewed documented beta blocker date and time   Airway Mallampati: II  TM Distance: >3 FB Neck ROM: Full    Dental  (+) Missing, Dental Advisory Given,    Pulmonary shortness of breath, former smoker,  Recent former smoker   Pulmonary exam normal breath sounds clear to auscultation       Cardiovascular hypertension, Pt. on home beta blockers and Pt. on medications + CAD and + Past MI  + Valvular Problems/Murmurs (mod AS) AS  Rhythm:Regular Rate:Normal + Systolic murmurs EF 42%. The left ventricle has normal function. The left ventricle has no  regional wall motion abnormalities. The left ventricular internal cavity  size was normal in size. There is  no left ventricular hypertrophy. Left ventricular diastolic parameters  are consistent with Grade I diastolic dysfunction (impaired relaxation).  Elevated left atrial pressure.   Right Ventricle: The right ventricular size is normal. Right ventricular  systolic function is normal.   Left Atrium: Left atrial size was normal in size.   Right Atrium: Right atrial size was normal in size.   Pericardium: There is no evidence of pericardial effusion.   Mitral Valve: The mitral valve is normal in structure. Mild mitral annular  calcification. No evidence of mitral valve regurgitation. No evidence of  mitral valve stenosis.   Tricuspid Valve: The tricuspid valve is normal in structure. Tricuspid  valve regurgitation is trivial. No evidence of tricuspid stenosis.   Aortic Valve: The aortic valve has an indeterminant number of cusps.  Aortic valve regurgitation is not visualized. Moderate aortic stenosis is  present. Aortic valve mean gradient measures 25.8 mmHg. Aortic valve peak  gradient  measures 45.2 mmHg. Aortic  valve area, by VTI measures 1.28 cm.    Neuro/Psych  Headaches, negative psych ROS   GI/Hepatic negative GI ROS, Neg liver ROS,   Endo/Other  negative endocrine ROS  Renal/GU negative Renal ROS  negative genitourinary   Musculoskeletal  (+) Arthritis ,   Abdominal   Peds negative pediatric ROS (+)  Hematology negative hematology ROS (+)   Anesthesia Other Findings   Reproductive/Obstetrics negative OB ROS                          Anesthesia Physical Anesthesia Plan  ASA: 3  Anesthesia Plan: General and Regional   Post-op Pain Management:  Regional for Post-op pain   Induction: Intravenous  PONV Risk Score and Plan: 2 and Dexamethasone, Ondansetron, Treatment may vary due to age or medical condition and Midazolam  Airway Management Planned: Oral ETT  Additional Equipment:   Intra-op Plan:   Post-operative Plan: Extubation in OR  Informed Consent: I have reviewed the patients History and Physical, chart, labs and discussed the procedure including the risks, benefits and alternatives for the proposed anesthesia with the patient or authorized representative who has indicated his/her understanding and acceptance.     Dental advisory given  Plan Discussed with: CRNA and Surgeon  Anesthesia Plan Comments:       Anesthesia Quick Evaluation

## 2021-03-09 NOTE — Patient Instructions (Addendum)
DUE TO COVID-19 ONLY ONE VISITOR IS ALLOWED TO COME WITH YOU AND STAY IN THE WAITING ROOM ONLY DURING PRE OP AND PROCEDURE DAY OF SURGERY. THE 1 VISITOR  MAY VISIT WITH YOU AFTER SURGERY IN YOUR PRIVATE ROOM DURING VISITING HOURS ONLY!                 [name]    Your procedure is scheduled on: 03/14/21   Report to [facility] Main  Entrance   Report to admitting at  10:45 AM     Call this number if you have problems the morning of surgery [place], NO CHEWING GUM [place].   No food after midnight.    You may have clear liquid until 4:30 AM.    CLEAR LIQUID DIET   Foods Allowed                                                                     Foods Excluded  Coffee and tea, regular and decaf                             liquids that you cannot  Plain Jell-O any favor except red or purple                                           see through such as: Fruit ices (not with fruit pulp)                                     milk, soups, orange juice  Iced Popsicles                                    All solid food Carbonated beverages, regular and diet                                    Cranberry, grape and apple juices Sports drinks like Gatorade Lightly seasoned clear broth or consume(fat free) Sugar, honey syrup   Drink the ensure between 4:00 and 4:30 am   . Nothing by mouth after 4:30 AM.    Take these medicines the morning of surgery with A SIP OF WATER: Metoprolol,                                You may not have any metal on your body including              piercings  Do not wear jewelry, lotions, powders or deodorant                          Men may shave face and neck.   Do not bring  valuables to the hospital. [place].  Contacts, dentures or bridgework may not be worn into surgery.       Patients discharged the day of  surgery will not be allowed to drive home.   IF YOU ARE HAVING SURGERY AND GOING HOME THE SAME DAY, YOU MUST HAVE AN ADULT TO DRIVE YOU HOME AND BE WITH YOU FOR 24 HOURS.   YOU MAY GO HOME BY TAXI OR UBER OR ORTHERWISE, BUT AN ADULT MUST ACCOMPANY YOU HOME AND STAY WITH YOU FOR 24 HOURS.  Name and phone number of your driver:  Special Instructions: N/A              Please read over the following fact sheets you were given: _____________________________________________________________________             [facility]- Preparing for Total Shoulder Arthroplasty    Before surgery, you can play an important role. Because skin is not sterile, your skin needs to be as free of germs as possible. You can reduce the number of germs on your skin by using the following products. Benzoyl Peroxide Gel Reduces the number of germs present on the skin Applied twice a day to shoulder area starting two days before surgery    ==================================================================  Please follow these instructions carefully:  BENZOYL PEROXIDE 5% GEL  Please do not use if you have an allergy to benzoyl peroxide.   If your skin becomes reddened/irritated stop using the benzoyl peroxide.  Starting two days before surgery, apply as follows: Apply benzoyl peroxide in the morning and at night. Apply after taking a shower. If you are not taking a shower clean entire shoulder front, back, and side along with the armpit with a clean wet washcloth.  Place a quarter-sized dollop on your shoulder and rub in thoroughly, making sure to cover the front, back, and side of your shoulder, along with the armpit.   (7/3 )2 days before ____ AM   ____ PM    and     (7/4)     1 day before ____ AM   ____ PM                         Do this twice a day for two days.  (Last application is the night before surgery, AFTER using the CHG soap as described below).  Do NOT apply benzoyl peroxide gel on the day of surgery.    [place] - Preparing for Surgery Before surgery, you can play an important role.  Because skin is not sterile, your skin needs to be as free of germs as possible.  You can reduce the number of germs on your skin by washing with CHG (chlorahexidine gluconate) soap before surgery.  CHG is an antiseptic cleaner which kills germs and bonds with the skin to continue killing germs even after washing. Please DO NOT use if you have an allergy to CHG or antibacterial soaps.  If your skin becomes reddened/irritated stop using the CHG and inform your nurse when you arrive at Short Stay.   You may shave your face/neck.  Please follow these instructions carefully:  1.  Shower with CHG Soap the night before surgery and the  morning of Surgery.  2.  If you choose to wash your hair, wash your hair first as usual with your  normal  shampoo.  3.  After you shampoo, rinse your hair and body thoroughly to remove the  shampoo.                                  4.  Use CHG as you would any other liquid soap.  You can apply chg directly  to the skin and wash                       Gently with a scrungie or clean washcloth.  5.  Apply the CHG Soap to your body ONLY FROM THE NECK DOWN.   Do not use on face/ open                           Wound or open sores. Avoid contact with eyes, ears mouth and genitals (private parts).                       Wash face,  Genitals (private parts) with your normal soap.       6.  Wash thoroughly, paying special attention to the area where your surgery  will be performed.  7.  Thoroughly rinse your body with warm water from the neck down.  8.  DO NOT shower/wash with your normal soap after using and rinsing off  the CHG Soap.       9.  Pat yourself dry with a clean towel.       10.  Wear clean pajamas.       11.  Place clean sheets on your bed the night of your first shower and do not  sleep with pets. Day of Surgery : Do not apply any lotions/deodorants the morning of surgery.   Please wear clean clothes to the hospital/surgery center.  FAILURE TO FOLLOW THESE INSTRUCTIONS MAY RESULT IN THE CANCELLATION OF YOUR SURGERY   Incentive Spirometer - ask for it at post op  An incentive spirometer is a tool that can help keep your lungs clear and active. This tool measures how well you are filling your lungs with each breath. Taking long deep breaths may help reverse or decrease the chance of developing breathing (pulmonary) problems (especially infection) following: A long period of time when you are unable to move or be active. BEFORE THE PROCEDURE  If the spirometer includes an indicator to show your best effort, your nurse or respiratory therapist will set it to a desired goal. If possible, sit up straight or lean slightly forward. Try not to slouch. Hold the incentive spirometer in an upright position. INSTRUCTIONS FOR USE  Sit on the edge of your bed if possible, or sit up as far as you can in bed or on a chair. Hold the incentive spirometer in an upright position. Breathe out normally. Place the mouthpiece in your mouth and seal your lips tightly around it. Breathe in slowly and as deeply as possible, raising the piston or the ball toward the top of the column. Hold your breath for 3-5 seconds or for as long as possible. Allow the piston or ball to fall to the bottom of the column. Remove the mouthpiece from your mouth and breathe out normally. Rest for a few seconds and repeat Steps 1 through 7 at least 10 times every 1-2 hours when you are awake. Take your time and take a few  normal breaths between deep breaths. The spirometer may include an indicator to show your best effort. Use the indicator as a goal to work toward during each repetition. After each set of 10 deep breaths, practice coughing to be sure your lungs are clear. If you have an incision (the cut made at the time of surgery), support your incision when coughing by placing a pillow or rolled up towels  firmly against it. Once you are able to get out of bed, walk around indoors and cough well. You may stop using the incentive spirometer when instructed by your caregiver.  RISKS AND COMPLICATIONS Take your time so you do not get dizzy or light-headed. If you are in pain, you may need to take or ask for pain medication before doing incentive spirometry. It is harder to take a deep breath if you are having pain. AFTER USE Rest and breathe slowly and easily. It can be helpful to keep track of a log of your progress. Your caregiver can provide you with a simple table to help with this. If you are using the spirometer at home, follow these instructions: [place] IF:  You are having difficultly using the spirometer. You have trouble using the spirometer as often as instructed. Your pain medication is not giving enough relief while using the spirometer. You develop fever of 100.5 F (38.1 C) or higher. SEEK IMMEDIATE MEDICAL CARE IF:  You cough up bloody sputum that had not been present before. You develop fever of 102 F (38.9 C) or greater. You develop worsening pain at or near the incision site. MAKE SURE YOU:  Understand these instructions. Will watch your condition. Will get help right away if you are not doing well or get worse. Document Released: 01/07/2007 Document Revised: 11/19/2011 Document Reviewed: 03/10/2007 [facility] Patient Information 2014 [place], [place].   ________________________________________________________________________

## 2021-03-09 NOTE — Progress Notes (Signed)
COVID Vaccine Completed:Yes Date COVID Vaccine completed:12/29/19 COVID vaccine manufacturer: Pfizer     PCP - Dr. Anne Shutter LOV 02/28/21 Cardiologist - Dr. Stanford Breed .Pt will have a F/U with him 08/10/21 form his cardiac cath done on 02/07/21.  Chest x-ray - 02/04/21-epic EKG - 02/07/21-epic Stress Test - no ECHO - no Cardiac Cath - 02/07/21-epic Pacemaker/ICD device last checked:NA  Sleep Study - no CPAP -   Fasting Blood Sugar - NA Checks Blood Sugar _____ times a day  Blood Thinner Instructions:ASA 81/ Dr. Olen Pel Aspirin Instructions:stop 5 days prior to DOS/ Dr. Onnie Graham Last Dose:03/08/21  Anesthesia review: yes  Patient denies shortness of breath, fever, cough and chest pain at PAT appointment Pt quit smoking 02/02/21 after his hospital stay 5/27-6/1 for cardiac issues and pneumonia. He reports that he dosn't have SOB now.  Patient verbalized understanding of instructions that were given to them at the PAT appointment. Patient was also instructed that they will need to review over the PAT instructions again at home before surgery. Yes  Dr. Onnie Graham has the clearances for surgery from his Drs.

## 2021-03-10 DIAGNOSIS — Z4889 Encounter for other specified surgical aftercare: Secondary | ICD-10-CM | POA: Diagnosis not present

## 2021-03-10 DIAGNOSIS — M75101 Unspecified rotator cuff tear or rupture of right shoulder, not specified as traumatic: Secondary | ICD-10-CM | POA: Diagnosis not present

## 2021-03-10 DIAGNOSIS — M25511 Pain in right shoulder: Secondary | ICD-10-CM | POA: Diagnosis not present

## 2021-03-10 DIAGNOSIS — I890 Lymphedema, not elsewhere classified: Secondary | ICD-10-CM | POA: Diagnosis not present

## 2021-03-10 DIAGNOSIS — M7541 Impingement syndrome of right shoulder: Secondary | ICD-10-CM | POA: Diagnosis not present

## 2021-03-10 NOTE — Progress Notes (Signed)
Pt aware to arrive at [facility] admitting at 1015 instead of 1045 for scheduled surgical procedure for Tuesday 03/14/2021

## 2021-03-14 ENCOUNTER — Ambulatory Visit (HOSPITAL_COMMUNITY): Payer: 59 | Admitting: Certified Registered"

## 2021-03-14 ENCOUNTER — Ambulatory Visit (HOSPITAL_COMMUNITY): Payer: 59 | Admitting: Physician Assistant

## 2021-03-14 ENCOUNTER — Ambulatory Visit (HOSPITAL_COMMUNITY)
Admission: RE | Admit: 2021-03-14 | Discharge: 2021-03-14 | Disposition: A | Discharge status: Home / Self Care | Payer: 59 | Source: Ambulatory Visit | Attending: Orthopedic Surgery | Admitting: Orthopedic Surgery

## 2021-03-14 ENCOUNTER — Encounter (HOSPITAL_COMMUNITY): Payer: Self-pay | Admitting: Orthopedic Surgery

## 2021-03-14 ENCOUNTER — Encounter (HOSPITAL_COMMUNITY)
Admission: RE | Disposition: A | Discharge status: Home / Self Care | Payer: Self-pay | Source: Ambulatory Visit | Attending: Orthopedic Surgery

## 2021-03-14 DIAGNOSIS — Z87891 Personal history of nicotine dependence: Secondary | ICD-10-CM | POA: Diagnosis not present

## 2021-03-14 DIAGNOSIS — M7521 Bicipital tendinitis, right shoulder: Secondary | ICD-10-CM | POA: Diagnosis not present

## 2021-03-14 DIAGNOSIS — Z79899 Other long term (current) drug therapy: Secondary | ICD-10-CM | POA: Diagnosis not present

## 2021-03-14 DIAGNOSIS — M75111 Incomplete rotator cuff tear or rupture of right shoulder, not specified as traumatic: Secondary | ICD-10-CM | POA: Diagnosis not present

## 2021-03-14 DIAGNOSIS — M25811 Other specified joint disorders, right shoulder: Secondary | ICD-10-CM | POA: Diagnosis not present

## 2021-03-14 DIAGNOSIS — M75101 Unspecified rotator cuff tear or rupture of right shoulder, not specified as traumatic: Secondary | ICD-10-CM | POA: Diagnosis not present

## 2021-03-14 DIAGNOSIS — M19011 Primary osteoarthritis, right shoulder: Secondary | ICD-10-CM | POA: Diagnosis not present

## 2021-03-14 DIAGNOSIS — Z7982 Long term (current) use of aspirin: Secondary | ICD-10-CM | POA: Diagnosis not present

## 2021-03-14 DIAGNOSIS — G8918 Other acute postprocedural pain: Secondary | ICD-10-CM | POA: Diagnosis not present

## 2021-03-14 DIAGNOSIS — Z9049 Acquired absence of other specified parts of digestive tract: Secondary | ICD-10-CM | POA: Diagnosis not present

## 2021-03-14 DIAGNOSIS — M7541 Impingement syndrome of right shoulder: Secondary | ICD-10-CM | POA: Diagnosis not present

## 2021-03-14 HISTORY — PX: SHOULDER ARTHROSCOPY WITH ROTATOR CUFF REPAIR: SHX5685

## 2021-03-14 SURGERY — ARTHROSCOPY, SHOULDER, WITH ROTATOR CUFF REPAIR
Anesthesia: General | Site: Shoulder | Laterality: Right

## 2021-03-14 MED ORDER — PROPOFOL 10 MG/ML IV BOLUS
INTRAVENOUS | Status: DC | PRN
  Administered 2021-03-14: 100 mg via INTRAVENOUS

## 2021-03-14 MED ORDER — PHENYLEPHRINE 40 MCG/ML (10ML) SYRINGE FOR IV PUSH (FOR BLOOD PRESSURE SUPPORT)
PREFILLED_SYRINGE | INTRAVENOUS | Status: AC
  Filled 2021-03-14: qty 10

## 2021-03-14 MED ORDER — BUPIVACAINE HCL (PF) 0.5 % IJ SOLN
INTRAMUSCULAR | Status: DC | PRN
  Administered 2021-03-14: 20 mL via PERINEURAL

## 2021-03-14 MED ORDER — HYDROMORPHONE HCL 1 MG/ML IJ SOLN
0.2500 mg | INTRAMUSCULAR | Status: DC | PRN
  Administered 2021-03-14: 0.5 mg via INTRAVENOUS

## 2021-03-14 MED ORDER — EPHEDRINE SULFATE-NACL 50-0.9 MG/10ML-% IV SOSY
PREFILLED_SYRINGE | INTRAVENOUS | Status: DC | PRN
Start: 2021-03-14 — End: 2021-03-14
  Administered 2021-03-14: 10 mg via INTRAVENOUS

## 2021-03-14 MED ORDER — SUGAMMADEX SODIUM 200 MG/2ML IV SOLN
INTRAVENOUS | Status: DC | PRN
  Administered 2021-03-14: 230 mg via INTRAVENOUS

## 2021-03-14 MED ORDER — CYCLOBENZAPRINE HCL 10 MG PO TABS: 10.0000 mg | ORAL_TABLET | Freq: Three times a day (TID) | ORAL | 1 refills | Status: DC | PRN

## 2021-03-14 MED ORDER — LACTATED RINGERS IV SOLN: INTRAVENOUS | Status: DC | PRN

## 2021-03-14 MED ORDER — DEXAMETHASONE SODIUM PHOSPHATE 10 MG/ML IJ SOLN
INTRAMUSCULAR | Status: DC | PRN
  Administered 2021-03-14: 8 mg via INTRAVENOUS

## 2021-03-14 MED ORDER — FENTANYL CITRATE (PF) 100 MCG/2ML IJ SOLN
INTRAMUSCULAR | Status: DC | PRN
  Administered 2021-03-14 (×2): 50 ug via INTRAVENOUS

## 2021-03-14 MED ORDER — ONDANSETRON HCL 4 MG/2ML IJ SOLN: 4.0000 mg | Freq: Once | INTRAMUSCULAR | Status: DC | PRN

## 2021-03-14 MED ORDER — FENTANYL CITRATE (PF) 100 MCG/2ML IJ SOLN
INTRAMUSCULAR | Status: AC
  Filled 2021-03-14: qty 2

## 2021-03-14 MED ORDER — ONDANSETRON HCL 4 MG/2ML IJ SOLN
INTRAMUSCULAR | Status: DC | PRN
  Administered 2021-03-14: 4 mg via INTRAVENOUS

## 2021-03-14 MED ORDER — HYDROMORPHONE HCL 1 MG/ML IJ SOLN
INTRAMUSCULAR | Status: AC
  Administered 2021-03-14: 0.5 mg via INTRAVENOUS
  Filled 2021-03-14: qty 1

## 2021-03-14 MED ORDER — LIDOCAINE 2% (20 MG/ML) 5 ML SYRINGE
INTRAMUSCULAR | Status: DC | PRN
  Administered 2021-03-14: 50 mg via INTRAVENOUS

## 2021-03-14 MED ORDER — PROPOFOL 10 MG/ML IV BOLUS
INTRAVENOUS | Status: AC
  Filled 2021-03-14: qty 20

## 2021-03-14 MED ORDER — ONDANSETRON HCL 4 MG PO TABS: 4.0000 mg | ORAL_TABLET | Freq: Three times a day (TID) | ORAL | 0 refills | Status: DC | PRN

## 2021-03-14 MED ORDER — CEFAZOLIN SODIUM-DEXTROSE 2-4 GM/100ML-% IV SOLN
2.0000 g | INTRAVENOUS | Status: AC
  Administered 2021-03-14: 2 g via INTRAVENOUS
  Filled 2021-03-14: qty 100

## 2021-03-14 MED ORDER — ROCURONIUM BROMIDE 10 MG/ML (PF) SYRINGE
PREFILLED_SYRINGE | INTRAVENOUS | Status: DC | PRN
  Administered 2021-03-14 (×2): 10 mg via INTRAVENOUS
  Administered 2021-03-14: 50 mg via INTRAVENOUS

## 2021-03-14 MED ORDER — ONDANSETRON HCL 4 MG/2ML IJ SOLN
INTRAMUSCULAR | Status: AC
  Filled 2021-03-14: qty 2

## 2021-03-14 MED ORDER — DEXAMETHASONE SODIUM PHOSPHATE 10 MG/ML IJ SOLN
INTRAMUSCULAR | Status: AC
  Filled 2021-03-14: qty 1

## 2021-03-14 MED ORDER — SUGAMMADEX SODIUM 500 MG/5ML IV SOLN
INTRAVENOUS | Status: AC
  Filled 2021-03-14: qty 5

## 2021-03-14 MED ORDER — ROCURONIUM BROMIDE 10 MG/ML (PF) SYRINGE
PREFILLED_SYRINGE | INTRAVENOUS | Status: AC
  Filled 2021-03-14: qty 10

## 2021-03-14 MED ORDER — MIDAZOLAM HCL 2 MG/2ML IJ SOLN
1.0000 mg | Freq: Once | INTRAMUSCULAR | Status: AC
  Administered 2021-03-14: 2 mg via INTRAVENOUS
  Filled 2021-03-14: qty 2

## 2021-03-14 MED ORDER — FENTANYL CITRATE (PF) 100 MCG/2ML IJ SOLN
50.0000 ug | Freq: Once | INTRAMUSCULAR | Status: AC
  Administered 2021-03-14: 50 ug via INTRAVENOUS
  Filled 2021-03-14: qty 2

## 2021-03-14 MED ORDER — BUPIVACAINE LIPOSOME 1.3 % IJ SUSP
INTRAMUSCULAR | Status: DC | PRN
  Administered 2021-03-14: 10 mL via PERINEURAL

## 2021-03-14 MED ORDER — LIDOCAINE 2% (20 MG/ML) 5 ML SYRINGE
INTRAMUSCULAR | Status: AC
  Filled 2021-03-14: qty 5

## 2021-03-14 MED ORDER — PHENYLEPHRINE 40 MCG/ML (10ML) SYRINGE FOR IV PUSH (FOR BLOOD PRESSURE SUPPORT)
PREFILLED_SYRINGE | INTRAVENOUS | Status: DC | PRN
  Administered 2021-03-14: 120 ug via INTRAVENOUS

## 2021-03-14 MED ORDER — EPHEDRINE 5 MG/ML INJ
INTRAVENOUS | Status: AC
  Filled 2021-03-14: qty 10

## 2021-03-14 MED ORDER — SODIUM CHLORIDE 0.9 % IR SOLN
Status: DC | PRN
  Administered 2021-03-14: 12000 mL

## 2021-03-14 MED ORDER — OXYCODONE-ACETAMINOPHEN 5-325 MG PO TABS: 1.0000 | ORAL_TABLET | ORAL | 0 refills | Status: DC | PRN

## 2021-03-14 SURGICAL SUPPLY — 60 items
ANCH SUT PUSHLCK 19.5X3.5 STRL (Anchor) ×1 IMPLANT
ANCH SUT SWLK 19.1X4.75 VT (Anchor) ×4 IMPLANT
ANCHOR PEEK 4.75X19.1 SWLK C (Anchor) ×4 IMPLANT
ANCHOR PUSHLOCK PEEK 3.5X19.5 (Anchor) ×1 IMPLANT
BAG COUNTER SPONGE SURGICOUNT (BAG) ×2 IMPLANT
BAG SPNG CNTER NS LX DISP (BAG) ×1
BLADE EXCALIBUR 4.0X13 (MISCELLANEOUS) ×2 IMPLANT
BOOTIES KNEE HIGH SLOAN (MISCELLANEOUS) ×4 IMPLANT
BURR OVAL 8 FLU 4.0X13 (MISCELLANEOUS) ×2 IMPLANT
BURR OVAL 8 FLU 5.0X13 (MISCELLANEOUS) ×2 IMPLANT
CANNULA ACUFLEX KIT 5X76 (CANNULA) ×2 IMPLANT
CANNULA DRILOCK 5.0X75 (CANNULA) ×1 IMPLANT
CANNULA TWIST IN 8.25X7CM (CANNULA) ×1 IMPLANT
CONNECTOR 5 IN 1 STRAIGHT STRL (MISCELLANEOUS) ×2 IMPLANT
COOLER ICEMAN CLASSIC (MISCELLANEOUS) IMPLANT
DISSECTOR  3.8MM X 13CM (MISCELLANEOUS) ×2
DISSECTOR 3.8MM X 13CM (MISCELLANEOUS) ×1 IMPLANT
DRAPE INCISE 23X17 IOBAN STRL (DRAPES) ×1
DRAPE INCISE 23X17 STRL (DRAPES) ×1 IMPLANT
DRAPE INCISE IOBAN 23X17 STRL (DRAPES) ×1 IMPLANT
DRAPE INCISE IOBAN 66X45 STRL (DRAPES) ×2 IMPLANT
DRAPE ORTHO SPLIT 77X108 STRL (DRAPES)
DRAPE STERI 35X30 U-POUCH (DRAPES) ×2 IMPLANT
DRAPE SURG 17X11 SM STRL (DRAPES) ×2 IMPLANT
DRAPE SURG ORHT 6 SPLT 77X108 (DRAPES) IMPLANT
DRAPE U-SHAPE 47X51 STRL (DRAPES) ×2 IMPLANT
DRSG PAD ABDOMINAL 8X10 ST (GAUZE/BANDAGES/DRESSINGS) ×3 IMPLANT
DURAPREP 26ML APPLICATOR (WOUND CARE) ×2 IMPLANT
GAUZE SPONGE 4X4 12PLY STRL (GAUZE/BANDAGES/DRESSINGS) ×2 IMPLANT
GLOVE SURG ENC MOIS LTX SZ7.5 (GLOVE) ×2 IMPLANT
GLOVE SURG ENC MOIS LTX SZ8 (GLOVE) ×2 IMPLANT
GLOVE SURG MICRO LTX SZ7 (GLOVE) ×4 IMPLANT
GLOVE SURG MICRO LTX SZ7.5 (GLOVE) ×2 IMPLANT
GOWN STRL REUS W/TWL LRG LVL3 (GOWN DISPOSABLE) ×4 IMPLANT
KIT BASIN OR (CUSTOM PROCEDURE TRAY) ×2 IMPLANT
KIT SHOULDER TRACTION (DRAPES) ×2 IMPLANT
KIT TURNOVER KIT A (KITS) ×2 IMPLANT
MANIFOLD NEPTUNE II (INSTRUMENTS) ×2 IMPLANT
NDL SCORPION MULTI FIRE (NEEDLE) IMPLANT
NEEDLE SCORPION MULTI FIRE (NEEDLE) ×2 IMPLANT
NS IRRIG 1000ML POUR BTL (IV SOLUTION) ×2 IMPLANT
PACK ARTHROSCOPY WL (CUSTOM PROCEDURE TRAY) ×2 IMPLANT
PAD ARMBOARD 7.5X6 YLW CONV (MISCELLANEOUS) ×2 IMPLANT
PAD COLD SHLDR WRAP-ON (PAD) IMPLANT
PROBE APOLLO 90XL (SURGICAL WAND) ×2 IMPLANT
SLING ARM FOAM STRAP MED (SOFTGOODS) IMPLANT
SLING ULTRA II L (ORTHOPEDIC SUPPLIES) ×1 IMPLANT
SPONGE LAP 4X18 RFD (DISPOSABLE) ×2 IMPLANT
STRIP CLOSURE SKIN 1/2X4 (GAUZE/BANDAGES/DRESSINGS) ×2 IMPLANT
SUT FIBERWIRE #2 38 T-5 BLUE (SUTURE)
SUT MNCRL AB 3-0 PS2 18 (SUTURE) ×2 IMPLANT
SUT PDS AB 0 CT 36 (SUTURE) ×1 IMPLANT
SUT TIGER TAPE 7 IN WHITE (SUTURE) ×3 IMPLANT
SUTURE FIBERWR #2 38 T-5 BLUE (SUTURE) IMPLANT
TAPE FIBER 2MM 7IN #2 BLUE (SUTURE) IMPLANT
TAPE PAPER 3X10 WHT MICROPORE (GAUZE/BANDAGES/DRESSINGS) ×2 IMPLANT
TOWEL OR 17X26 10 PK STRL BLUE (TOWEL DISPOSABLE) ×2 IMPLANT
TUBING ARTHROSCOPY IRRIG 16FT (MISCELLANEOUS) ×2 IMPLANT
WATER STERILE IRR 1000ML POUR (IV SOLUTION) ×2 IMPLANT
YANKAUER SUCT BULB TIP 10FT TU (MISCELLANEOUS) ×2 IMPLANT

## 2021-03-14 NOTE — Op Note (Signed)
03/14/2021  3:27 PM  PATIENT:   [name]  65 y.o. male  PRE-OPERATIVE DIAGNOSIS:  right shoulder impingement, acromioclavicular osteoarthritis, rotator cuff tear  POST-OPERATIVE DIAGNOSIS: Same with the additional finding of severe bicep tendinitis and partial biceps tendon tearing as well as synovitis and degenerative labral tearing  PROCEDURE:  1.  Right shoulder examination under anesthesia.  2.  Right shoulder glenohumeral joint diagnostic arthroscopy  3.  Extensive debridement including synovectomy and debridement of labral tear  4.  Arthroscopic subacromial decompression and bursectomy  5.  Arthroscopic distal clavicle resection  6.  Arthroscopic biceps tendon tenodesis  7.  Arthroscopic rotator cuff repair using a double row suture bridge repair construct  SURGEON:  Ottis Sarnowski, Metta Clines M.D.  ASSISTANTS: Jenetta Loges, PA-C  ANESTHESIA:   General endotracheal and interscalene block with Exparel  EBL: Minimal  SPECIMEN: None  Drains: None   PATIENT DISPOSITION:  PACU - hemodynamically stable.    PLAN OF CARE: Discharge to home after PACU  Brief history:  Patient is a 65 year old gentleman who injured his right shoulder while working overhead using a drill with immediate complaints of pain which has been persistent since the original injury.  Clinical exam demonstrates painful and guarded motion with positive impingement sign significant weakness to manual muscle testing.  MRI scan confirms a relatively large and moderately retracted tear of the supra and infraspinatus.  Due to his ongoing pain and functional imitations he is brought to the operating this time for planned right shoulder arthroscopy as described o  Preoperatively, I counseled the patient regarding treatment options and risks versus benefits thereof.  Possible surgical complications were all reviewed including potential for bleeding, infection, neurovascular injury, persistent pain, loss of  motion, anesthetic complication, failure of the implant, recurrence of rotator cuff tear, and possible need for additional surgery. They understand and accept and agrees with our planned procedure.  Procedure detail:  After undergoing routine preop evaluation the patient received prophylactic antibiotics and interscalene block with Exparel was established in the holding area by the anesthesia department.  Patient was subsequently placed supine on the operating table and underwent the smooth induction of a general endotracheal anesthesia.  The left lateral decubitus position on a beanbag and appropriately padded and protected.  Right shoulder girdle region was sterilely prepped and draped in standard fashion.  Timeout was called.  A posterior portal was established in the glenohumeral joint and intraportal established under direct visualization.  There was a moderate hemarthrosis which were evacuated.  Diffuse cellulitis was noted.  Performed a limited synovectomy and anterior superior capsule release and also performed debridement of a degenerative labral tear noted anteriorly and superiorly.  Additionally was severe fraying and attenuation long head biceps tendon and so a tag suture of PDS was passed through the tendon for a tag stitch and was then separated from its superior glenoid attachment.  Again the articular surfaces were noted all to be in good condition.  There was noted to be a large and significant retracted tear of the entirety of the supra and infraspinatus.  At this point fluid and instruments were removed.  The arm was dropped down to 30 degrees of abduction with the arthroscope introduced in the subacromial space of the posterior portal and a direct lateral portal established in the subacromial space.  An extensive bursectomy was completed.  Periosteum was removed from the undersurface of the anterior half of the acromion and a subacromial decompression was performed with a bur crating a  type I  morphology.  A portal was then established directly anterior to the distal clavicle and a distal clavicle resection was performed irregular.  Type I morphology was created.  A portal was then established directly anterior to the distal clavicle and a distal clavicle resection was performed with a bur removing approximately 8 mm of bone.  We then completed the subacromial/subdeltoid bursectomy.  Rotator cuff tear was identified and trimmed back to healthy tissue.  The greater tuberosity was cleared of soft tissue and bur was then used to abrade the bone with a bleeding bed and additionally for the bone at the apex of the bicipital groove.  The previously tenotomized long head biceps tendon was then drawn up into the subacromial space and a whipstitch was passed using the #2 FiberWire and scorpion suture passer.  We then utilized a push lock suture anchor to transfix the long head biceps tendon at the apex of the bicipital groove.  At this point we performed our rotator cuff repair placing 2 Medial Row, Arthrex peek suture anchors equidistant across the width of the footprint through a stab wound and the final margin of the acromion and then each of these anchors had been loaded with a fiber tape such that a total of 8 suture limbs were then shuttled equidistant across the width of the rotator cuff tear using the scorpion suture passer.  She confirmed we did mobilize the rotator cuff showed good elasticity and excursion over the footprint of the tuberosity.  Once all the suture limbs were then passed they were then transferred in an alternating fashion with 2 lateral anchors creating a double row repair which nicely compressed the free margin of the rotator cuff tendon against the bony bed and tuberosity and the overall construct was much to our satisfaction.  All sutures were then clipped.  Final hemostasis was obtained.  Fluid and incensed removed.  The proximal graft with multiple inserted.  A dry dressing taped  about the right shoulder and the lateral arm was placed into a sling immobilizer with abduction pillow.  Patient was awakened, extubated, and taken to the recovery room in stable condition.  Jenetta Loges, PA-C was utilized as an Environmental consultant throughout this case, essential for help with positioning the patient, positioning extremity, tissue manipulation, implantation of the prosthesis, suture management, wound closure, and intraoperative decision-making.  Marin Shutter MD      Contact # [phone]

## 2021-03-14 NOTE — Anesthesia Procedure Notes (Signed)
Procedure Name: Intubation Date/Time: 03/14/2021 1:20 PM Performed by: Niel Hummer, CRNA Pre-anesthesia Checklist: Patient identified, Emergency Drugs available, Suction available and Patient being monitored Patient Re-evaluated:Patient Re-evaluated prior to induction Oxygen Delivery Method: Circle system utilized Preoxygenation: Pre-oxygenation with 100% oxygen Induction Type: IV induction Ventilation: Mask ventilation without difficulty Laryngoscope Size: Mac and 4 Grade View: Grade I Tube type: Oral Tube size: 7.5 mm Number of attempts: 1 Airway Equipment and Method: Stylet Placement Confirmation: ETT inserted through vocal cords under direct vision, positive ETCO2 and breath sounds checked- equal and bilateral Secured at: 23 cm Tube secured with: Tape Dental Injury: Teeth and Oropharynx as per pre-operative assessment

## 2021-03-14 NOTE — Anesthesia Postprocedure Evaluation (Signed)
Anesthesia Post Note  Patient: [name]  Procedure(s) Performed: Right shoulder arthroscopy, subacromial decompression, distal clavicle resection, rotator cuff repair, bicep tenodesis (Right: Shoulder)     Patient location during evaluation: PACU Anesthesia Type: General Level of consciousness: awake and alert Pain management: pain level controlled Vital Signs Assessment: post-procedure vital signs reviewed and stable Respiratory status: spontaneous breathing, nonlabored ventilation, respiratory function stable and patient connected to nasal cannula oxygen Cardiovascular status: blood pressure returned to baseline and stable Postop Assessment: no apparent nausea or vomiting Anesthetic complications: no   No notable events documented.  Last Vitals:  Vitals:   03/14/21 1545 03/14/21 1600  BP: 139/73 126/81  Pulse: 71 63  Resp: 11 12  Temp:    SpO2: 94% 94%    Last Pain:  Vitals:   03/14/21 1600  TempSrc:   PainSc: 6                  Barnet Glasgow

## 2021-03-14 NOTE — H&P (Signed)
[name]    Chief Complaint: right shoulder impingement, acromioclavicular osteoarthritis, rotator cuff tear HPI: The patient is a 65 y.o. male with the cute onset of right shoulder pain after working in an overhead position.  He noted a sensation of "tearing" in the right shoulder.  He has subsequently had continued significant right shoulder pain and functional mutations that have failed to respond to prolonged attempts at conservative management.  He is brought to the operating this time for planned right shoulder arthroscopy with anticipated rotator cuff repair  Past Medical History:  Diagnosis Date   Arthritis    hands   BMI 34.0-34.9,adult    Coronary artery disease    Dyspnea 02/07/2021   Headache    HTN (hypertension)    Hyperlipidemia    Pneumonia 02/07/2021    Past Surgical History:  Procedure Laterality Date   CYST REMOVAL HAND Right 2018   gallbladder removed  2008   LEFT HEART CATH AND CORONARY ANGIOGRAPHY N/A 02/07/2021   Procedure: LEFT HEART CATH AND CORONARY ANGIOGRAPHY;  Surgeon: Martinique, Peter M, MD;  Location: [facility];  Service: Cardiovascular;  Laterality: N/A;    Family History  Family history unknown: Yes    Social History:  reports that he quit smoking about 5 weeks ago. His smoking use included cigarettes. He has a 52.50 pack-year smoking history. He has never used smokeless tobacco. He reports current alcohol use. He reports that he does not use drugs.   Medications Prior to Admission  Medication Sig Dispense Refill   amlodipine-olmesartan (AZOR) 10-20 MG tablet Take 1 tablet by mouth daily.  1   aspirin 81 MG chewable tablet Chew 81 mg by mouth daily.     famotidine (PEPCID) 20 MG tablet Take 20 mg by mouth 2 (two) times daily.     metoprolol tartrate (LOPRESSOR) 25 MG tablet Take 0.5 tablets (12.5 mg total) by mouth 2 (two) times daily. 60 tablet 2   nitroGLYCERIN (NITROSTAT) 0.4 MG SL tablet Place 1 tablet (0.4 mg total) under the  tongue every 5 (five) minutes as needed for chest pain. 30 tablet 12   rosuvastatin (CRESTOR) 40 MG tablet Take 1 tablet (40 mg total) by mouth daily. 30 tablet 1   benzonatate (TESSALON) 100 MG capsule Take 1 capsule (100 mg total) by mouth 3 (three) times daily as needed for cough. (Patient not taking: Reported on 03/09/2021) 20 capsule 0   calcium carbonate (TUMS - DOSED IN MG ELEMENTAL CALCIUM) 500 MG chewable tablet Chew 1 tablet (200 mg of elemental calcium total) by mouth 3 (three) times daily as needed for indigestion or heartburn. (Patient not taking: Reported on 03/09/2021)     docusate sodium (COLACE) 100 MG capsule Take 1 capsule (100 mg total) by mouth 2 (two) times daily as needed for mild constipation. (Patient not taking: Reported on 03/09/2021) 10 capsule 0   guaiFENesin-dextromethorphan (ROBITUSSIN DM) 100-10 MG/5ML syrup Take 10 mLs by mouth every 6 (six) hours as needed for cough. (Patient not taking: Reported on 03/09/2021) 118 mL 0   nicotine (NICODERM CQ - DOSED IN MG/24 HOURS) 21 mg/24hr patch Place 1 patch (21 mg total) onto the skin daily. (Patient not taking: Reported on 03/09/2021) 28 patch 0   polyethylene glycol (MIRALAX / GLYCOLAX) 17 g packet Take 17 g by mouth 2 (two) times daily as needed for moderate constipation. (Patient not taking: Reported on 03/09/2021) 14 each 0     Physical Exam: Right shoulder demonstrates painful  and guarded motion is noted at his recent office visit.  Positive impingement sign.  Pain with manual muscle testing.  Plain radiographs demonstrate the joint to be congruently aligned.  MRI scan confirms a full-thickness tear of the rotator cuff.  AC joint arthritis also noted.  Vitals  Temp:  [98.3 F (36.8 C)] 98.3 F (36.8 C) (07/05 1104) Pulse Rate:  [51-68] 51 (07/05 1245) Resp:  [13-18] 13 (07/05 1245) BP: (131-152)/(81-88) 131/81 (07/05 1245) SpO2:  [96 %-99 %] 96 % (07/05 1245) Weight:  [111.1 kg] 111.1 kg (07/05  1117)  Assessment/Plan  Impression: right shoulder impingement, acromioclavicular osteoarthritis, rotator cuff tear  Plan of Action: Procedure(s): Right shoulder arthroscopy, subacromial decompression, distal clavicle resection, rotator cuff repair  Karlos Scadden M Toney Difatta 03/14/2021, 1:06 PM Contact # [phone]

## 2021-03-14 NOTE — Transfer of Care (Signed)
Immediate Anesthesia Transfer of Care Note  Patient: [name]  Procedure(s) Performed: Right shoulder arthroscopy, subacromial decompression, distal clavicle resection, rotator cuff repair, bicep tenodesis (Right: Shoulder)  Patient Location: PACU  Anesthesia Type:General  Level of Consciousness: awake, alert  and oriented  Airway & Oxygen Therapy: Patient Spontanous Breathing and Patient connected to face mask oxygen  Post-op Assessment: Report given to RN, Post -op Vital signs reviewed and stable and Patient moving all extremities X 4  Post vital signs: Reviewed and stable  Last Vitals:  Vitals Value Taken Time  BP 154/91 03/14/21 1532  Temp    Pulse 76 03/14/21 1533  Resp 18 03/14/21 1533  SpO2 96 % 03/14/21 1533  Vitals shown include unvalidated device data.  Last Pain:  Vitals:   03/14/21 1245  TempSrc:   PainSc: 2          Complications: No notable events documented.

## 2021-03-14 NOTE — Discharge Instructions (Addendum)
   Metta Clines. Supple, M.D., F.A.A.O.S. Orthopaedic Surgery Specializing in Arthroscopic and Reconstructive Surgery of the Shoulder [phone] [address] [place], [place] [address] - Fax [phone]  POST-OP SHOULDER ARTHROSCOPIC ROTATOR CUFF AND BICEPS TENODESIS INSTRUCTIONS  1. Call the office at [phone] to schedule your first post-op appointment 7-10 days from the date of your surgery.  2. Leave the steri-strips in place over your incisions when performing dressing changes and showering. You may remove your dressings and begin showering 72 hours from surgery. You can expect drainage that is clear to bloody in nature that occasionally will soak through your dressings. If this occurs go ahead and perform a dressing change. The drainage should lessen daily and when there is no drainage from your incisions feel free to go without a dressing.  3. Wear your sling/immobilizer at all times except to perform the exercises below or to occasionally let your arm dangle by your side, keep your elbow slightly bent to protect the biceps repair . You also need to sleep in your sling immobilizer until instructed otherwise.  4. Range of motion to your  wrist, and hand are encouraged 3-5 times daily. Exercise to your hand and fingers helps to reduce swelling you may experience.  5. Utilize ice to the shoulder 3-4 times minimum a day and additionally if you are experiencing pain.  6. You may one-armed drive when safely off of narcotics and muscle relaxants. You may use your hand that is in the sling to support the steering wheel only. However, should it be your right arm that is in the sling it is not to be used for gear shifting in a manual transmission.  7. Pain control following an exparel block  To help control your post-operative pain you received a nerve block  performed with Exparel which is a long acting anesthetic (numbing agent) which can provide pain relief and sensations of  numbness (and relief of pain) in the operative shoulder and arm for up to 3 days. Sometimes it provides mixed relief, meaning you may still have numbness in certain areas of the arm but can still be able to move  parts of that arm, hand, and fingers. We recommend that your prescribed pain medications  be used as needed. We do not feel it is necessary to "pre medicate" and "stay ahead" of pain.  Taking narcotic pain medications when you are not having any pain can lead to unnecessary and potentially dangerous side effects.    8. Pain medications can produce constipation along with their use. If you experience this, the use of an over the counter stool softener or laxative daily is recommended.   9. For additional questions or concerns, please do not hesitate to call the office. If after hours there is an answering service to forward your concerns to the physician on call.  POST-OP EXERCISES  Pendulum Exercises  Perform pendulum exercises while standing and bending at the waist. Support your uninvolved arm on a table or chair and allow your operated arm to hang freely BUT with elbow slightly bent. Make sure to do these exercises passively - not using you shoulder muscle.  Repeat 20 times. Do 3 sessions per day.   WHEN PERFORMING THESE EXERCISES TRY TO KEEP ELBOW SLIGHTLY BENT TO PROTECT YOUR BICEPS REPAIR!!!!!!!!

## 2021-03-14 NOTE — Progress Notes (Signed)
Assisted Dr. Chelsey Woodrum with right, ultrasound guided, interscalene  block. Side rails up, monitors on throughout procedure. See vital signs in flow sheet. Tolerated Procedure well.  

## 2021-03-14 NOTE — Anesthesia Procedure Notes (Signed)
Anesthesia Regional Block: Interscalene brachial plexus block   Pre-Anesthetic Checklist: , timeout performed,  Correct Patient, Correct Site, Correct Laterality,  Correct Procedure, Correct Position, site marked,  Risks and benefits discussed,  Surgical consent,  Pre-op evaluation,  At surgeon's request and post-op pain management  Laterality: Right  Prep: Maximum Sterile Barrier Precautions used, chloraprep       Needles:  Injection technique: Single-shot  Needle Type: Echogenic Stimulator Needle     Needle Length: 9cm  Needle Gauge: 22     Additional Needles:   Procedures:,,,, ultrasound used (permanent image in chart),,    Narrative:  Start time: 03/14/2021 12:40 PM End time: 03/14/2021 12:50 PM Injection made incrementally with aspirations every 5 mL.  Performed by: Personally  Anesthesiologist: Freddrick March, MD  Additional Notes: Monitors applied. No increased pain on injection. No increased resistance to injection. Injection made in 5cc increments. Good needle visualization. Patient tolerated procedure well.

## 2021-03-15 ENCOUNTER — Encounter (HOSPITAL_COMMUNITY): Payer: Self-pay | Admitting: Orthopedic Surgery

## 2021-03-16 ENCOUNTER — Ambulatory Visit (HOSPITAL_BASED_OUTPATIENT_CLINIC_OR_DEPARTMENT_OTHER): Payer: 59 | Admitting: Family

## 2021-05-08 ENCOUNTER — Telehealth: Payer: Self-pay | Admitting: Cardiology

## 2021-05-08 NOTE — Telephone Encounter (Signed)
Pt c/o swelling: STAT is pt has developed SOB within 24 hours  If swelling, where is the swelling located? Feet and ankles lower part of legs  How much weight have you gained and in what time span? 20 pounds less than a month  Have you gained 3 pounds in a day or 5 pounds in a week? Yes   Do you have a log of your daily weights (if so, list)? No   Are you currently taking a fluid pill? Yes low dose furosemide 20 mg PCP   Are you currently SOB? Not really   Have you traveled recently?

## 2021-05-08 NOTE — Telephone Encounter (Signed)
Returned call to patient who was calling in regard to recent weight gain and swelling in the leg, ankles, and feet. Patient states for the last 3 weeks he has noticed a large increase in swelling in his bilateral lower extremities. Patient reports that he has gained 20 pounds in 1 month. Patient states that he thinks he may be eating a little bit more than usual but denies any increase in sodium intake. Patient states that his PCP started him on low dose lasix at '20mg'$  daily and states he did not notice a difference in his weight or his swelling. Patient states that he also wore compression stockings and has not noticed a whole lot of difference with that either. Patient reports that his PCP would like him to increase his lasix at this time and reach out to his cardiologist to be seen. Patient denies any symptoms besides swelling, but is audibly short of breath on the phone. Made patient an appointment to see Dr. Debara Pickett on 9/1 at 4:30, as there were no open APP slots until mid September. Made patient aware of ED precautions should new or worsening symptoms develop. Advised patient I would forward message to Dr. Stanford Breed to make him aware. Patient verbalized understanding.

## 2021-05-11 ENCOUNTER — Encounter: Payer: Self-pay | Admitting: Internal Medicine

## 2021-05-11 ENCOUNTER — Ambulatory Visit (INDEPENDENT_AMBULATORY_CARE_PROVIDER_SITE_OTHER): Payer: 59 | Admitting: Internal Medicine

## 2021-05-11 ENCOUNTER — Other Ambulatory Visit: Payer: Self-pay

## 2021-05-11 VITALS — BP 140/86 | HR 64 | Ht 71.0 in | Wt 270.0 lb

## 2021-05-11 DIAGNOSIS — I10 Essential (primary) hypertension: Secondary | ICD-10-CM | POA: Diagnosis not present

## 2021-05-11 DIAGNOSIS — R600 Localized edema: Secondary | ICD-10-CM

## 2021-05-11 DIAGNOSIS — I2510 Atherosclerotic heart disease of native coronary artery without angina pectoris: Secondary | ICD-10-CM | POA: Diagnosis not present

## 2021-05-11 MED ORDER — AMLODIPINE-OLMESARTAN 5-40 MG PO TABS: 1.0000 | ORAL_TABLET | Freq: Every day | ORAL | 3 refills | Status: DC

## 2021-05-11 MED ORDER — FUROSEMIDE 40 MG PO TABS: 40.0000 mg | ORAL_TABLET | Freq: Two times a day (BID) | ORAL | 3 refills | Status: DC

## 2021-05-11 NOTE — Patient Instructions (Signed)
Medication Instructions: STOP amlodipine-olmesartan 10/'20mg'$  daily START amlodipine-olmesartan 5/'40mg'$  daily  INCREASE lasix to '40mg'$  twice daily  *If you need a refill on your cardiac medications before your next appointment, please call your pharmacy*   Lab Work: BNP & BMET tomorrow 05/12/21  If you have labs (blood work) drawn today and your tests are completely normal, you will receive your results only by: [place] (if you have MyChart) OR A paper copy in the mail If you have any lab test that is abnormal or we need to change your treatment, we will call you to review the results.   Testing/Procedures: [facility] @ [address] [employer]   Follow-Up: At [facility], you and your health needs are our priority.  As part of our continuing mission to provide you with exceptional heart care, we have created designated Provider Care Teams.  These Care Teams include your primary Cardiologist (physician) and Advanced Practice Providers (APPs -  Physician Assistants and Nurse Practitioners) who all work together to provide you with the care you need, when you need it.  We recommend signing up for the patient portal called "MyChart".  Sign up information is provided on this After Visit Summary.  MyChart is used to connect with patients for Virtual Visits (Telemedicine).  Patients are able to view lab/test results, encounter notes, upcoming appointments, etc.  Non-urgent messages can be sent to your provider as well.   To learn more about what you can do with MyChart, go to [email or web address].    Your next appointment:   AS SCHEDULED with Dr. Stanford Breed   Other Instructions  Monitor salt/sodium in your diet, elevate legs, compression stockings for swelling

## 2021-05-11 NOTE — Progress Notes (Addendum)
`  OFFICE NOTE  Chief Complaint:  Leg swelling, weight gain  Primary Care Physician: Orpah Melter, MD  HPI:  [name] is a 66 y.o. male patient of Dr. Stanford Breed with a past medial history significant for coronary artery disease with cardiac catheterization in May 2022 demonstrating chronic total occlusion of the right coronary artery with left-to-right collaterals and normal LVEDP.  Moderate aortic stenosis with a 30 mm mean gradient was noted.  An echocardiogram in May 2022 demonstrating LVEF 60 to 123456, grade 1 diastolic dysfunction, normal RV function, moderate aortic stenosis.  He notes that over the past several months he has had progressive weight gain and swelling.  He thinks he has gained about 30 to 40 pounds over the past month.  He specifically has noted significant lower extremity edema.  He contacted his primary care provider and was placed on 20 mg Lasix which she said did nothing.  Subsequently he was advised to increase that to 40 mg which she seemed to note that it helped increase urination however he has not noted a significant improvement in his swelling or weight.  He says that his legs feel heavy.  He denies any chest pain or low worsening shortness of breath.    PMHx:  Past Medical History:  Diagnosis Date   Arthritis    hands   BMI 34.0-34.9,adult    Coronary artery disease    Dyspnea 02/07/2021   Headache    HTN (hypertension)    Hyperlipidemia    Pneumonia 02/07/2021    Past Surgical History:  Procedure Laterality Date   CYST REMOVAL HAND Right 2018   gallbladder removed  2008   LEFT HEART CATH AND CORONARY ANGIOGRAPHY N/A 02/07/2021   Procedure: LEFT HEART CATH AND CORONARY ANGIOGRAPHY;  Surgeon: Martinique, Peter M, MD;  Location: [facility];  Service: Cardiovascular;  Laterality: N/A;   SHOULDER ARTHROSCOPY WITH ROTATOR CUFF REPAIR Right 03/14/2021   Procedure: Right shoulder arthroscopy, subacromial decompression, distal clavicle resection,  rotator cuff repair, bicep tenodesis;  Surgeon: Justice Britain, MD;  Location: [facility];  Service: Orthopedics;  Laterality: Right;  160mn    FAMHx:  Family History  Family history unknown: Yes    SOCHx:   reports that he quit smoking about 3 months ago. His smoking use included cigarettes. He has a 52.50 pack-year smoking history. He has never used smokeless tobacco. He reports current alcohol use. He reports that he does not use drugs.  ALLERGIES:  No Known Allergies  ROS: Pertinent items noted in HPI and remainder of comprehensive ROS otherwise negative.  HOME MEDS: Current Outpatient Medications on File Prior to Visit  Medication Sig Dispense Refill   aspirin 81 MG chewable tablet Chew 81 mg by mouth daily.     cyclobenzaprine (FLEXERIL) 10 MG tablet Take 1 tablet (10 mg total) by mouth 3 (three) times daily as needed for muscle spasms. 30 tablet 1   famotidine (PEPCID) 20 MG tablet Take 20 mg by mouth 2 (two) times daily.     metoprolol tartrate (LOPRESSOR) 25 MG tablet Take 0.5 tablets (12.5 mg total) by mouth 2 (two) times daily. 60 tablet 2   rosuvastatin (CRESTOR) 40 MG tablet Take 1 tablet (40 mg total) by mouth daily. 30 tablet 1   nitroGLYCERIN (NITROSTAT) 0.4 MG SL tablet Place 1 tablet (0.4 mg total) under the tongue every 5 (five) minutes as needed for chest pain. (Patient not taking: Reported on 05/11/2021) 30 tablet 12   ondansetron (  ZOFRAN) 4 MG tablet Take 1 tablet (4 mg total) by mouth every 8 (eight) hours as needed for nausea or vomiting. 10 tablet 0   oxyCODONE-acetaminophen (PERCOCET) 5-325 MG tablet Take 1 tablet by mouth every 4 (four) hours as needed (max 6 q). (Patient not taking: Reported on 05/11/2021) 20 tablet 0   polyethylene glycol (MIRALAX / GLYCOLAX) 17 g packet Take 17 g by mouth 2 (two) times daily as needed for moderate constipation. 14 each 0   No current facility-administered medications on file prior to visit.    LABS/IMAGING: No results found for  this or any previous visit (from the past 48 hour(s)). No results found.  LIPID PANEL: No results found for: CHOL, TRIG, HDL, CHOLHDL, VLDL, LDLCALC, LDLDIRECT   WEIGHTS: Wt Readings from Last 3 Encounters:  05/11/21 270 lb (122.5 kg)  03/14/21 245 lb (111.1 kg)  03/09/21 245 lb (111.1 kg)    VITALS: BP 140/86 (BP Location: Left Arm, Patient Position: Sitting, Cuff Size: Large)   Pulse 64   Ht '5\' 11"'$  (1.803 m)   Wt 270 lb (122.5 kg)   BMI 37.66 kg/m   EXAM: General appearance: alert, no distress, and moderately obese Neck: no carotid bruit, no JVD, and thyroid not enlarged, symmetric, no tenderness/mass/nodules Lungs: clear to auscultation bilaterally Heart: regular rate and rhythm, S1, S2 normal, and systolic murmur: Midsystolic 3/6, crescendo at 2nd right intercostal space Abdomen: soft, non-tender; bowel sounds normal; no masses,  no organomegaly Extremities: extremities normal, atraumatic, no cyanosis or edema Pulses: 2+ and symmetric Skin: Skin color, texture, turgor normal. No rashes or lesions Neurologic: Grossly normal Psych: Pleasant  EKG: Normal sinus rhythm at 64- personally reviewed  ASSESSMENT: Weight gain and leg edema History of coronary artery disease with CTO of the right coronary and left to right collaterals Moderate aortic stenosis Hypertension  PLAN: 1.   [name] has noted weight gain and leg edema over the past couple of months.  He had an echo back in May which showed normal systolic function and moderate aortic stenosis.  He is also noted some increasing abdominal girth suggestive this could possibly be a right heart dysfunction.  The RV apparently looked normal during his last echo.  I like to get a limited echo to rule this out.  I am more suspicious it may be medication related.  He is on Azor (combination olmesartan and amlodipine).  Currently that is the 10/20 mg dose.  Blood pressure is decently controlled at home although little elevated  in the office.  He currently has been on 40 mg Lasix daily with more urination however no significant change in weight or edema.  I advised him to increase his Lasix to 40 mg twice daily.  Will change around his combination blood pressure medicine to the Azor 5/40 mg daily, thus effectively lowering his calcium channel blocker which could be contributing to swelling.  We will also order blood work including a metabolic profile and BNP.  I will contact him with those results as well as his echo and plan that he follows up with Dr. Stanford Breed as scheduled.  Pixie Casino, MD, [facility], [place] Director of the Advanced Lipid Disorders &  Cardiovascular Risk Reduction Clinic Diplomate of the American Board of Clinical Lipidology Attending Cardiologist  Direct Dial: [phone]  Fax: [phone]  Website:  [email or web address] 05/11/2021, 8:19 PM

## 2021-05-13 LAB — BASIC METABOLIC PANEL
BUN/Creatinine Ratio: 10 (ref 10–24)
BUN: 11 mg/dL (ref 8–27)
CO2: 24 mmol/L (ref 20–29)
Calcium: 10.5 mg/dL — ABNORMAL HIGH (ref 8.6–10.2)
Chloride: 101 mmol/L (ref 96–106)
Creatinine, Ser: 1.06 mg/dL (ref 0.76–1.27)
Glucose: 94 mg/dL (ref 65–99)
Potassium: 4.1 mmol/L (ref 3.5–5.2)
Sodium: 141 mmol/L (ref 134–144)
eGFR: 77 mL/min/{1.73_m2} (ref >59)

## 2021-05-13 LAB — BRAIN NATRIURETIC PEPTIDE: BNP: 23 pg/mL (ref 0.0–100.0)

## 2021-05-16 DIAGNOSIS — M25511 Pain in right shoulder: Secondary | ICD-10-CM | POA: Diagnosis not present

## 2021-05-17 MED ORDER — FUROSEMIDE 40 MG PO TABS: 40.0000 mg | ORAL_TABLET | Freq: Every day | ORAL | 0 refills | Status: DC

## 2021-05-18 DIAGNOSIS — M25511 Pain in right shoulder: Secondary | ICD-10-CM | POA: Diagnosis not present

## 2021-05-31 ENCOUNTER — Other Ambulatory Visit (HOSPITAL_COMMUNITY): Payer: 59

## 2021-06-14 ENCOUNTER — Ambulatory Visit (HOSPITAL_COMMUNITY): Payer: 59 | Attending: Cardiovascular Disease

## 2021-06-14 ENCOUNTER — Other Ambulatory Visit: Payer: Self-pay

## 2021-06-14 DIAGNOSIS — R600 Localized edema: Secondary | ICD-10-CM | POA: Diagnosis not present

## 2021-06-14 DIAGNOSIS — I350 Nonrheumatic aortic (valve) stenosis: Secondary | ICD-10-CM | POA: Insufficient documentation

## 2021-06-14 DIAGNOSIS — I517 Cardiomegaly: Secondary | ICD-10-CM | POA: Insufficient documentation

## 2021-06-14 LAB — ECHOCARDIOGRAM LIMITED
AR max vel: 1.23 cm2
AV Area VTI: 1.26 cm2
AV Area mean vel: 1.2 cm2
AV Mean grad: 20 mmHg
AV Peak grad: 34.3 mmHg
Ao pk vel: 2.93 m/s
Area-P 1/2: 2.6 cm2
S' Lateral: 2.6 cm

## 2021-06-14 NOTE — Progress Notes (Unsigned)
The patient declined to consent to the administration of Definity.

## 2021-06-19 ENCOUNTER — Other Ambulatory Visit: Payer: Self-pay

## 2021-08-01 NOTE — Progress Notes (Signed)
HPI: Follow-up coronary artery disease.  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  Had non-ST elevation myocardial infarction May 2022.  Cardiac catheterization May 2022 showed occluded right coronary artery, 50% first diagonal, 30% second diagonal 25% circumflex.  There is moderate aortic stenosis with mean gradient 30 mmHg.  Last echocardiogram October 2022 showed normal LV function and moderate aortic stenosis (mean 20 mmHg), trace aortic insufficiency. Since last seen, he denies dyspnea, chest pain or syncope.  He does state that he has gained weight and has intermittent lower extremity edema.  He is now taking his Lasix 40 mg twice daily.  Current Outpatient Medications  Medication Sig Dispense Refill   amLODipine-olmesartan (AZOR) 5-40 MG tablet Take 1 tablet by mouth daily. 90 tablet 3   aspirin 81 MG chewable tablet Chew 81 mg by mouth daily.     cyclobenzaprine (FLEXERIL) 10 MG tablet Take 1 tablet (10 mg total) by mouth 3 (three) times daily as needed for muscle spasms. 30 tablet 1   famotidine (PEPCID) 20 MG tablet Take 20 mg by mouth 2 (two) times daily.     furosemide (LASIX) 40 MG tablet Take 1 tablet (40 mg total) by mouth daily. 90 tablet 0   metoprolol tartrate (LOPRESSOR) 25 MG tablet Take 0.5 tablets (12.5 mg total) by mouth 2 (two) times daily. 60 tablet 2   nitroGLYCERIN (NITROSTAT) 0.4 MG SL tablet Place 1 tablet (0.4 mg total) under the tongue every 5 (five) minutes as needed for chest pain. 30 tablet 12   oxyCODONE-acetaminophen (PERCOCET) 5-325 MG tablet Take 1 tablet by mouth every 4 (four) hours as needed (max 6 q). 20 tablet 0   rosuvastatin (CRESTOR) 40 MG tablet Take 1 tablet (40 mg total) by mouth  daily. 30 tablet 1   No current facility-administered medications for this visit.     Past Medical History:  Diagnosis Date   Arthritis    hands   BMI 34.0-34.9,adult    Coronary artery disease    Dyspnea 02/07/2021   Headache    HTN (hypertension)    Hyperlipidemia    Pneumonia 02/07/2021    Past Surgical History:  Procedure Laterality Date   CYST REMOVAL HAND Right 2018   gallbladder removed  2008   LEFT HEART CATH AND CORONARY ANGIOGRAPHY N/A 02/07/2021   Procedure: LEFT HEART CATH AND CORONARY ANGIOGRAPHY;  Surgeon: Martinique, Peter M, MD;  Location: [facility];  Service: Cardiovascular;  Laterality: N/A;   SHOULDER ARTHROSCOPY WITH ROTATOR CUFF REPAIR Right 03/14/2021   Procedure: Right shoulder arthroscopy, subacromial decompression, distal clavicle resection, rotator cuff repair, bicep tenodesis;  Surgeon: Justice Britain, MD;  Location: [facility];  Service: Orthopedics;  Laterality: Right;  148min    Social History   Socioeconomic History   Marital status: Married    Spouse name: Not on file   Number of children: 2   Years of education: 12   Highest education level: Not on file  Occupational History   Not on file  Tobacco Use   Smoking status: Former    Packs/day: 1.50    Years: 35.00    Pack years: 52.50    Types: Cigarettes    Quit date: 02/02/2021    Years since quitting: 0.5   Smokeless  tobacco: Never  Vaping Use   Vaping Use: Never used  Substance and Sexual Activity   Alcohol use: Yes    Comment: occasional   Drug use: Never   Sexual activity: Not on file  Other Topics Concern   Not on file  Social History Narrative   Right handed   One story home   Drinks caffeine   Social Determinants of Health   Financial Resource Strain: Not on file  Food Insecurity: Not on file  Transportation Needs: Not on file  Physical Activity: Not on file  Stress: Not on file  Social Connections: Not on file  Intimate Partner Violence: Not on file    Family  History  Family history unknown: Yes    ROS: no fevers or chills, productive cough, hemoptysis, dysphasia, odynophagia, melena, hematochezia, dysuria, hematuria, rash, seizure activity, orthopnea, PND, claudication. Remaining systems are negative.  Physical Exam: Well-developed well-nourished in no acute distress.  Skin is warm and dry.  HEENT is normal.  Neck is supple.  Chest is clear to auscultation with normal expansion.  Cardiovascular exam is regular rate and rhythm. 2/6 systolic murmur Abdominal exam nontender or distended. No masses palpated.  Positive bruit. Extremities show no edema. neuro grossly intact  A/P  1 coronary artery disease-patient denies chest pain.  Continue aspirin and statin.  2 vascular disease-patient will need follow-up with vascular surgery for ulcerated plaque in the aortic arch.  I have asked him to schedule this appointment.  3 hypertension-blood pressure is mildly elevated.  He also has developed lower extremity edema which may be partially contributed to by amlodipine.  Discontinue amlodipine and continue olmesartan.  Discontinue metoprolol and instead treat with labetalol 100 mg twice daily.  Follow blood pressure and adjust medications as needed.  4 hyperlipidemia-continue statin.  Check lipids and liver.  5 moderate aortic stenosis-we will plan follow-up echocardiogram October 2023 or sooner if necessary.  6 tobacco abuse-patient has discontinued since his hospitalization.  7 bruit-schedule abdominal ultrasound to exclude aneurysm.  [name], MD

## 2021-08-10 ENCOUNTER — Ambulatory Visit (INDEPENDENT_AMBULATORY_CARE_PROVIDER_SITE_OTHER): Payer: 59 | Admitting: Cardiology

## 2021-08-10 ENCOUNTER — Other Ambulatory Visit: Payer: Self-pay

## 2021-08-10 ENCOUNTER — Encounter: Payer: Self-pay | Admitting: Cardiology

## 2021-08-10 VITALS — BP 150/82 | HR 63 | Ht 71.0 in | Wt 278.0 lb

## 2021-08-10 DIAGNOSIS — R0989 Other specified symptoms and signs involving the circulatory and respiratory systems: Secondary | ICD-10-CM

## 2021-08-10 DIAGNOSIS — I350 Nonrheumatic aortic (valve) stenosis: Secondary | ICD-10-CM

## 2021-08-10 DIAGNOSIS — I2510 Atherosclerotic heart disease of native coronary artery without angina pectoris: Secondary | ICD-10-CM | POA: Diagnosis not present

## 2021-08-10 DIAGNOSIS — I10 Essential (primary) hypertension: Secondary | ICD-10-CM | POA: Diagnosis not present

## 2021-08-10 LAB — LIPID PANEL
Chol/HDL Ratio: 3.5 ratio (ref 0.0–5.0)
Cholesterol, Total: 149 mg/dL (ref 100–199)
HDL: 43 mg/dL (ref >39)
LDL Chol Calc (NIH): 80 mg/dL (ref 0–99)
Triglycerides: 150 mg/dL — ABNORMAL HIGH (ref 0–149)
VLDL Cholesterol Cal: 26 mg/dL (ref 5–40)

## 2021-08-10 LAB — COMPREHENSIVE METABOLIC PANEL
ALT: 26 IU/L (ref 0–44)
AST: 26 IU/L (ref 0–40)
Albumin/Globulin Ratio: 2 (ref 1.2–2.2)
Albumin: 4.8 g/dL (ref 3.8–4.8)
Alkaline Phosphatase: 86 IU/L (ref 44–121)
BUN/Creatinine Ratio: 9 — ABNORMAL LOW (ref 10–24)
BUN: 10 mg/dL (ref 8–27)
Bilirubin Total: 0.6 mg/dL (ref 0.0–1.2)
CO2: 26 mmol/L (ref 20–29)
Calcium: 10.2 mg/dL (ref 8.6–10.2)
Chloride: 102 mmol/L (ref 96–106)
Creatinine, Ser: 1.1 mg/dL (ref 0.76–1.27)
Globulin, Total: 2.4 g/dL (ref 1.5–4.5)
Glucose: 93 mg/dL (ref 70–99)
Potassium: 4.3 mmol/L (ref 3.5–5.2)
Sodium: 139 mmol/L (ref 134–144)
Total Protein: 7.2 g/dL (ref 6.0–8.5)
eGFR: 74 mL/min/{1.73_m2} (ref >59)

## 2021-08-10 MED ORDER — LABETALOL HCL 100 MG PO TABS: 100.0000 mg | ORAL_TABLET | Freq: Two times a day (BID) | ORAL | 3 refills | Status: DC

## 2021-08-10 MED ORDER — OLMESARTAN MEDOXOMIL 40 MG PO TABS: 40.0000 mg | ORAL_TABLET | Freq: Every day | ORAL | 3 refills | Status: DC

## 2021-08-10 NOTE — Patient Instructions (Signed)
Medication Instructions:   STOP AZOR  STOP METOPROLOL  START OLMESARTAN 40 MG ONCE DAILY  START LABETALOL 100 MG ONE TABLET TWICE DAILY  *If you need a refill on your cardiac medications before your next appointment, please call your pharmacy*   Lab Work:  Your physician recommends that you HAVE LAB WORK TODAY  If you have labs (blood work) drawn today and your tests are completely normal, you will receive your results only by: [place] (if you have MyChart) OR A paper copy in the mail If you have any lab test that is abnormal or we need to change your treatment, we will call you to review the results.   Testing/Procedures:  Your physician has requested that you have an abdominal aorta duplex. During this test, an ultrasound is used to evaluate the aorta. Allow 30 minutes for this exam. Do not eat after midnight the day before and avoid carbonated beverages [facility]   Follow-Up: At [facility], you and your health needs are our priority.  As part of our continuing mission to provide you with exceptional heart care, we have created designated Provider Care Teams.  These Care Teams include your primary Cardiologist (physician) and Advanced Practice Providers (APPs -  Physician Assistants and Nurse Practitioners) who all work together to provide you with the care you need, when you need it.  We recommend signing up for the patient portal called "MyChart".  Sign up information is provided on this After Visit Summary.  MyChart is used to connect with patients for Virtual Visits (Telemedicine).  Patients are able to view lab/test results, encounter notes, upcoming appointments, etc.  Non-urgent messages can be sent to your provider as well.   To learn more about what you can do with MyChart, go to [email or web address].    Your next appointment:   3 month(s)  The format for your next appointment:   In Person  Provider:   [name] MD    Other  Instructions  MAKE FOLLOW UP APPOINTMENT WITH DR Donzetta Matters AT [facility]

## 2021-08-11 ENCOUNTER — Encounter: Payer: Self-pay | Admitting: Cardiology

## 2021-08-11 ENCOUNTER — Telehealth: Payer: Self-pay | Admitting: *Deleted

## 2021-08-11 DIAGNOSIS — I2510 Atherosclerotic heart disease of native coronary artery without angina pectoris: Secondary | ICD-10-CM

## 2021-08-11 MED ORDER — EZETIMIBE 10 MG PO TABS: 10.0000 mg | ORAL_TABLET | Freq: Every day | ORAL | 3 refills | Status: DC

## 2021-08-11 NOTE — Telephone Encounter (Signed)
-----   Message from Lelon Perla, MD sent at 08/10/2021  5:05 PM EST ----- Add zetia 10 mg daily; lipids and liver 8 weeks [name]

## 2021-08-24 ENCOUNTER — Ambulatory Visit (HOSPITAL_COMMUNITY)
Admission: RE | Admit: 2021-08-24 | Discharge: 2021-08-24 | Disposition: A | Discharge status: Home / Self Care | Payer: 59 | Source: Ambulatory Visit | Attending: Cardiology | Admitting: Cardiology

## 2021-08-24 ENCOUNTER — Other Ambulatory Visit: Payer: Self-pay

## 2021-08-24 DIAGNOSIS — R0989 Other specified symptoms and signs involving the circulatory and respiratory systems: Secondary | ICD-10-CM | POA: Diagnosis not present

## 2021-08-24 DIAGNOSIS — Z87891 Personal history of nicotine dependence: Secondary | ICD-10-CM | POA: Diagnosis not present

## 2021-09-13 ENCOUNTER — Other Ambulatory Visit (HOSPITAL_COMMUNITY): Payer: Self-pay | Admitting: Cardiology

## 2021-09-13 DIAGNOSIS — I7143 Infrarenal abdominal aortic aneurysm, without rupture: Secondary | ICD-10-CM

## 2021-09-20 ENCOUNTER — Other Ambulatory Visit: Payer: Self-pay

## 2021-09-20 ENCOUNTER — Encounter: Payer: Self-pay | Admitting: Vascular Surgery

## 2021-09-20 ENCOUNTER — Ambulatory Visit: Payer: Medicare Other | Admitting: Vascular Surgery

## 2021-09-20 VITALS — BP 149/82 | HR 58 | Temp 98.2°F | Resp 20 | Ht 71.0 in | Wt 283.0 lb

## 2021-09-20 DIAGNOSIS — I771 Stricture of artery: Secondary | ICD-10-CM | POA: Diagnosis not present

## 2021-09-20 DIAGNOSIS — I7143 Infrarenal abdominal aortic aneurysm, without rupture: Secondary | ICD-10-CM | POA: Diagnosis not present

## 2021-09-20 NOTE — Progress Notes (Signed)
Patient ID: [name], male   DOB: Jul 23, 1956, 66 y.o.   MRN: [id number]  Reason for Consult: Follow-up   Referred by Orpah Melter, MD  Subjective:     HPI:  [name] is a 66 y.o. male has a history of headaches and vertigo coolness diagnosed with diplopia and underwent CT angio of head and neck that demonstrated innominate artery stenosis.  This subsequently resolved.  He was admitted with pneumonia thought to have coronary artery disease he is now on aspirin and a statin.  He did have cardiac catheterization but no intervention at that time.  He has no further visual disturbances.  He has quit smoking.  Past Medical History:  Diagnosis Date   Arthritis    hands   BMI 34.0-34.9,adult    Coronary artery disease    Dyspnea 02/07/2021   Headache    HTN (hypertension)    Hyperlipidemia    Pneumonia 02/07/2021   Family History  Family history unknown: Yes   Past Surgical History:  Procedure Laterality Date   CYST REMOVAL HAND Right 2018   gallbladder removed  2008   LEFT HEART CATH AND CORONARY ANGIOGRAPHY N/A 02/07/2021   Procedure: LEFT HEART CATH AND CORONARY ANGIOGRAPHY;  Surgeon: Martinique, Peter M, MD;  Location: [facility];  Service: Cardiovascular;  Laterality: N/A;   SHOULDER ARTHROSCOPY WITH ROTATOR CUFF REPAIR Right 03/14/2021   Procedure: Right shoulder arthroscopy, subacromial decompression, distal clavicle resection, rotator cuff repair, bicep tenodesis;  Surgeon: Justice Britain, MD;  Location: [facility];  Service: Orthopedics;  Laterality: Right;  134min    Short Social History:  Social History   Tobacco Use   Smoking status: Former    Packs/day: 1.50    Years: 35.00    Pack years: 52.50    Types: Cigarettes    Quit date: 02/02/2021    Years since quitting: 0.6   Smokeless tobacco: Never  Substance Use Topics   Alcohol use: Yes    Comment: occasional    No Known Allergies  Current Outpatient Medications  Medication Sig Dispense  Refill   aspirin 81 MG chewable tablet Chew 81 mg by mouth daily.     cyclobenzaprine (FLEXERIL) 10 MG tablet Take 10 mg by mouth 3 (three) times daily as needed for muscle spasms.     ezetimibe (ZETIA) 10 MG tablet Take 1 tablet (10 mg total) by mouth daily. 90 tablet 3   famotidine (PEPCID) 20 MG tablet Take 20 mg by mouth 2 (two) times daily.     labetalol (NORMODYNE) 100 MG tablet Take 1 tablet (100 mg total) by mouth 2 (two) times daily. 180 tablet 3   olmesartan (BENICAR) 40 MG tablet Take 1 tablet (40 mg total) by mouth daily. 90 tablet 3   rosuvastatin (CRESTOR) 40 MG tablet Take 1 tablet (40 mg total) by mouth daily. 30 tablet 1   furosemide (LASIX) 40 MG tablet Take 1 tablet (40 mg total) by mouth daily. 90 tablet 0   nitroGLYCERIN (NITROSTAT) 0.4 MG SL tablet Place 1 tablet (0.4 mg total) under the tongue every 5 (five) minutes as needed for chest pain. (Patient not taking: Reported on 09/20/2021) 30 tablet 12   oxyCODONE-acetaminophen (PERCOCET) 5-325 MG tablet Take 1 tablet by mouth every 4 (four) hours as needed (max 6 q). (Patient not taking: Reported on 09/20/2021) 20 tablet 0   No current facility-administered medications for this visit.    Review of Systems  Constitutional:  Wt gain  HENT: HENT negative.  Eyes: Eyes negative.  Respiratory: Respiratory negative.  Cardiovascular: Cardiovascular negative.  GI: Gastrointestinal negative.  Musculoskeletal:       Shoulder pain Skin: Skin negative.  Neurological: Neurological negative. Hematologic: Hematologic/lymphatic negative.  Psychiatric: Psychiatric negative.       Objective:  Objective   Vitals:   09/20/21 0837  BP: (!) 149/82  Pulse: (!) 58  Resp: 20  Temp: 98.2 F (36.8 C)  SpO2: 95%  Weight: 283 lb (128.4 kg)  Height: 5\' 11"  (1.803 m)   Body mass index is 39.47 kg/m.  Physical Exam Constitutional:      Appearance: He is obese.  HENT:     Head: Normocephalic.     Nose:     Comments: Wearing  a mask    Mouth/Throat:     Mouth: Mucous membranes are moist.  Eyes:     Pupils: Pupils are equal, round, and reactive to light.  Neck:     Vascular: No carotid bruit.  Cardiovascular:     Pulses:          Femoral pulses are 0 on the right side and 0 on the left side.    Heart sounds: Murmur heard.  Systolic murmur is present.     Comments: There is a right supraclavicular bruit Abdominal:     General: Abdomen is flat.     Palpations: Abdomen is soft.  Neurological:     Mental Status: He is alert.    Data: Abdominal Aorta Findings:  +-------------+-------+----------+----------+---------+--------+-----------  --+   Location      AP (cm) Trans (cm) PSV (cm/s) Waveform  Thrombus Comments         +-------------+-------+----------+----------+---------+--------+-----------  --+   Proximal      2.90    2.90       90         biphasic                            +-------------+-------+----------+----------+---------+--------+-----------  --+   Mid           3.60    3.50       66         biphasic  Present  fusiform         +-------------+-------+----------+----------+---------+--------+-----------  --+   Distal        3.80    3.80       52         biphasic           fusiform         +-------------+-------+----------+----------+---------+--------+-----------  --+   RT CIA Prox   1.6     1.6        168        biphasic                            +-------------+-------+----------+----------+---------+--------+-----------  --+   RT CIA Mid                       128        biphasic                            +-------------+-------+----------+----------+---------+--------+-----------  --+   RT CIA Distal  142        biphasic                            +-------------+-------+----------+----------+---------+--------+-----------  --+   RT EIA Prox                      89         triphasic                            +-------------+-------+----------+----------+---------+--------+-----------  --+   RT EIA Mid                       103        triphasic                           +-------------+-------+----------+----------+---------+--------+-----------  --+   RT EIA Distal 1.3     1.2        169        triphasic                           +-------------+-------+----------+----------+---------+--------+-----------  --+   LT CIA Prox   1.9     1.9        70         biphasic           ectatic           +-------------+-------+----------+----------+---------+--------+-----------  --+   LT CIA Mid                       71         biphasic                            +-------------+-------+----------+----------+---------+--------+-----------  --+   LT CIA Distal                    71         biphasic                            +-------------+-------+----------+----------+---------+--------+-----------  --+   LT EIA Prox   1.2     1.1        335        biphasic           >50%  stenosis   +-------------+-------+----------+----------+---------+--------+-----------  --+   LT EIA Mid                       93         triphasic                           +-------------+-------+----------+----------+---------+--------+-----------  --+   LT EIA Distal                    163        triphasic                           +-------------+-------+----------+----------+---------+--------+-----------  --+   Visualization of the Proximal Abdominal Aorta, Right CIA Distal artery,  Left  CIA Distal artery and Left EIA Proximal artery was limited.   IVC/Iliac Findings:  +--------+------+--------+--------+     IVC    Patent Thrombus Comments   +--------+------+--------+--------+   IVC Prox patent                     +--------+------+--------+--------+    Summary:  Abdominal Aorta: There is evidence of abnormal dilatation of the mid and  distal Abdominal aorta. The largest aortic measurement is 3.8 cm. The left   common iliac artery is ectatic.  Stenosis: +-------------------+-------------+   Location            Stenosis        +-------------------+-------------+   Mid Aorta           <50% stenosis   +-------------------+-------------+   Left External Iliac >50% stenosis   +-------------------+-------------+      Assessment/Plan:     66 year old male with previously known innominate artery stenosis with visual disturbances which have resolved.  He has not had this evaluated and 14 months was sent to see as initially after 6 months but missed his appointment due to hospitalization.  We will obtain a CT angio for review.  He will continue aspirin and statin hopefully will not need any intervention for this.  He was recently found to have 3.8 cm abdominal aortic aneurysm which she was unaware of and we can follow this in 2 years.    Waynetta Sandy MD [facility]

## 2021-10-02 ENCOUNTER — Encounter: Payer: Self-pay | Admitting: Cardiology

## 2021-10-09 ENCOUNTER — Other Ambulatory Visit: Payer: Self-pay

## 2021-10-09 DIAGNOSIS — I10 Essential (primary) hypertension: Secondary | ICD-10-CM

## 2021-10-09 DIAGNOSIS — I2510 Atherosclerotic heart disease of native coronary artery without angina pectoris: Secondary | ICD-10-CM

## 2021-10-09 MED ORDER — LABETALOL HCL 100 MG PO TABS: 100.0000 mg | ORAL_TABLET | Freq: Two times a day (BID) | ORAL | 3 refills | Status: DC

## 2021-10-09 MED ORDER — OLMESARTAN MEDOXOMIL 40 MG PO TABS: 40.0000 mg | ORAL_TABLET | Freq: Every day | ORAL | 3 refills | Status: DC

## 2021-10-09 MED ORDER — EZETIMIBE 10 MG PO TABS: 10.0000 mg | ORAL_TABLET | Freq: Every day | ORAL | 3 refills | Status: DC

## 2021-10-18 ENCOUNTER — Other Ambulatory Visit: Payer: Self-pay

## 2021-10-18 DIAGNOSIS — I7143 Infrarenal abdominal aortic aneurysm, without rupture: Secondary | ICD-10-CM

## 2021-10-24 DIAGNOSIS — L820 Inflamed seborrheic keratosis: Secondary | ICD-10-CM | POA: Diagnosis not present

## 2021-10-24 DIAGNOSIS — L918 Other hypertrophic disorders of the skin: Secondary | ICD-10-CM | POA: Diagnosis not present

## 2021-10-24 DIAGNOSIS — L853 Xerosis cutis: Secondary | ICD-10-CM | POA: Diagnosis not present

## 2021-10-24 DIAGNOSIS — L814 Other melanin hyperpigmentation: Secondary | ICD-10-CM | POA: Diagnosis not present

## 2021-10-24 DIAGNOSIS — L538 Other specified erythematous conditions: Secondary | ICD-10-CM | POA: Diagnosis not present

## 2021-10-24 DIAGNOSIS — Z789 Other specified health status: Secondary | ICD-10-CM | POA: Diagnosis not present

## 2021-10-24 DIAGNOSIS — L821 Other seborrheic keratosis: Secondary | ICD-10-CM | POA: Diagnosis not present

## 2021-10-24 DIAGNOSIS — L298 Other pruritus: Secondary | ICD-10-CM | POA: Diagnosis not present

## 2021-10-24 DIAGNOSIS — L570 Actinic keratosis: Secondary | ICD-10-CM | POA: Diagnosis not present

## 2021-10-24 DIAGNOSIS — L815 Leukoderma, not elsewhere classified: Secondary | ICD-10-CM | POA: Diagnosis not present

## 2021-11-08 DIAGNOSIS — L258 Unspecified contact dermatitis due to other agents: Secondary | ICD-10-CM | POA: Diagnosis not present

## 2021-11-08 DIAGNOSIS — L820 Inflamed seborrheic keratosis: Secondary | ICD-10-CM | POA: Diagnosis not present

## 2021-11-09 ENCOUNTER — Telehealth: Payer: Self-pay | Admitting: Cardiology

## 2021-11-09 ENCOUNTER — Encounter (INDEPENDENT_AMBULATORY_CARE_PROVIDER_SITE_OTHER): Payer: Self-pay

## 2021-11-09 DIAGNOSIS — Z79899 Other long term (current) drug therapy: Secondary | ICD-10-CM

## 2021-11-09 DIAGNOSIS — E785 Hyperlipidemia, unspecified: Secondary | ICD-10-CM

## 2021-11-09 NOTE — Telephone Encounter (Signed)
Spoke with pt, he is concerned because his appointment keeps being push out and he is currently off the rosuvastatin and he is concerned about being off this medication for an extended period of time. Discussed reason for statin related to his diagnosis. He reports taking lovastatin in the past with no problems and since stopping the crestor his pain has stopped. He is willing to try another statin. His appointment was rescheduled and moved up to 12/05/21. Aware will forward this message to dr Stanford Breed to see if he wants him to try another statin prior to being seen. ?

## 2021-11-09 NOTE — Telephone Encounter (Signed)
Patient would like to know if there is any way that he could be seen sooner than April 17th. He wanted to discuss his medications with Dr. Stanford Breed and does not want to see an APP. He was adamant that the scheduler that moved up his appt told him Dr. Stanford Breed had an opening 11/24/21 but Dr. Stanford Breed is not in the clinic that day. He is not happy about having to wait almost six weeks to see the MD  ? ?He did not mention which medications he had issues with.  ? ?He just wanted to talk to Hilda Blades ? ?

## 2021-11-14 ENCOUNTER — Ambulatory Visit
Admission: RE | Admit: 2021-11-14 | Discharge: 2021-11-14 | Disposition: A | Discharge status: Home / Self Care | Payer: Medicare Other | Source: Ambulatory Visit | Attending: Vascular Surgery | Admitting: Vascular Surgery

## 2021-11-14 DIAGNOSIS — I6523 Occlusion and stenosis of bilateral carotid arteries: Secondary | ICD-10-CM | POA: Diagnosis not present

## 2021-11-14 DIAGNOSIS — I771 Stricture of artery: Secondary | ICD-10-CM | POA: Diagnosis not present

## 2021-11-14 DIAGNOSIS — I700 Atherosclerosis of aorta: Secondary | ICD-10-CM | POA: Diagnosis not present

## 2021-11-14 DIAGNOSIS — I7143 Infrarenal abdominal aortic aneurysm, without rupture: Secondary | ICD-10-CM

## 2021-11-14 MED ORDER — LOVASTATIN 40 MG PO TABS: 40.0000 mg | ORAL_TABLET | Freq: Every day | ORAL | 3 refills | Status: DC

## 2021-11-14 MED ORDER — IOPAMIDOL (ISOVUE-370) INJECTION 76%
75.0000 mL | Freq: Once | INTRAVENOUS | Status: AC | PRN
  Administered 2021-11-14: 75 mL via INTRAVENOUS

## 2021-11-14 NOTE — Telephone Encounter (Signed)
Jerry Perla, MD  Cristopher Estimable, RN 4 days ago  ? ?Okay to remain off of Crestor.  Resume lovastatin 40 mg daily.  Check lipids and liver in 8 weeks.  ?[name]   ? ?Returned call to patient and made him aware of Dr. Jacalyn Lefevre recommendations. Prescription for Lovastatin '40mg'$  once daily sent to patient preferred pharmacy. Lab orders placed and mailed to patient.  ?

## 2021-11-15 ENCOUNTER — Ambulatory Visit: Payer: Medicare Other | Admitting: Vascular Surgery

## 2021-11-15 ENCOUNTER — Encounter: Payer: Self-pay | Admitting: Vascular Surgery

## 2021-11-15 ENCOUNTER — Other Ambulatory Visit: Payer: Self-pay

## 2021-11-15 VITALS — BP 138/82 | HR 69 | Temp 98.3°F | Resp 20 | Ht 71.0 in | Wt 279.0 lb

## 2021-11-15 DIAGNOSIS — I771 Stricture of artery: Secondary | ICD-10-CM | POA: Diagnosis not present

## 2021-11-15 DIAGNOSIS — I700 Atherosclerosis of aorta: Secondary | ICD-10-CM

## 2021-11-15 NOTE — Progress Notes (Signed)
? ?Patient ID: [name], male   DOB: 11-16-1955, 66 y.o.   MRN: [id number] ? ?Reason for Consult: No chief complaint on file. ?  ?Referred by Orpah Melter, MD ? ?Subjective:  ?   ?HPI: ? ?[name] is a 66 y.o. male I have previously seen for multiple episodes of headache and visual changes.  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  He had been a long-term smoker but has recently quit.  He states that he has had difficulty losing weight but he is now active and going to the gym regularly.  He does take aspirin but recently Crestor was stopped due to muscle pains and he has been started on lovastatin which he is taking without issue at this time.  He denies any history of stroke, TIA or amaurosis.  He has undergone cardiac catheterization in the past without any intervention. ? ?Past Medical History:  ?Diagnosis Date  ? 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 BMI 34.0-34.9,adult   ? Coronary artery disease   ? Dyspnea 02/07/2021  ? Headache   ? HTN (hypertension)   ? Hyperlipidemia   ? Pneumonia 02/07/2021  ? ?Family History  ?Family history unknown: Yes  ? ?Past Surgical History:  ?Procedure Laterality Date  ? CYST REMOVAL HAND Right 2018  ? gallbladder removed  2008  ? LEFT HEART CATH AND CORONARY ANGIOGRAPHY N/A 02/07/2021  ? Procedure: LEFT HEART CATH AND CORONARY ANGIOGRAPHY;  Surgeon: Martinique, Peter M, MD;  Location: [facility];  Service: Cardiovascular;  Laterality: N/A;  ? SHOULDER ARTHROSCOPY WITH ROTATOR CUFF REPAIR Right 03/14/2021  ? Procedure: Right shoulder arthroscopy, subacromial decompression, distal clavicle resection, rotator cuff repair, bicep tenodesis;  Surgeon: Justice Britain, MD;  Location: [facility];  Service: Orthopedics;  Laterality: Right;  119mn  ? ? ?Short Social History:  ?Social History  ? ?Tobacco Use  ? Smoking status: Former  ?  Packs/day: 1.50  ?  Years: 35.00  ?  Pack years: 52.50  ?  Types:  Cigarettes  ?  Quit date: 02/02/2021  ?  Years since quitting: 0.7  ? Smokeless tobacco: Never  ?Substance Use Topics  ? Alcohol use: Yes  ?  Comment: occasional  ? ? ?No Known Allergies ? ?Current Outpatient Medications  ?Medication Sig Dispense Refill  ? aspirin 81 MG chewable tablet Chew 81 mg by mouth daily.    ? cyclobenzaprine (FLEXERIL) 10 MG tablet Take 10 mg by mouth 3 (three) times daily as needed for muscle spasms.    ? ezetimibe (ZETIA) 10 MG tablet Take 1 tablet (10 mg total) by mouth daily. 90 tablet 3  ? famotidine (PEPCID) 20 MG tablet Take 20 mg by mouth 2 (two) times daily.    ? furosemide (LASIX) 40 MG tablet Take 1 tablet (40 mg total) by mouth daily. 90 tablet 0  ? labetalol (NORMODYNE) 100 MG tablet Take 1 tablet (100 mg total) by mouth 2 (two) times daily. 180 tablet 3  ? lovastatin (MEVACOR) 40 MG tablet Take 1 tablet (40 mg total) by mouth at bedtime. 90 tablet 3  ? olmesartan (BENICAR) 40 MG tablet Take 1 tablet (40 mg total) by mouth daily. 90 tablet 3  ? oxyCODONE-acetaminophen (PERCOCET) 5-325 MG tablet Take 1 tablet by mouth every 4 (four) hours as needed (max 6 q). (Patient not taking: Reported on 09/20/2021) 20 tablet 0  ? ?No current facility-administered medications for  this visit.  ? ? ?Review of Systems  ?Constitutional: Positive for unexpected weight change.  ?HENT: HENT negative.  ?Eyes: Positive for visual disturbance.   ?Respiratory: Respiratory negative.  ?Cardiovascular: Cardiovascular negative.  ?GI: Gastrointestinal negative.  ?Musculoskeletal:  ?     Right shoulder pain ?Skin:  ?     History of skin cancer ?Neurological: Neurological negative. ?Hematologic: Hematologic/lymphatic negative.  ?Psychiatric: Psychiatric negative.   ? ?   ?Objective:  ?Objective  ?Vitals:  ? 11/15/21 0926  ?BP: 138/82  ?Pulse: 69  ?Resp: 20  ?Temp: 98.3 ?F (36.8 ?C)  ?SpO2: 96%  ? ? ? ?Physical Exam ?Constitutional:   ?   Appearance: He is obese.  ?HENT:  ?   Head: Normocephalic.  ?   Nose:  ?    Comments: Wearing a mask ?Eyes:  ?   Pupils: Pupils are equal, round, and reactive to light.  ?Cardiovascular:  ?   Rate and Rhythm: Normal rate.  ?Pulmonary:  ?   Effort: Pulmonary effort is normal.  ?Abdominal:  ?   General: Abdomen is flat.  ?   Palpations: Abdomen is soft.  ?Musculoskeletal:     ?   General: Normal range of motion.  ?   Cervical back: Normal range of motion and neck supple.  ?   Right lower leg: No edema.  ?   Left lower leg: No edema.  ?Skin: ?   General: Skin is warm and dry.  ?   Capillary Refill: Capillary refill takes less than 2 seconds.  ?Neurological:  ?   General: No focal deficit present.  ?   Mental Status: He is alert.  ?Psychiatric:     ?   Mood and Affect: Mood normal.  ? ? ?Data: ?CTA neck IMPRESSION: ?1. 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 ?2. Intermittently poorly opacified non-dominant right vertebral ?artery, which remains patent. ?3. Similar approximately 40-50% stenosis of the left common carotid ?artery origin. ?4. Ulcerated 12 mm atherosclerotic plaque along the distal aortic ?arch has a broader base but is otherwise similar in appearance. ?  ?    ?Assessment/Plan:  ?  ? 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  These do not appear appreciably changed from his previous CT angio and these remain asymptomatic.  He is now on lovastatin and continues aspirin.  I discussed with the patient that I would not recommend any intervention for these difficult lesions given that they are asymptomatic and he has quit smoking which I congratulated him on.  He will continue to work on weight loss at the gym and with his diet.  He can follow-up in 1 year with carotid duplex which may not be able to demonstrate our lesions but will give [place] some suggestion of the inflow.  All of his questions were answered today and he demonstrates good understanding  of our discussion. ? ?  ? ?Waynetta Sandy MD ?[facility] ? ? ?

## 2021-11-24 NOTE — Progress Notes (Deleted)
? ? ? ? ?BPZ:WCHENI-DP coronary artery disease.  Had non-ST elevation myocardial infarction May 2022.  Cardiac catheterization May 2022 showed occluded right coronary artery, 50% first diagonal, 30% second diagonal 25% circumflex.  There is moderate aortic stenosis with mean gradient 30 mmHg.  Last echocardiogram October 2022 showed normal LV function and moderate aortic stenosis (mean 20 mmHg), trace aortic insufficiency.  Abdominal ultrasound December 2022 showed 3.8 cm abdominal aortic aneurysm.  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  Since last seen,  ? ?Current Outpatient Medications  ?Medication Sig Dispense Refill  ? aspirin 81 MG chewable tablet Chew 81 mg by mouth daily.    ? ezetimibe (ZETIA) 10 MG tablet Take 1 tablet (10 mg total) by mouth daily. 90 tablet 3  ? famotidine (PEPCID) 20 MG tablet Take 20 mg by mouth 2 (two) times daily.    ? furosemide (LASIX) 40 MG tablet Take 1 tablet (40 mg total) by mouth daily. 90 tablet 0  ? labetalol (NORMODYNE) 100 MG tablet Take 1 tablet (100 mg total) by mouth 2 (two) times daily. 180 tablet 3  ? lovastatin (MEVACOR) 40 MG tablet Take 1 tablet (40 mg total) by mouth at bedtime. 90 tablet 3  ? olmesartan (BENICAR) 40 MG tablet Take 1 tablet (40 mg total) by mouth daily. 90 tablet 3  ? ?No current facility-administered medications for this visit.  ? ? ? ?Past Medical History:  ?Diagnosis Date  ? 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 BMI 34.0-34.9,adult   ? Coronary artery disease   ? Dyspnea 02/07/2021  ? Headache   ? HTN (hypertension)   ? Hyperlipidemia   ? Pneumonia 02/07/2021  ? ? ?Past Surgical History:  ?Procedure Laterality Date  ? CYST REMOVAL HAND Right 2018  ? gallbladder removed  2008  ? LEFT HEART CATH AND CORONARY ANGIOGRAPHY N/A 02/07/2021  ? Procedure: LEFT HEART CATH AND CORONARY ANGIOGRAPHY;  Surgeon: Martinique, Peter M, MD;   Location: [facility];  Service: Cardiovascular;  Laterality: N/A;  ? SHOULDER ARTHROSCOPY WITH ROTATOR CUFF REPAIR Right 03/14/2021  ? Procedure: Right shoulder arthroscopy, subacromial decompression, distal clavicle resection, rotator cuff repair, bicep tenodesis;  Surgeon: Justice Britain, MD;  Location: [facility];  Service: Orthopedics;  Laterality: Right;  139mn  ? ? ?Social History  ? ?Socioeconomic History  ? Marital status: Married  ?  Spouse name: Not on file  ? Number of children: 2  ? Years of education: 175 ? Highest education level: Not on file  ?Occupational History  ? Not on file  ?Tobacco Use  ? Smoking status: Former  ?  Packs/day: 1.50  ?  Years: 35.00  ?  Pack years: 52.50  ?  Types: Cigarettes  ?  Quit date: 02/02/2021  ?  Years since quitting: 0.8  ? Smokeless tobacco: Never  ?Vaping Use  ? Vaping Use: Never used  ?Substance and Sexual Activity  ? Alcohol use: Yes  ?  Comment: occasional  ? Drug use: Never  ? Sexual activity: Not on file  ?Other Topics Concern  ? Not on file  ?Social History Narrative  ? Right handed  ? One story home  ? Drinks caffeine  ? ?Social Determinants of Health  ? ?Financial Resource Strain: Not on file  ?Food Insecurity: Not on file  ?Transportation Needs: Not on file  ?Physical Activity:  Not on file  ?Stress: Not on file  ?Social Connections: Not on file  ?Intimate Partner Violence: Not on file  ? ? ?Family History  ?Family history unknown: Yes  ? ? ?ROS: no fevers or chills, productive cough, hemoptysis, dysphasia, odynophagia, melena, hematochezia, dysuria, hematuria, rash, seizure activity, orthopnea, PND, pedal edema, claudication. Remaining systems are negative. ? ?Physical Exam: ?Well-developed well-nourished in no acute distress.  ?Skin is warm and dry.  ?HEENT is normal.  ?Neck is supple.  ?Chest is clear to auscultation with normal expansion.  ?Cardiovascular exam is regular rate and rhythm.  ?Abdominal exam nontender or distended. No masses  palpated. ?Extremities show no edema. ?neuro grossly intact ? ?ECG- personally reviewed ? ?A/P ? ?1 coronary artery disease-patient denies recurrent chest pain.  Plan to continue medical therapy with aspirin and statin. ? ?2 vascular disease-patient has been evaluated by vascular surgery and given that he is asymptomatic medical therapy is planned.  Continue aspirin and statin. ? ?3 hypertension-patient's blood pressure is controlled.  Continue present medical regimen. ? ?4 hyperlipidemia-continue statin. ? ?5 history of aortic stenosis-moderate on most recent echocardiogram.  Plan follow-up echocardiogram October 2023. ? ?6 abdominal aortic aneurysm-plan follow-up ultrasound December 2023. ? ?[name], MD ? ? ? ?

## 2021-11-27 NOTE — Progress Notes (Signed)
? ? ? ? ?HPI: Follow-up coronary artery disease. Had non-ST elevation myocardial infarction May 2022.  Cardiac catheterization May 2022 showed occluded right coronary artery, 50% first diagonal, 30% second diagonal 25% circumflex; moderate aortic stenosis with mean gradient 30 mmHg.  Last echocardiogram October 2022 showed normal LV function and moderate aortic stenosis (mean 20 mmHg), trace aortic insufficiency.  Abdominal ultrasound December 2022 showed 3.8 cm abdominal aortic aneurysm and ectatic left common iliac artery.  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  Since last seen, he denies CP, dyspnea, syncope or pedal edema.  He is concerned about weight gain. ? ?Current Outpatient Medications  ?Medication Sig Dispense Refill  ? aspirin 81 MG chewable tablet Chew 81 mg by mouth daily.    ? ezetimibe (ZETIA) 10 MG tablet Take 1 tablet (10 mg total) by mouth daily. 90 tablet 3  ? famotidine (PEPCID) 20 MG tablet Take 20 mg by mouth 2 (two) times daily.    ? furosemide (LASIX) 40 MG tablet Take 1 tablet (40 mg total) by mouth daily. 90 tablet 0  ? labetalol (NORMODYNE) 100 MG tablet Take 1 tablet (100 mg total) by mouth 2 (two) times daily. 180 tablet 3  ? lovastatin (MEVACOR) 40 MG tablet Take 1 tablet (40 mg total) by mouth at bedtime. 90 tablet 3  ? olmesartan (BENICAR) 40 MG tablet Take 1 tablet (40 mg total) by mouth daily. 90 tablet 3  ? ?No current facility-administered medications for this visit.  ? ? ? ?Past Medical History:  ?Diagnosis Date  ? 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 BMI 34.0-34.9,adult   ? Coronary artery disease   ? Dyspnea 02/07/2021  ? Headache   ? HTN (hypertension)   ? Hyperlipidemia   ? Pneumonia 02/07/2021  ? ? ?Past Surgical History:  ?Procedure Laterality Date  ? CYST REMOVAL HAND Right 2018  ? gallbladder removed  2008  ? LEFT HEART CATH AND CORONARY  ANGIOGRAPHY N/A 02/07/2021  ? Procedure: LEFT HEART CATH AND CORONARY ANGIOGRAPHY;  Surgeon: Martinique, Peter M, MD;  Location: [facility];  Service: Cardiovascular;  Laterality: N/A;  ? SHOULDER ARTHROSCOPY WITH ROTATOR CUFF REPAIR Right 03/14/2021  ? Procedure: Right shoulder arthroscopy, subacromial decompression, distal clavicle resection, rotator cuff repair, bicep tenodesis;  Surgeon: Justice Britain, MD;  Location: [facility];  Service: Orthopedics;  Laterality: Right;  167mn  ? ? ?Social History  ? ?Socioeconomic History  ? Marital status: Married  ?  Spouse name: Not on file  ? Number of children: 2  ? Years of education: 177 ? Highest education level: Not on file  ?Occupational History  ? Not on file  ?Tobacco Use  ? Smoking status: Former  ?  Packs/day: 1.50  ?  Years: 35.00  ?  Pack years: 52.50  ?  Types: Cigarettes  ?  Quit date: 02/02/2021  ?  Years since quitting: 0.8  ? Smokeless tobacco: Never  ?Vaping Use  ? Vaping Use: Never used  ?Substance and Sexual Activity  ? Alcohol use: Yes  ?  Comment: occasional  ? Drug use: Never  ? Sexual activity: Not on file  ?Other Topics Concern  ? Not on file  ?Social History Narrative  ? Right handed  ? One story home  ? Drinks caffeine  ? ?Social Determinants of Health  ? ?Financial Resource Strain: Not  on file  ?Food Insecurity: Not on file  ?Transportation Needs: Not on file  ?Physical Activity: Not on file  ?Stress: Not on file  ?Social Connections: Not on file  ?Intimate Partner Violence: Not on file  ? ? ?Family History  ?Family history unknown: Yes  ? ? ?ROS: Some shoulder discomfort but no fevers or chills, productive cough, hemoptysis, dysphasia, odynophagia, melena, hematochezia, dysuria, hematuria, rash, seizure activity, orthopnea, PND, pedal edema, claudication. Remaining systems are negative. ? ?Physical Exam: ?Well-developed obese in no acute distress.  ?Skin is warm and dry.  ?HEENT is normal.  ?Neck is supple.  ?Chest is clear to auscultation with  normal expansion.  ?Cardiovascular exam is regular rate and rhythm.  Distant heart sounds. ?Abdominal exam nontender or distended. No masses palpated. ?Extremities show no edema. ?neuro grossly intact ? ?A/P ? ?1 coronary artery disease-patient doing well with no chest pain.  Continue medical therapy with aspirin and statin. ? ?2 hypertension-blood pressure mildly elevated; however he checks this regularly at home and it is typically controlled.  Continue present medications and follow. ? ?3 hyperlipidemia-continue lovastatin.  He did not tolerate Crestor due to myalgias.  I discussed trying Lipitor as he is not at goal but he prefers to continue with his present regimen.  We also discussed potential for Repatha or Praluent but he declined. ? ?4 moderate aortic stenosis-plan follow-up echocardiogram October 2023. ? ?5 abdominal aortic aneurysm-plan follow-up ultrasound December 2023. ? ?6 vascular disease-now followed by vascular surgery.  Continue aspirin and statin. ? ?7 tobacco abuse-patient discontinued previously. ? ?[name], MD ? ? ? ?

## 2021-11-28 ENCOUNTER — Ambulatory Visit: Payer: Medicare Other | Admitting: Cardiology

## 2021-11-28 ENCOUNTER — Other Ambulatory Visit: Payer: Self-pay

## 2021-11-28 ENCOUNTER — Encounter: Payer: Self-pay | Admitting: Cardiology

## 2021-11-28 VITALS — BP 144/86 | HR 60 | Ht 71.0 in | Wt 276.0 lb

## 2021-11-28 DIAGNOSIS — I714 Abdominal aortic aneurysm, without rupture, unspecified: Secondary | ICD-10-CM | POA: Diagnosis not present

## 2021-11-28 DIAGNOSIS — I350 Nonrheumatic aortic (valve) stenosis: Secondary | ICD-10-CM

## 2021-11-28 DIAGNOSIS — I2510 Atherosclerotic heart disease of native coronary artery without angina pectoris: Secondary | ICD-10-CM

## 2021-11-28 DIAGNOSIS — E785 Hyperlipidemia, unspecified: Secondary | ICD-10-CM | POA: Diagnosis not present

## 2021-11-28 DIAGNOSIS — I10 Essential (primary) hypertension: Secondary | ICD-10-CM | POA: Diagnosis not present

## 2021-11-28 NOTE — Patient Instructions (Signed)
°  Follow-Up: °At [facility], you and your health needs are our priority.  As part of our continuing mission to provide you with exceptional heart care, we have created designated Provider Care Teams.  These Care Teams include your primary Cardiologist (physician) and Advanced Practice Providers (APPs -  Physician Assistants and Nurse Practitioners) who all work together to provide you with the care you need, when you need it. ° °We recommend signing up for the patient portal called "MyChart".  Sign up information is provided on this After Visit Summary.  MyChart is used to connect with patients for Virtual Visits (Telemedicine).  Patients are able to view lab/test results, encounter notes, upcoming appointments, etc.  Non-urgent messages can be sent to your provider as well.   °To learn more about what you can do with MyChart, go to [email or web address].   ° °Your next appointment:   °6 month(s) ° °The format for your next appointment:   °In Person ° °Provider:  Brian Crenshaw MD ° °}  ° ° °

## 2021-12-05 ENCOUNTER — Ambulatory Visit: Payer: 59 | Admitting: Cardiology

## 2021-12-07 ENCOUNTER — Encounter: Payer: Self-pay | Admitting: Cardiology

## 2021-12-14 ENCOUNTER — Ambulatory Visit: Payer: 59 | Admitting: Cardiology

## 2021-12-20 DIAGNOSIS — L820 Inflamed seborrheic keratosis: Secondary | ICD-10-CM | POA: Diagnosis not present

## 2021-12-25 ENCOUNTER — Ambulatory Visit: Payer: 59 | Admitting: Cardiology

## 2022-01-01 ENCOUNTER — Ambulatory Visit: Payer: 59 | Admitting: Cardiology

## 2022-01-09 DIAGNOSIS — E782 Mixed hyperlipidemia: Secondary | ICD-10-CM | POA: Diagnosis not present

## 2022-01-09 DIAGNOSIS — I2510 Atherosclerotic heart disease of native coronary artery without angina pectoris: Secondary | ICD-10-CM | POA: Diagnosis not present

## 2022-01-09 DIAGNOSIS — Z0000 Encounter for general adult medical examination without abnormal findings: Secondary | ICD-10-CM | POA: Diagnosis not present

## 2022-01-09 DIAGNOSIS — Z1211 Encounter for screening for malignant neoplasm of colon: Secondary | ICD-10-CM | POA: Diagnosis not present

## 2022-01-09 DIAGNOSIS — I10 Essential (primary) hypertension: Secondary | ICD-10-CM | POA: Diagnosis not present

## 2022-01-09 DIAGNOSIS — I350 Nonrheumatic aortic (valve) stenosis: Secondary | ICD-10-CM | POA: Diagnosis not present

## 2022-01-09 DIAGNOSIS — G720 Drug-induced myopathy: Secondary | ICD-10-CM | POA: Diagnosis not present

## 2022-01-09 DIAGNOSIS — Z23 Encounter for immunization: Secondary | ICD-10-CM | POA: Diagnosis not present

## 2022-01-31 DIAGNOSIS — L820 Inflamed seborrheic keratosis: Secondary | ICD-10-CM | POA: Diagnosis not present

## 2022-01-31 DIAGNOSIS — B078 Other viral warts: Secondary | ICD-10-CM | POA: Diagnosis not present

## 2022-03-27 ENCOUNTER — Telehealth: Payer: Self-pay

## 2022-03-27 NOTE — Telephone Encounter (Signed)
   Pre-operative Risk Assessment    Patient Name: [name]  DOB: [birth year] MRN: [id number]      Request for Surgical Clearance    Procedure:   Colonoscopy  Date of Surgery:  Clearance 04/12/22                                 Surgeon:  Dr. Cristina Gong Surgeon's Group or Practice Name:  [facility] Phone number:  [phone] Fax number:  [phone]   Type of Clearance Requested:   - Medical    Type of Anesthesia:  Not Indicated   Additional requests/questions:  Please advise surgeon/provider what medications should be held.  Signed, Elsie Lincoln Nyajah Hyson   03/27/2022, 3:49 PM

## 2022-03-28 NOTE — Telephone Encounter (Signed)
   Name: [name]  DOB: Dec 01, 1955  MRN: [id number]  Primary Cardiologist: None   Preoperative team, please contact this patient and set up a phone call appointment for further preoperative risk assessment. Please obtain consent and complete medication review. Thank you for your help.  I confirm that guidance regarding antiplatelet and oral anticoagulation therapy has been completed and, if necessary, noted below.  Patient takes aspirin 81 mg daily.  Ideally, patient will continue aspirin throughout the perioperative period.  Lenna Sciara, NP 03/28/2022, 5:15 PM [place] [address] [place], [place] [address]

## 2022-03-30 ENCOUNTER — Telehealth: Payer: Self-pay | Admitting: *Deleted

## 2022-03-30 DIAGNOSIS — D23122 Other benign neoplasm of skin of left lower eyelid, including canthus: Secondary | ICD-10-CM | POA: Diagnosis not present

## 2022-03-30 NOTE — Telephone Encounter (Signed)
Pt agreeable to plan of care for tele pre op appt 04/04/22 @ 9:40 per pt request. Med rec and consent are done

## 2022-03-30 NOTE — Telephone Encounter (Signed)
Pt agreeable to plan of care for tele pre op appt 04/04/22 @ 9:40 per pt request. Med rec and consent are done.     Patient Consent for Virtual Visit        [name] has provided verbal consent on 03/30/2022 for a virtual visit (video or telephone).   CONSENT FOR VIRTUAL VISIT FOR:  [name]  By participating in this virtual visit I agree to the following:  I hereby voluntarily request, consent and authorize [facility] and its employed or contracted physicians, physician assistants, nurse practitioners or other licensed health care professionals (the Practitioner), to provide me with telemedicine health care services (the "Services") as deemed necessary by the treating Practitioner. I acknowledge and consent to receive the Services by the Practitioner via telemedicine. I understand that the telemedicine visit will involve communicating with the Practitioner through live audiovisual communication technology and the disclosure of certain medical information by electronic transmission. I acknowledge that I have been given the opportunity to request an in-person assessment or other available alternative prior to the telemedicine visit and am voluntarily participating in the telemedicine visit.  I understand that I have the right to withhold or withdraw my consent to the use of telemedicine in the course of my care at any time, without affecting my right to future care or treatment, and that the Practitioner or I may terminate the telemedicine visit at any time. I understand that I have the right to inspect all information obtained and/or recorded in the course of the telemedicine visit and may receive copies of available information for a reasonable fee.  I understand that some of the potential risks of receiving the Services via telemedicine include:  Delay or interruption in medical evaluation due to technological equipment failure or disruption; Information transmitted may not be  sufficient (e.g. poor resolution of images) to allow for appropriate medical decision making by the Practitioner; and/or  In rare instances, security protocols could fail, causing a breach of personal health information.  Furthermore, I acknowledge that it is my responsibility to provide information about my medical history, conditions and care that is complete and accurate to the best of my ability. I acknowledge that Practitioner's advice, recommendations, and/or decision may be based on factors not within their control, such as incomplete or inaccurate data provided by me or distortions of diagnostic images or specimens that may result from electronic transmissions. I understand that the practice of medicine is not an exact science and that Practitioner makes no warranties or guarantees regarding treatment outcomes. I acknowledge that a copy of this consent can be made available to me via my patient portal ([facility]), or I can request a printed copy by calling the office of [facility].    I understand that my insurance will be billed for this visit.   I have read or had this consent read to me. I understand the contents of this consent, which adequately explains the benefits and risks of the Services being provided via telemedicine.  I have been provided ample opportunity to ask questions regarding this consent and the Services and have had my questions answered to my satisfaction. I give my informed consent for the services to be provided through the use of telemedicine in my medical care

## 2022-04-04 ENCOUNTER — Ambulatory Visit (INDEPENDENT_AMBULATORY_CARE_PROVIDER_SITE_OTHER): Payer: Medicare Other | Admitting: Physician Assistant

## 2022-04-04 DIAGNOSIS — Z01810 Encounter for preprocedural cardiovascular examination: Secondary | ICD-10-CM

## 2022-04-04 NOTE — Progress Notes (Signed)
Virtual Visit via Telephone Note   Because of [name]'s co-morbid illnesses, he is at least at moderate risk for complications without adequate follow up.  This format is felt to be most appropriate for this patient at this time.  The patient did not have access to video technology/had technical difficulties with video requiring transitioning to audio format only (telephone).  All issues noted in this document were discussed and addressed.  No physical exam could be performed with this format.  Please refer to the patient's chart for his consent to telehealth for [facility].  Evaluation Performed:  Preoperative cardiovascular risk assessment _____________   Date:  04/04/2022   Patient ID:  [name], DOB 08/31/1956, MRN [id number] Patient Location:  Home Provider location:   Office  Primary Care Provider:  Orpah Melter, MD Primary Cardiologist:  [name], MD  Chief Complaint / Patient Profile   66 y.o. y/o male with a h/o CAD, hypertension, and hyperlipidemia who is pending colonoscopy and presents today for telephonic preoperative cardiovascular risk assessment.  Past Medical History    Past Medical History:  Diagnosis Date   Arthritis    hands   BMI 34.0-34.9,adult    Coronary artery disease    Dyspnea 02/07/2021   Headache    HTN (hypertension)    Hyperlipidemia    Pneumonia 02/07/2021   Past Surgical History:  Procedure Laterality Date   CYST REMOVAL HAND Right 2018   gallbladder removed  2008   LEFT HEART CATH AND CORONARY ANGIOGRAPHY N/A 02/07/2021   Procedure: LEFT HEART CATH AND CORONARY ANGIOGRAPHY;  Surgeon: Martinique, Peter M, MD;  Location: [facility];  Service: Cardiovascular;  Laterality: N/A;   SHOULDER ARTHROSCOPY WITH ROTATOR CUFF REPAIR Right 03/14/2021   Procedure: Right shoulder arthroscopy, subacromial decompression, distal clavicle resection, rotator cuff repair, bicep tenodesis;  Surgeon: Justice Britain, MD;  Location:  [facility];  Service: Orthopedics;  Laterality: Right;  169mn    Allergies  Allergies  Allergen Reactions   Lovastatin     PT CANNOT TAKE STATINS, SEVERE MYALGIAS    History of Present Illness    RGIDEON BURSTEINis a 66y.o. male who presents via audio/video conferencing for a telehealth visit today.  Pt was last seen in cardiology clinic on 11/28/2021 by Dr. CStanford Breed  At that time Jerry COORwas doing well.  The patient is now pending procedure as outlined above. Since his last visit, he has been doing well and is able to walk a mile each day and goes to the gym 3 times a week without any exertional chest discomfort or worsening dyspnea.  Patient is cleared to proceed with colonoscopy procedure.   Home Medications    Prior to Admission medications   Medication Sig Start Date End Date Taking? Authorizing Provider  aspirin 81 MG chewable tablet Chew 81 mg by mouth daily.    [provider]  ezetimibe (ZETIA) 10 MG tablet Take 1 tablet (10 mg total) by mouth daily. 10/09/21 03/30/22  CLelon Perla MD  famotidine (PEPCID) 20 MG tablet Take 20 mg by mouth 2 (two) times daily. 12/28/19   [provider]  furosemide (LASIX) 40 MG tablet Take 1 tablet (40 mg total) by mouth daily. 05/17/21 03/30/22  Hilty, KNadean Corwin MD  labetalol (NORMODYNE) 100 MG tablet Take 1 tablet (100 mg total) by mouth 2 (two) times daily. 10/09/21   CLelon Perla MD  lovastatin (MEVACOR) 40 MG tablet Take  1 tablet (40 mg total) by mouth at bedtime. Patient not taking: Reported on 03/30/2022 11/14/21   Lelon Perla, MD  olmesartan (BENICAR) 40 MG tablet Take 1 tablet (40 mg total) by mouth daily. 10/09/21   Lelon Perla, MD    Physical Exam    Vital Signs:  [name] does not have vital signs available for review today.  Given telephonic nature of communication, physical exam is limited. AAOx3. NAD. Normal affect.  Speech and respirations are unlabored.  Accessory Clinical  Findings    None  Assessment & Plan    1.  Preoperative Cardiovascular Risk Assessment:  -Patient has upcoming colonoscopy on 04/12/2022 by Dr. Cristina Gong.  He denies any recent exertional chest pain or worsening dyspnea.  He is at acceptable risk to proceed with colonoscopy without additional work-up.  Ideally, he should continue on aspirin through the procedure, however if needed he may hold aspirin for 5 days prior to the procedure and restart as soon as possible afterward.  A copy of this note will be routed to requesting surgeon.  Time:   Today, I have spent 9 minutes with the patient with telehealth technology discussing medical history, symptoms, and management plan.     [place], [place]  04/04/2022, 12:02 PM

## 2022-04-12 DIAGNOSIS — K5730 Diverticulosis of large intestine without perforation or abscess without bleeding: Secondary | ICD-10-CM | POA: Diagnosis not present

## 2022-04-12 DIAGNOSIS — Z1211 Encounter for screening for malignant neoplasm of colon: Secondary | ICD-10-CM | POA: Diagnosis not present

## 2022-04-12 DIAGNOSIS — Z800 Family history of malignant neoplasm of digestive organs: Secondary | ICD-10-CM | POA: Diagnosis not present

## 2022-04-12 DIAGNOSIS — D122 Benign neoplasm of ascending colon: Secondary | ICD-10-CM | POA: Diagnosis not present

## 2022-04-12 DIAGNOSIS — D123 Benign neoplasm of transverse colon: Secondary | ICD-10-CM | POA: Diagnosis not present

## 2022-04-16 DIAGNOSIS — D123 Benign neoplasm of transverse colon: Secondary | ICD-10-CM | POA: Diagnosis not present

## 2022-04-16 DIAGNOSIS — D122 Benign neoplasm of ascending colon: Secondary | ICD-10-CM | POA: Diagnosis not present

## 2022-05-11 DIAGNOSIS — L821 Other seborrheic keratosis: Secondary | ICD-10-CM | POA: Diagnosis not present

## 2022-05-11 DIAGNOSIS — D23122 Other benign neoplasm of skin of left lower eyelid, including canthus: Secondary | ICD-10-CM | POA: Diagnosis not present

## 2022-05-11 DIAGNOSIS — D369 Benign neoplasm, unspecified site: Secondary | ICD-10-CM | POA: Diagnosis not present

## 2022-05-11 DIAGNOSIS — L820 Inflamed seborrheic keratosis: Secondary | ICD-10-CM | POA: Diagnosis not present

## 2022-05-25 DIAGNOSIS — D23122 Other benign neoplasm of skin of left lower eyelid, including canthus: Secondary | ICD-10-CM | POA: Diagnosis not present

## 2022-05-25 NOTE — Progress Notes (Signed)
HPI: Follow-up coronary artery disease. Had non-ST elevation myocardial infarction May 2022. Cardiac catheterization May 2022 showed occluded right coronary artery, 50% first diagonal, 30% second diagonal 25% circumflex; moderate aortic stenosis with mean gradient 30 mmHg.  Last echocardiogram October 2022 showed normal LV function and moderate aortic stenosis (mean 20 mmHg), trace aortic insufficiency.  Abdominal ultrasound December 2022 showed 3.8 cm abdominal aortic aneurysm and ectatic left common iliac artery.  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  Since last seen, the patient has dyspnea with more extreme activities but not with routine activities. It is relieved with rest. It is not associated with chest pain. There is no orthopnea, PND or pedal edema. There is no syncope or palpitations. There is no exertional chest pain.   Current Outpatient Medications  Medication Sig Dispense Refill   aspirin 81 MG chewable tablet Chew 81 mg by mouth daily.     famotidine (PEPCID) 20 MG tablet Take 20 mg by mouth 2 (two) times daily.     furosemide (LASIX) 40 MG tablet Take 1 tablet (40 mg total) by mouth daily. 90 tablet 0   labetalol (NORMODYNE) 100 MG tablet Take 1 tablet (100 mg total) by mouth 2 (two) times daily. 180 tablet 3   olmesartan (BENICAR) 40 MG tablet Take 1 tablet (40 mg total) by mouth daily. 90 tablet 3   ezetimibe (ZETIA) 10 MG tablet Take 1 tablet (10 mg total) by mouth daily. 90 tablet 3   No current facility-administered medications for this visit.     Past Medical History:  Diagnosis Date   Arthritis    hands   BMI 34.0-34.9,adult    Coronary artery disease    Dyspnea 02/07/2021   Headache    HTN (hypertension)    Hyperlipidemia    Pneumonia 02/07/2021    Past Surgical History:  Procedure Laterality Date   CYST REMOVAL  HAND Right 2018   gallbladder removed  2008   LEFT HEART CATH AND CORONARY ANGIOGRAPHY N/A 02/07/2021   Procedure: LEFT HEART CATH AND CORONARY ANGIOGRAPHY;  Surgeon: Martinique, Peter M, MD;  Location: [facility];  Service: Cardiovascular;  Laterality: N/A;   SHOULDER ARTHROSCOPY WITH ROTATOR CUFF REPAIR Right 03/14/2021   Procedure: Right shoulder arthroscopy, subacromial decompression, distal clavicle resection, rotator cuff repair, bicep tenodesis;  Surgeon: Justice Britain, MD;  Location: [facility];  Service: Orthopedics;  Laterality: Right;  149mn    Social History   Socioeconomic History   Marital status: Married    Spouse name: Not on file   Number of children: 2   Years of education: 12   Highest education level: Not on file  Occupational History   Not on file  Tobacco Use   Smoking status: Former    Packs/day: 1.50    Years: 35.00    Total pack years: 52.50    Types: Cigarettes    Quit date: 02/02/2021    Years since quitting: 1.3   Smokeless tobacco: Never  Vaping Use   Vaping Use: Never used  Substance and Sexual Activity   Alcohol use: Yes    Comment: occasional   Drug use: Never   Sexual activity: Not on file  Other Topics Concern   Not on file  Social History Narrative   Right handed   One story home   Drinks caffeine  Social Determinants of Health   Financial Resource Strain: Not on file  Food Insecurity: Not on file  Transportation Needs: Not on file  Physical Activity: Not on file  Stress: Not on file  Social Connections: Not on file  Intimate Partner Violence: Not on file    Family History  Family history unknown: Yes    ROS: no fevers or chills, productive cough, hemoptysis, dysphasia, odynophagia, melena, hematochezia, dysuria, hematuria, rash, seizure activity, orthopnea, PND, pedal edema, claudication. Remaining systems are negative.  Physical Exam: Well-developed well-nourished in no acute distress.  Skin is warm and dry.  HEENT is  normal.  Neck is supple.  Chest is clear to auscultation with normal expansion.  Cardiovascular exam is regular rate and rhythm.  2/6 systolic murmur left sternal border.  S2 is not diminished. Abdominal exam nontender or distended. No masses palpated. Extremities show no edema. neuro grossly intact  ECG-sinus bradycardia at a rate of 58, no ST changes.  Personally reviewed  A/P  1 coronary artery disease-plan to continue medical therapy with aspirin; he is intolerant to statins.  2 hyperlipidemia-patient is intolerant to statins.  Continue Zetia.  We will have most recent lipids and liver forwarded to [place] from primary care.  If LDL not at goal we will refer for consideration of Repatha, Praluent or inclisiran.  3 hypertension-blood pressure elevated;   however he follows this at home and it is controlled.  Continue present medications and follow.    4 aortic stenosis-moderate on last echocardiogram.  We will repeat study October 2023.  5 abdominal aortic aneurysm-plan follow-up ultrasound December 2023.  6 vascular disease-patient is followed by vascular surgery.  Continue aspirin and statin.  [name], MD

## 2022-06-04 DIAGNOSIS — M25511 Pain in right shoulder: Secondary | ICD-10-CM | POA: Diagnosis not present

## 2022-06-07 ENCOUNTER — Ambulatory Visit: Payer: Medicare Other | Attending: Cardiology | Admitting: Cardiology

## 2022-06-07 ENCOUNTER — Encounter: Payer: Self-pay | Admitting: Cardiology

## 2022-06-07 VITALS — BP 149/98 | HR 54 | Ht 71.0 in | Wt 268.6 lb

## 2022-06-07 DIAGNOSIS — I350 Nonrheumatic aortic (valve) stenosis: Secondary | ICD-10-CM

## 2022-06-07 DIAGNOSIS — I714 Abdominal aortic aneurysm, without rupture, unspecified: Secondary | ICD-10-CM

## 2022-06-07 DIAGNOSIS — I2510 Atherosclerotic heart disease of native coronary artery without angina pectoris: Secondary | ICD-10-CM | POA: Diagnosis not present

## 2022-06-07 DIAGNOSIS — I10 Essential (primary) hypertension: Secondary | ICD-10-CM

## 2022-06-07 DIAGNOSIS — E785 Hyperlipidemia, unspecified: Secondary | ICD-10-CM | POA: Diagnosis not present

## 2022-06-07 NOTE — Patient Instructions (Signed)
  Testing/Procedures:  Your physician has requested that you have an echocardiogram. Echocardiography is a painless test that uses sound waves to create images of your heart. It provides your doctor with information about the size and shape of your heart and how well your heart's chambers and valves are working. This procedure takes approximately one hour. There are no restrictions for this procedure. [place] has requested that you have an abdominal aorta duplex. During this test, an ultrasound is used to evaluate the aorta. Allow 30 minutes for this exam. Do not eat after midnight the day before and avoid carbonated beverages [facility] OFFICE-SCHEDULE IN DECEMBER   Follow-Up: At [facility], you and your health needs are our priority.  As part of our continuing mission to provide you with exceptional heart care, we have created designated Provider Care Teams.  These Care Teams include your primary Cardiologist (physician) and Advanced Practice Providers (APPs -  Physician Assistants and Nurse Practitioners) who all work together to provide you with the care you need, when you need it.  We recommend signing up for the patient portal called "MyChart".  Sign up information is provided on this After Visit Summary.  MyChart is used to connect with patients for Virtual Visits (Telemedicine).  Patients are able to view lab/test results, encounter notes, upcoming appointments, etc.  Non-urgent messages can be sent to your provider as well.   To learn more about what you can do with MyChart, go to [email or web address].    Your next appointment:   12 month(s)  The format for your next appointment:   In Person  Provider:   [name], MD

## 2022-06-08 DIAGNOSIS — D23122 Other benign neoplasm of skin of left lower eyelid, including canthus: Secondary | ICD-10-CM | POA: Diagnosis not present

## 2022-06-18 ENCOUNTER — Ambulatory Visit (HOSPITAL_COMMUNITY): Payer: Medicare Other | Attending: Cardiology

## 2022-06-18 DIAGNOSIS — I350 Nonrheumatic aortic (valve) stenosis: Secondary | ICD-10-CM | POA: Diagnosis not present

## 2022-06-18 LAB — ECHOCARDIOGRAM COMPLETE
AR max vel: 1.8 cm2
AV Area VTI: 1.8 cm2
AV Area mean vel: 1.93 cm2
AV Mean grad: 16 mmHg
AV Peak grad: 29.5 mmHg
Ao pk vel: 2.72 m/s
Area-P 1/2: 2.91 cm2
S' Lateral: 3.6 cm

## 2022-06-21 ENCOUNTER — Other Ambulatory Visit: Payer: Self-pay | Admitting: Cardiology

## 2022-06-21 DIAGNOSIS — I10 Essential (primary) hypertension: Secondary | ICD-10-CM

## 2022-06-21 DIAGNOSIS — I2510 Atherosclerotic heart disease of native coronary artery without angina pectoris: Secondary | ICD-10-CM

## 2022-06-22 ENCOUNTER — Encounter: Payer: Self-pay | Admitting: *Deleted

## 2022-08-15 ENCOUNTER — Ambulatory Visit (HOSPITAL_COMMUNITY)
Admission: RE | Admit: 2022-08-15 | Discharge: 2022-08-15 | Disposition: A | Discharge status: Home / Self Care | Payer: Medicare Other | Source: Ambulatory Visit | Attending: Cardiology | Admitting: Cardiology

## 2022-08-15 ENCOUNTER — Other Ambulatory Visit: Payer: Self-pay | Admitting: *Deleted

## 2022-08-15 DIAGNOSIS — I7143 Infrarenal abdominal aortic aneurysm, without rupture: Secondary | ICD-10-CM | POA: Diagnosis not present

## 2022-08-15 DIAGNOSIS — I714 Abdominal aortic aneurysm, without rupture, unspecified: Secondary | ICD-10-CM

## 2022-08-16 ENCOUNTER — Telehealth: Payer: Self-pay | Admitting: *Deleted

## 2022-08-16 ENCOUNTER — Other Ambulatory Visit: Payer: Self-pay | Admitting: Cardiology

## 2022-08-16 DIAGNOSIS — I10 Essential (primary) hypertension: Secondary | ICD-10-CM

## 2022-08-16 DIAGNOSIS — I2510 Atherosclerotic heart disease of native coronary artery without angina pectoris: Secondary | ICD-10-CM

## 2022-08-16 NOTE — Telephone Encounter (Signed)
-----   Message from Lelon Perla, MD sent at 08/15/2022  9:32 AM EST ----- Due to rate of growth will have pt see vascular surgery [name]

## 2022-08-16 NOTE — Telephone Encounter (Signed)
pt aware of results  He reports he sees dr Donzetta Matters at [facility]. Results forwarded to dr cain.

## 2022-09-07 ENCOUNTER — Other Ambulatory Visit: Payer: Self-pay | Admitting: Family Medicine

## 2022-09-07 ENCOUNTER — Ambulatory Visit (INDEPENDENT_AMBULATORY_CARE_PROVIDER_SITE_OTHER): Payer: Medicare Other

## 2022-09-07 DIAGNOSIS — R042 Hemoptysis: Secondary | ICD-10-CM

## 2022-09-07 DIAGNOSIS — R059 Cough, unspecified: Secondary | ICD-10-CM | POA: Diagnosis not present

## 2022-09-26 ENCOUNTER — Encounter: Payer: Self-pay | Admitting: Cardiology

## 2022-09-26 MED ORDER — AMLODIPINE BESYLATE 5 MG PO TABS: 5.0000 mg | ORAL_TABLET | Freq: Every day | ORAL | 3 refills | Status: DC

## 2022-12-17 ENCOUNTER — Telehealth: Payer: Self-pay | Admitting: Cardiology

## 2022-12-17 NOTE — Telephone Encounter (Signed)
Pt called stating that the pt normally gets a call from Dr. Ludwig Clarks nurse to schedule his yearly follow up. Pt has an appt on 04/17 [facility] and stated that he wants Dr. Ludwig Clarks nurse to send a Mychart message to him about scheduling once they have received his results from the [facility].

## 2022-12-18 ENCOUNTER — Encounter: Payer: Self-pay | Admitting: *Deleted

## 2022-12-18 NOTE — Telephone Encounter (Signed)
Mychart message sent to patient regarding appointment

## 2022-12-19 ENCOUNTER — Other Ambulatory Visit: Payer: Self-pay | Admitting: *Deleted

## 2022-12-19 DIAGNOSIS — I771 Stricture of artery: Secondary | ICD-10-CM

## 2022-12-20 NOTE — Telephone Encounter (Signed)
Follow up scheduled

## 2022-12-24 NOTE — Progress Notes (Signed)
HPI: Follow-up coronary artery disease. Had non-ST elevation myocardial infarction May 2022. Cardiac catheterization May 2022 showed occluded right coronary artery, 50% first diagonal, 30% second diagonal 25% circumflex; moderate aortic stenosis with mean gradient 30 mmHg. CTA March 2023 showed 75% stenosis of the right brachiocephalic artery origin, 40 to 16% left common carotid artery and ulcerated atherosclerotic plaque in the distal aortic arch.  Echocardiogram October 2023 showed normal LV function, grade 1 diastolic dysfunction, mild aortic stenosis with mean gradient 16 mmHg and mildly dilated aortic root at 39 mm.  Abdominal ultrasound December 2023 showed 4.5 cm abdominal aortic aneurysm increased in size from 3.8 cm the previous year and dilated iliacs.  Since last seen, the patient has dyspnea with more extreme activities but not with routine activities. It is relieved with rest. It is not associated with chest pain. There is no orthopnea, PND or pedal edema. There is no syncope or palpitations. There is no exertional chest pain.   Current Outpatient Medications  Medication Sig Dispense Refill   amLODipine (NORVASC) 5 MG tablet Take 1 tablet (5 mg total) by mouth daily. 90 tablet 3   aspirin 81 MG chewable tablet Chew 81 mg by mouth daily.     ezetimibe (ZETIA) 10 MG tablet TAKE 1 TABLET BY MOUTH DAILY 60 tablet 5   famotidine (PEPCID) 20 MG tablet Take 20 mg by mouth 2 (two) times daily.     labetalol (NORMODYNE) 100 MG tablet TAKE 1 TABLET BY MOUTH TWICE  DAILY 120 tablet 5   olmesartan (BENICAR) 40 MG tablet TAKE 1 TABLET BY MOUTH DAILY 60 tablet 5   furosemide (LASIX) 40 MG tablet Take 1 tablet (40 mg total) by mouth daily. 90 tablet 0   No current facility-administered medications for this visit.     Past Medical History:  Diagnosis Date   Arthritis    hands   BMI 34.0-34.9,adult    Coronary artery disease    Dyspnea 02/07/2021   Headache    HTN (hypertension)     Hyperlipidemia    Pneumonia 02/07/2021    Past Surgical History:  Procedure Laterality Date   CYST REMOVAL HAND Right 2018   gallbladder removed  2008   LEFT HEART CATH AND CORONARY ANGIOGRAPHY N/A 02/07/2021   Procedure: LEFT HEART CATH AND CORONARY ANGIOGRAPHY;  Surgeon: Swaziland, Peter M, MD;  Location: [facility] INVASIVE CV LAB;  Service: Cardiovascular;  Laterality: N/A;   SHOULDER ARTHROSCOPY WITH ROTATOR CUFF REPAIR Right 03/14/2021   Procedure: Right shoulder arthroscopy, subacromial decompression, distal clavicle resection, rotator cuff repair, bicep tenodesis;  Surgeon: Francena Hanly, MD;  Location: [facility];  Service: Orthopedics;  Laterality: Right;     Social History   Socioeconomic History   Marital status: Married    Spouse name: Not on file   Number of children: 2   Years of education: 12   Highest education level: Not on file  Occupational History   Not on file  Tobacco Use   Smoking status: Former    Packs/day: 1.50    Years: 35.00    Additional pack years: 0.00    Total pack years: 52.50    Types: Cigarettes    Quit date: 02/02/2021    Years since quitting: 1.9   Smokeless tobacco: Never  Vaping Use   Vaping Use: Never used  Substance and Sexual Activity   Alcohol use: Yes    Comment: occasional   Drug use: Never   Sexual activity: Not  on file  Other Topics Concern   Not on file  Social History Narrative   Right handed   One story home   Drinks caffeine   Social Determinants of Health   Financial Resource Strain: Not on file  Food Insecurity: Not on file  Transportation Needs: Not on file  Physical Activity: Not on file  Stress: Not on file  Social Connections: Not on file  Intimate Partner Violence: Not on file    Family History  Family history unknown: Yes    ROS: no fevers or chills, productive cough, hemoptysis, dysphasia, odynophagia, melena, hematochezia, dysuria, hematuria, rash, seizure activity, orthopnea, PND, pedal edema,  claudication. Remaining systems are negative.  Physical Exam: Well-developed well-nourished in no acute distress.  Skin is warm and dry.  HEENT is normal.  Neck is supple.  Chest is clear to auscultation with normal expansion.  Cardiovascular exam is regular rate and rhythm.  2/6 systolic murmur left sternal border.  No diastolic murmur noted. Abdominal exam nontender or distended. No masses palpated. Extremities show no edema. neuro grossly intact   A/P  1 coronary artery disease-patient denies chest pain.  Continue aspirin.  He is intolerant to statins.  2 abdominal aortic aneurysm-patient needs follow-up with vascular surgery given the size of recent growth of his aneurysm.  We will arrange.  3 hyperlipidemia-intolerant to statins.  Continue Zetia.  4 hypertension-patient's blood pressure is elevated; however it is typically controlled at home.  Will follow and advance medications if needed.  5 aortic stenosis-most recent echocardiogram showed mild aortic stenosis.  However the previous study showed moderate.  We will plan follow-up echocardiogram October 2024.  6 vascular disease-Per vascular surgery.  Olga Millers, MD

## 2022-12-26 ENCOUNTER — Ambulatory Visit: Payer: Medicare Other | Admitting: Physician Assistant

## 2022-12-26 ENCOUNTER — Ambulatory Visit (HOSPITAL_COMMUNITY)
Admission: RE | Admit: 2022-12-26 | Discharge: 2022-12-26 | Disposition: A | Discharge status: Home / Self Care | Payer: Medicare Other | Source: Ambulatory Visit | Attending: Vascular Surgery | Admitting: Vascular Surgery

## 2022-12-26 VITALS — BP 107/76 | HR 58 | Temp 97.8°F | Wt 275.0 lb

## 2022-12-26 DIAGNOSIS — I6523 Occlusion and stenosis of bilateral carotid arteries: Secondary | ICD-10-CM | POA: Diagnosis not present

## 2022-12-26 DIAGNOSIS — I771 Stricture of artery: Secondary | ICD-10-CM | POA: Insufficient documentation

## 2022-12-26 NOTE — Progress Notes (Signed)
Office Note     CC:  follow up Requesting Provider:  Joycelyn Rua, MD  HPI: [name] is a 67 y.o. 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  He was previously seen by Dr. Randie Heinz in March 2023.  At that time based on CT imaging both areas of stenosis were stable.  Since office visit he denies any neurological events including slurring speech, changes in vision, or one-sided weakness.  He has history of right rotator cuff surgery and has chronic weakness of the right shoulder however denies any drop attacks or right arm claudication.  In the past he has only checked blood pressures in his left arm.  He is a former smoker who quit 2 years ago.  He is on an aspirin daily.  He has a statin intolerance and is on Zetia.  He follows regularly with his cardiologist Dr. Jens Som given history of MI.  It should also be noted that he has a AAA which is followed by his cardiologist.  Chart review demonstrates a 4.5 cm AAA on duplex in December 2023.   Past Medical History:  Diagnosis Date   Arthritis    hands   BMI 34.0-34.9,adult    Coronary artery disease    Dyspnea 02/07/2021   Headache    HTN (hypertension)    Hyperlipidemia    Pneumonia 02/07/2021    Past Surgical History:  Procedure Laterality Date   CYST REMOVAL HAND Right 2018   gallbladder removed  2008   LEFT HEART CATH AND CORONARY ANGIOGRAPHY N/A 02/07/2021   Procedure: LEFT HEART CATH AND CORONARY ANGIOGRAPHY;  Surgeon: Swaziland, Peter M, MD;  Location: [facility] INVASIVE CV LAB;  Service: Cardiovascular;  Laterality: N/A;   SHOULDER ARTHROSCOPY WITH ROTATOR CUFF REPAIR Right 03/14/2021   Procedure: Right shoulder arthroscopy, subacromial decompression, distal clavicle resection, rotator cuff repair, bicep tenodesis;  Surgeon: Francena Hanly, MD;  Location: [facility];  Service: Orthopedics;  Laterality: Right;     Social History    Socioeconomic History   Marital status: Married    Spouse name: Not on file   Number of children: 2   Years of education: 12   Highest education level: Not on file  Occupational History   Not on file  Tobacco Use   Smoking status: Former    Packs/day: 1.50    Years: 35.00    Additional pack years: 0.00    Total pack years: 52.50    Types: Cigarettes    Quit date: 02/02/2021    Years since quitting: 1.8   Smokeless tobacco: Never  Vaping Use   Vaping Use: Never used  Substance and Sexual Activity   Alcohol use: Yes    Comment: occasional   Drug use: Never   Sexual activity: Not on file  Other Topics Concern   Not on file  Social History Narrative   Right handed   One story home   Drinks caffeine   Social Determinants of Health   Financial Resource Strain: Not on file  Food Insecurity: Not on file  Transportation Needs: Not on file  Physical Activity: Not on file  Stress: Not on file  Social Connections: Not on file  Intimate Partner Violence: Not on file    Family History  Family history unknown: Yes    Current Outpatient Medications  Medication Sig Dispense Refill   amLODipine (NORVASC) 5 MG tablet Take 1 tablet (5 mg total)  by mouth daily. 90 tablet 3   aspirin 81 MG chewable tablet Chew 81 mg by mouth daily.     ezetimibe (ZETIA) 10 MG tablet TAKE 1 TABLET BY MOUTH DAILY 60 tablet 5   famotidine (PEPCID) 20 MG tablet Take 20 mg by mouth 2 (two) times daily.     labetalol (NORMODYNE) 100 MG tablet TAKE 1 TABLET BY MOUTH TWICE  DAILY 120 tablet 5   olmesartan (BENICAR) 40 MG tablet TAKE 1 TABLET BY MOUTH DAILY 60 tablet 5   furosemide (LASIX) 40 MG tablet Take 1 tablet (40 mg total) by mouth daily. 90 tablet 0   No current facility-administered medications for this visit.    Allergies  Allergen Reactions   Lovastatin     PT CANNOT TAKE STATINS, SEVERE MYALGIAS     REVIEW OF SYSTEMS:    denotes positive finding,  denotes negative  finding Cardiac  Comments:  Chest pain or chest pressure:    Shortness of breath upon exertion:    Short of breath when lying flat:    Irregular heart rhythm:        Vascular    Pain in calf, thigh, or hip brought on by ambulation:    Pain in feet at night that wakes you up from your sleep:     Blood clot in your veins:    Leg swelling:         Pulmonary    Oxygen at home:    Productive cough:     Wheezing:         Neurologic    Sudden weakness in arms or legs:     Sudden numbness in arms or legs:     Sudden onset of difficulty speaking or slurred speech:    Temporary loss of vision in one eye:     Problems with dizziness:         Gastrointestinal    Blood in stool:     Vomited blood:         Genitourinary    Burning when urinating:     Blood in urine:        Psychiatric    Major depression:         Hematologic    Bleeding problems:    Problems with blood clotting too easily:        Skin    Rashes or ulcers:        Constitutional    Fever or chills:      PHYSICAL EXAMINATION:  Vitals:   12/26/22 0940 12/26/22 0942  BP: 123/72 107/76  Pulse: (!) 58   Temp: 97.8 F (36.6 C)   TempSrc: Temporal   SpO2: 95%   Weight: 275 lb (124.7 kg)     General:  WDWN in NAD; vital signs documented above Gait: Not observed HENT: WNL, normocephalic Pulmonary: normal non-labored breathing Cardiac: regular HR Skin: without rashes Vascular Exam/Pulses: Left radial pulse slightly stronger than right Extremities: without ischemic changes, without Gangrene , without cellulitis; without open wounds;  Musculoskeletal: no muscle wasting or atrophy  Neurologic: A&O X 3; CN grossly intact Psychiatric:  The pt has Normal affect.   Non-Invasive Vascular Imaging:   40 to 59% right ICA Normal flow dynamics noted in the right subclavian artery   Left ICA 1 to 39%; unable to visualize common carotid stenosis noted on CT    ASSESSMENT/PLAN:: 67 y.o. male here for follow up  for surveillance of right innominate stenosis as  well as carotid artery stenosis  -Subjectively, patient denies any neurological events since last office visit with Dr. Randie Heinz 1 year ago.  He also denies any drop attacks or right arm claudication.  Carotid duplex demonstrates normal flow dynamics in the right subclavian and right carotid artery suggesting a stable right innominate stenosis.  Normal flow dynamics were also seen in the left cervical carotid suggesting a stable left carotid origin stenosis.  Continue aspirin daily.  Patient will follow-up in another year with a carotid duplex.  He should record blood pressure readings from both arms when taking blood pressure at home.  It should also be noted that patient was upset about not seeing Dr. Randie Heinz in office today.  I explained to the patient that PAs typically see surveillance patients if deemed appropriate after initial evaluation by the MD.  He then stated if he is unable to be seen by Dr. Randie Heinz for ongoing surveillance, he will find a different vascular surgery office.  We will schedule him with Dr. Randie Heinz for at least the next office visit.   Emilie Rutter, PA-C [facility] [phone]  Clinic MD:   Randie Heinz

## 2023-01-03 ENCOUNTER — Encounter: Payer: Self-pay | Admitting: Cardiology

## 2023-01-03 ENCOUNTER — Ambulatory Visit: Payer: Medicare Other | Attending: Cardiology | Admitting: Cardiology

## 2023-01-03 VITALS — BP 167/91 | HR 70 | Ht 71.0 in | Wt 278.8 lb

## 2023-01-03 DIAGNOSIS — I350 Nonrheumatic aortic (valve) stenosis: Secondary | ICD-10-CM | POA: Diagnosis not present

## 2023-01-03 DIAGNOSIS — I2510 Atherosclerotic heart disease of native coronary artery without angina pectoris: Secondary | ICD-10-CM

## 2023-01-03 DIAGNOSIS — I10 Essential (primary) hypertension: Secondary | ICD-10-CM

## 2023-01-03 DIAGNOSIS — E785 Hyperlipidemia, unspecified: Secondary | ICD-10-CM

## 2023-01-03 DIAGNOSIS — I714 Abdominal aortic aneurysm, without rupture, unspecified: Secondary | ICD-10-CM

## 2023-01-03 NOTE — Patient Instructions (Signed)
    Follow-Up: At [facility], you and your health needs are our priority.  As part of our continuing mission to provide you with exceptional heart care, we have created designated Provider Care Teams.  These Care Teams include your primary Cardiologist (physician) and Advanced Practice Providers (APPs -  Physician Assistants and Nurse Practitioners) who all work together to provide you with the care you need, when you need it.  We recommend signing up for the patient portal called "MyChart".  Sign up information is provided on this After Visit Summary.  MyChart is used to connect with patients for Virtual Visits (Telemedicine).  Patients are able to view lab/test results, encounter notes, upcoming appointments, etc.  Non-urgent messages can be sent to your provider as well.   To learn more about what you can do with MyChart, go to [email or web address].    Your next appointment:   6 month(s)  Provider:   Brian Crenshaw, MD      

## 2023-01-04 ENCOUNTER — Telehealth: Payer: Self-pay

## 2023-01-04 ENCOUNTER — Telehealth: Payer: Self-pay | Admitting: *Deleted

## 2023-01-04 DIAGNOSIS — I714 Abdominal aortic aneurysm, without rupture, unspecified: Secondary | ICD-10-CM

## 2023-01-04 NOTE — Telephone Encounter (Signed)
Debra at Dr. Ludwig Clarks office called requesting a return call.  Reviewed pt's chart, returned call for clarification, two identifiers used. Pt has known AAA measuring 4.5 cm via imaging on 08/15/22. Pt had f/u on 01/03/23 and cardiology wants pt to be seen in this office with Dr. Randie Heinz. Since pt has only been seen here for carotid, requested referral to be placed. Will work on scheduling pt for early May per Dr. Darcella Cheshire conversation with Dr. Jens Som. Confirmed understanding. Referral placed in system.

## 2023-01-04 NOTE — Telephone Encounter (Signed)
-----   Message from Lewayne Bunting, MD sent at 01/04/2023  7:14 AM EDT ----- Regarding: RE: [name] Can you schedule him to see Dr Randie Heinz? ----- Message ----- From: Maeola Harman, MD Sent: 01/03/2023   8:26 PM EDT To: Lewayne Bunting, MD Subject: RE:                                            Looks like I need to see him next month with another duplex.   [name] ----- Message ----- From: Lewayne Bunting, MD Sent: 01/03/2023   5:01 PM EDT To: Maeola Harman, MD  [name] [name] had a follow-up abdominal ultrasound in our office and his abdominal aortic aneurysm had increased from 3.8 cm to 4.5 cm.  We arranged an office visit but the app that saw him focused on his carotids.  Can you review his chart?  I would be happy to arrange CTA or have him see you.  I am concerned about the rapidity of the growth of his abdominal aortic aneurysm. Thanks [name]

## 2023-01-04 NOTE — Telephone Encounter (Signed)
Spoke with angel, triage nurse at [facility]. Referral placed for patient to see dr Randie Heinz for AAA. They will contact the patient.

## 2023-01-04 NOTE — Telephone Encounter (Signed)
Left message for [facility] to call

## 2023-01-07 ENCOUNTER — Other Ambulatory Visit: Payer: Self-pay

## 2023-01-07 DIAGNOSIS — I7143 Infrarenal abdominal aortic aneurysm, without rupture: Secondary | ICD-10-CM

## 2023-01-14 DIAGNOSIS — E782 Mixed hyperlipidemia: Secondary | ICD-10-CM | POA: Diagnosis not present

## 2023-01-14 DIAGNOSIS — I2510 Atherosclerotic heart disease of native coronary artery without angina pectoris: Secondary | ICD-10-CM | POA: Diagnosis not present

## 2023-01-14 DIAGNOSIS — I10 Essential (primary) hypertension: Secondary | ICD-10-CM | POA: Diagnosis not present

## 2023-01-14 DIAGNOSIS — Z0000 Encounter for general adult medical examination without abnormal findings: Secondary | ICD-10-CM | POA: Diagnosis not present

## 2023-01-14 DIAGNOSIS — I700 Atherosclerosis of aorta: Secondary | ICD-10-CM | POA: Diagnosis not present

## 2023-01-14 DIAGNOSIS — I714 Abdominal aortic aneurysm, without rupture, unspecified: Secondary | ICD-10-CM | POA: Diagnosis not present

## 2023-01-14 DIAGNOSIS — G720 Drug-induced myopathy: Secondary | ICD-10-CM | POA: Diagnosis not present

## 2023-01-14 LAB — MICROALBUMIN / CREATININE URINE RATIO
Creatinine, POC: 54 mg/dL
Microalb Creat Ratio: 13
Microalbumin, Urine: 0.7

## 2023-01-14 LAB — COMPREHENSIVE METABOLIC PANEL: EGFR: 74

## 2023-01-16 ENCOUNTER — Ambulatory Visit: Payer: Medicare Other | Admitting: Vascular Surgery

## 2023-01-16 ENCOUNTER — Encounter: Payer: Self-pay | Admitting: *Deleted

## 2023-01-16 ENCOUNTER — Ambulatory Visit (HOSPITAL_COMMUNITY)
Admission: RE | Admit: 2023-01-16 | Discharge: 2023-01-16 | Disposition: A | Discharge status: Home / Self Care | Payer: Medicare Other | Source: Ambulatory Visit | Attending: Vascular Surgery | Admitting: Vascular Surgery

## 2023-01-16 ENCOUNTER — Encounter: Payer: Self-pay | Admitting: Vascular Surgery

## 2023-01-16 VITALS — BP 128/79 | HR 56 | Temp 98.0°F | Resp 20 | Ht 71.0 in | Wt 276.0 lb

## 2023-01-16 DIAGNOSIS — I6523 Occlusion and stenosis of bilateral carotid arteries: Secondary | ICD-10-CM | POA: Diagnosis not present

## 2023-01-16 DIAGNOSIS — I7143 Infrarenal abdominal aortic aneurysm, without rupture: Secondary | ICD-10-CM

## 2023-01-16 NOTE — Progress Notes (Signed)
Patient ID: [name], male   DOB: 05/17/1956, 67 y.o.   MRN: [id number]  Reason for Consult: Follow-up   Referred by Lewayne Bunting, MD  Subjective:     HPI:  [name] is a 67 y.o. male history of right abdominal artery stenosis as well as left common carotid artery stenosis and known abdominal aortic aneurysm.  He denies any new back or abdominal pain.  He denies any stroke, TIA or amaurosis.  This was initially found when he was admitted with pneumonia a couple years ago in the hospital.  He is followed by Dr. Jens Som.  He takes aspirin and is on Zetia as he cannot tolerate statins.  No complaints related to today's office visit.  Past Medical History:  Diagnosis Date   Arthritis    hands   BMI 34.0-34.9,adult    Coronary artery disease    Dyspnea 02/07/2021   Headache    HTN (hypertension)    Hyperlipidemia    Pneumonia 02/07/2021   Family History  Family history unknown: Yes   Past Surgical History:  Procedure Laterality Date   CYST REMOVAL HAND Right 2018   gallbladder removed  2008   LEFT HEART CATH AND CORONARY ANGIOGRAPHY N/A 02/07/2021   Procedure: LEFT HEART CATH AND CORONARY ANGIOGRAPHY;  Surgeon: Swaziland, Peter M, MD;  Location: [facility] INVASIVE CV LAB;  Service: Cardiovascular;  Laterality: N/A;   SHOULDER ARTHROSCOPY WITH ROTATOR CUFF REPAIR Right 03/14/2021   Procedure: Right shoulder arthroscopy, subacromial decompression, distal clavicle resection, rotator cuff repair, bicep tenodesis;  Surgeon: Francena Hanly, MD;  Location: [facility];  Service: Orthopedics;  Laterality: Right;     Short Social History:  Social History   Tobacco Use   Smoking status: Former    Packs/day: 1.50    Years: 35.00    Additional pack years: 0.00    Total pack years: 52.50    Types: Cigarettes    Quit date: 02/02/2021    Years since quitting: 1.9   Smokeless tobacco: Never  Substance Use Topics   Alcohol use: Yes    Comment: occasional    Allergies   Allergen Reactions   Lovastatin     PT CANNOT TAKE STATINS, SEVERE MYALGIAS    Current Outpatient Medications  Medication Sig Dispense Refill   amLODipine (NORVASC) 5 MG tablet Take 1 tablet (5 mg total) by mouth daily. 90 tablet 3   aspirin 81 MG chewable tablet Chew 81 mg by mouth daily.     ezetimibe (ZETIA) 10 MG tablet TAKE 1 TABLET BY MOUTH DAILY 60 tablet 5   famotidine (PEPCID) 20 MG tablet Take 20 mg by mouth 2 (two) times daily.     labetalol (NORMODYNE) 100 MG tablet TAKE 1 TABLET BY MOUTH TWICE  DAILY 120 tablet 5   olmesartan (BENICAR) 40 MG tablet TAKE 1 TABLET BY MOUTH DAILY 60 tablet 5   furosemide (LASIX) 40 MG tablet Take 1 tablet (40 mg total) by mouth daily. 90 tablet 0   No current facility-administered medications for this visit.    Review of Systems  Constitutional:  Constitutional negative. HENT: HENT negative.  Eyes: Eyes negative.  Respiratory: Respiratory negative.  Cardiovascular: Cardiovascular negative.  GI: Gastrointestinal negative.  Musculoskeletal: Musculoskeletal negative.  Skin: Skin negative.  Neurological: Neurological negative. Hematologic: Hematologic/lymphatic negative.  Psychiatric: Psychiatric negative.        Objective:  Objective   Vitals:   01/16/23 0859  BP: 128/79  Pulse: (!) 56  Resp: 20  Temp: 98 F (36.7 C)  SpO2: 95%  Weight: 276 lb (125.2 kg)  Height: 5\' 11"  (1.803 m)   Body mass index is 38.49 kg/m.  Physical Exam Constitutional:      Appearance: He is obese.  HENT:     Head: Normocephalic.     Nose: Nose normal.  Eyes:     Pupils: Pupils are equal, round, and reactive to light.  Neck:     Vascular: No carotid bruit.  Cardiovascular:     Rate and Rhythm: Normal rate.     Pulses: Normal pulses.  Pulmonary:     Effort: Pulmonary effort is normal.  Abdominal:     General: Abdomen is flat.  Musculoskeletal:     Right lower leg: Edema present.     Left lower leg: Edema present.  Skin:    General:  Skin is warm.     Capillary Refill: Capillary refill takes less than 2 seconds.  Neurological:     General: No focal deficit present.     Mental Status: He is alert.  Psychiatric:        Mood and Affect: Mood normal.        Thought Content: Thought content normal.        Judgment: Judgment normal.     Data: Abdominal Aorta Findings:  +-----------+-------+----------+----------+---------+--------+--------+  Location  AP (cm)Trans (cm)PSV (cm/s)Waveform ThrombusComments  +-----------+-------+----------+----------+---------+--------+--------+  Proximal  3.54   3.14      54        biphasic                   +-----------+-------+----------+----------+---------+--------+--------+  Mid       4.32   4.40      58        biphasic         fusiform  +-----------+-------+----------+----------+---------+--------+--------+  Distal    3.51   3.93      50        biphasic         fusiform  +-----------+-------+----------+----------+---------+--------+--------+  RT CIA Prox1.5    1.8       109       triphasic                  +-----------+-------+----------+----------+---------+--------+--------+  LT CIA Prox1.7    1.9       63        biphasic                   +-----------+-------+----------+----------+---------+--------+--------+  LT EIA Prox                 265       biphasic                   +-----------+-------+----------+----------+---------+--------+--------+         Summary:  Abdominal Aorta: There is evidence of abnormal dilatation of the mid,  distal and proximal Abdominal aorta. The largest aortic measurement is 4.4  cm. The largest aortic diameter remains essentially unchanged compared to  prior exam. Previous diameter  measurement was 4.5 cm obtained on 08/15/2022.  Stenosis: +-------------------+-------------+  Location           Stenosis       +-------------------+-------------+  Left External Iliac>50% stenosis   +-------------------+-------------+       Assessment/Plan:    67 year old male with known carotid artery stenosis as well as innominate artery stenosis and small abdominal aortic aneurysm now measuring  less than 4.5 cm.  Apparently at his last evaluation he had not fasted when the 4.5 cm size was identified.  Given the adequate imaging to measure 4.4 today as well as minimal carotid stenosis identified on duplex we will have him follow-up in 1 year with repeat studies.  We discussed the signs and symptoms of stroke, TIA or amaurosis and also discussed the signs and symptoms of aneurysm rupture and he demonstrates good understanding, short of this he will follow-up in 1 year as above.     Maeola Harman MD [facility]

## 2023-01-23 ENCOUNTER — Telehealth: Payer: Self-pay | Admitting: Cardiology

## 2023-01-23 NOTE — Telephone Encounter (Signed)
Caller wants to know if patient is on statin therapy. 

## 2023-01-23 NOTE — Telephone Encounter (Signed)
Called to [employer].  Held for approx 4 minutes.  LVM to return call to our office   

## 2023-01-23 NOTE — Telephone Encounter (Signed)
Left voicemail to return call to office.

## 2023-01-23 NOTE — Telephone Encounter (Signed)
Pat returning call, she states you can leave detailed message since her vm is very secure. She states she just needs to know if pt is on a statin or allergic to statins.

## 2023-01-25 NOTE — Telephone Encounter (Signed)
Spoke with [employer] with [employer].  She states that there is no information to be seen in his chart.  She states if they have made contact 3x they will then take out of system.  Advised they called once and we LVM to return call.  She states she cannot see information so that we are to disregard the request at this time. Nothing further needed at this time.

## 2023-01-28 ENCOUNTER — Other Ambulatory Visit: Payer: Self-pay

## 2023-01-28 DIAGNOSIS — I6523 Occlusion and stenosis of bilateral carotid arteries: Secondary | ICD-10-CM

## 2023-01-28 DIAGNOSIS — I7143 Infrarenal abdominal aortic aneurysm, without rupture: Secondary | ICD-10-CM

## 2023-02-21 ENCOUNTER — Telehealth: Payer: Self-pay | Admitting: Pharmacist Clinician (PhC)/ Clinical Pharmacy Specialist

## 2023-02-21 ENCOUNTER — Ambulatory Visit
Payer: Medicare Other | Attending: Internal Medicine | Admitting: Pharmacist Clinician (PhC)/ Clinical Pharmacy Specialist

## 2023-02-21 ENCOUNTER — Encounter: Payer: Self-pay | Admitting: Pharmacist Clinician (PhC)/ Clinical Pharmacy Specialist

## 2023-02-21 DIAGNOSIS — E7849 Other hyperlipidemia: Secondary | ICD-10-CM | POA: Diagnosis not present

## 2023-02-21 NOTE — Assessment & Plan Note (Signed)
Assessment: Patient with ASCVD not at LDL goal of < 70 Most recent LDL 94 on 01/14/23 Has been compliant with ezetimibe : 10 mg Not able to tolerate statins secondary to myalgias - lovastatin, pravastatin, rosuvastatin Reviewed options for lowering LDL cholesterol, including PCSK-9 inhibitors, bempedoic acid and inclisiran.  Discussed mechanisms of action, dosing, side effects, potential decreases in LDL cholesterol and costs.  Also reviewed potential options for patient assistance.  Plan: Patient agreeable to starting Repatha Repeat labs after:  3 months Lipid Liver function Patient was given information on [employer] - will sign patient up when PA approved Marital status - married Income < $72,000 (single) or < $102,000 (married) -yes

## 2023-02-21 NOTE — Patient Instructions (Signed)
Your Results:             Your most recent labs Goal  Total Cholesterol 163 < 200  Triglycerides 141 < 150  HDL (happy/good cholesterol) 45 > 40  LDL (lousy/bad cholesterol 94 < 70   Medication changes:  We will start the process to get Repatha covered by your insurance.  Once the prior authorization is complete, I will call/send a MyChart message to let you know and confirm pharmacy information.   You will take one injection every 14 days   Lab orders:  We want to repeat labs after 2-3 months.  We will send you a lab order to remind you once we get closer to that time.    Patient Assistance:    We will sign you up for a Healthwell Grant once your medication is approved by [employer].  I will call you with the ID number, then you will take this information to the pharmacy.  They will bill it after your insurance, bringing your copay to $0.  The grant will pay the first $2,500 in a one year period.    ID   BIN [id number]  PCN [id number]  GRP [id number]    Thank you for choosing [facility]

## 2023-02-21 NOTE — Telephone Encounter (Signed)
Please do PA for Repatha 

## 2023-02-21 NOTE — Progress Notes (Signed)
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 male patient of Dr Jens Som, in the office today to discuss options for cholesterol management.    Insurance Carrier: [employer] [id number]  $47/month  $142 in coverage gap  LDL Cholesterol goal:  LDL < 70  Current Medications: ezetimibe   Previously tried:  lovastatin, pravastatin, rosuvastatin - myalgias  Family Hx:  mother died cancer, father alcoholism , pgp died heart issues - gm aneurysm(?), gf MI in 81's; 4 siblings - one brother MI; 2 sons, no issues as yet   Social Hx: Tobacco: no - quit about 2-3 years ago Alcohol: only on occasion   Diet:  mix of home and eating out; lots of [employer]; home includes regular salads, often with a protein on it; stir fry, fried rice,      Exercise: shoulder injury 2 years ago, did PT/ mobility exercises after, trouble with hips, so not able to go long distances (5 min/time)   Accessory Clinical Findings   01/14/2023 - labs in CareEverywhere - TC 163, TG 141, HDL 45, LDL 94  Lab Results  Component Value Date   CHOL 149 08/10/2021   HDL 43 08/10/2021   LDLCALC 80 08/10/2021   TRIG 150 (H) 08/10/2021   CHOLHDL 3.5 08/10/2021    Lab Results  Component Value Date   ALT 26 08/10/2021   AST 26 08/10/2021   ALKPHOS 86 08/10/2021   BILITOT 0.6 08/10/2021   Lab Results  Component Value Date   CREATININE 1.10 08/10/2021   BUN 10 08/10/2021   NA 139 08/10/2021   K 4.3  08/10/2021   CL 102 08/10/2021   CO2 26 08/10/2021   No results found for: "HGBA1C"  Home Medications    Current Outpatient Medications  Medication Sig Dispense Refill   amLODipine (NORVASC) 5 MG tablet Take 1 tablet (5 mg total) by mouth daily. 90 tablet 3   aspirin 81 MG chewable tablet Chew 81 mg by mouth daily.     ezetimibe (ZETIA) 10 MG tablet TAKE 1 TABLET BY MOUTH DAILY 60 tablet 5   famotidine (PEPCID) 20 MG tablet Take 20 mg by mouth 2 (two) times daily.     furosemide (LASIX) 40 MG tablet Take 1 tablet (40 mg total) by mouth daily. 90 tablet 0   labetalol (NORMODYNE) 100 MG tablet TAKE 1 TABLET BY MOUTH TWICE  DAILY 120 tablet 5   olmesartan (BENICAR) 40 MG tablet TAKE 1 TABLET BY MOUTH DAILY 60 tablet 5   No current facility-administered medications for this visit.     Assessment & Plan    Hyperlipidemia Assessment: Patient with ASCVD not at LDL goal of < 70 Most recent LDL 94 on 01/14/23 Has been compliant with ezetimibe : 10 mg Not able to tolerate statins secondary to myalgias - lovastatin, pravastatin, rosuvastatin Reviewed options for lowering LDL cholesterol, including PCSK-9 inhibitors, bempedoic acid and inclisiran.  Discussed mechanisms of action, dosing, side effects, potential decreases in LDL cholesterol and costs.  Also reviewed potential options for patient assistance.  Plan: Patient agreeable to starting Repatha Repeat labs after:  3 months Lipid Liver function Patient was given information on [employer] - will sign patient up when PA approved Marital status - married Income < $72,000 (single) or < $102,000 (married) -yes   Phillips Hay, PharmD CPP [facility] [address]  [place], [place] [address] [phone]  02/21/2023, 1:05 PM

## 2023-02-22 ENCOUNTER — Other Ambulatory Visit (HOSPITAL_COMMUNITY): Payer: Self-pay

## 2023-02-22 ENCOUNTER — Telehealth: Payer: Self-pay

## 2023-02-22 NOTE — Telephone Encounter (Signed)
Pharmacy Patient Advocate Encounter   Received notification from [facility] that prior authorization for REPATHA is required/requested.   PA submitted to [facility] via CoverMyMeds Key or [facility]) confirmation # [id number]  Status is pending

## 2023-02-25 NOTE — Telephone Encounter (Signed)
Pharmacy Patient Advocate Encounter  Prior Authorization for REPATHA has been APPROVED by [facility] from 6.14.24 to 12.14.24.

## 2023-02-25 NOTE — Telephone Encounter (Signed)
Healthwell grant approved to 01/25/24  ID      [id number] BIN    [id number] PCN   [id number] GRP   [id number]  [facility] for patient to call with pharmacy information.

## 2023-02-26 ENCOUNTER — Other Ambulatory Visit (HOSPITAL_COMMUNITY): Payer: Self-pay

## 2023-02-26 MED ORDER — REPATHA SURECLICK 140 MG/ML ~~LOC~~ SOAJ
140.0000 mg | SUBCUTANEOUS | 3 refills | Status: DC
  Filled 2023-02-26 – 2023-02-28 (×3): qty 6, 84d supply, fill #0
  Filled 2023-05-11: qty 6, 84d supply, fill #1
  Filled 2023-07-30: qty 6, 84d supply, fill #2
  Filled 2023-10-25 – 2023-10-28 (×2): qty 6, 84d supply, fill #3

## 2023-02-26 NOTE — Telephone Encounter (Signed)
Spoke with patient - he will use [facility] mail order pharmacy, sent prescription with healthwell grant

## 2023-02-28 ENCOUNTER — Other Ambulatory Visit: Payer: Self-pay

## 2023-02-28 ENCOUNTER — Other Ambulatory Visit (HOSPITAL_COMMUNITY): Payer: Self-pay

## 2023-04-16 ENCOUNTER — Other Ambulatory Visit (HOSPITAL_COMMUNITY): Payer: Self-pay

## 2023-04-16 ENCOUNTER — Other Ambulatory Visit (HOSPITAL_BASED_OUTPATIENT_CLINIC_OR_DEPARTMENT_OTHER): Payer: Self-pay

## 2023-05-01 ENCOUNTER — Telehealth: Payer: Self-pay | Admitting: Pharmacist Clinician (PhC)/ Clinical Pharmacy Specialist

## 2023-05-01 ENCOUNTER — Other Ambulatory Visit: Payer: Self-pay

## 2023-05-01 DIAGNOSIS — E7849 Other hyperlipidemia: Secondary | ICD-10-CM | POA: Diagnosis not present

## 2023-05-01 NOTE — Telephone Encounter (Signed)
Updated lab orders.

## 2023-05-02 ENCOUNTER — Telehealth: Payer: Self-pay | Admitting: Cardiology

## 2023-05-02 ENCOUNTER — Encounter: Payer: Self-pay | Admitting: Pharmacist Clinician (PhC)/ Clinical Pharmacy Specialist

## 2023-05-02 ENCOUNTER — Encounter: Payer: Self-pay | Admitting: Cardiology

## 2023-05-02 DIAGNOSIS — E7849 Other hyperlipidemia: Secondary | ICD-10-CM

## 2023-05-02 LAB — HEPATIC FUNCTION PANEL
ALT: 23 IU/L (ref 0–44)
AST: 20 IU/L (ref 0–40)
Albumin: 4.3 g/dL (ref 3.9–4.9)
Alkaline Phosphatase: 75 IU/L (ref 44–121)
Bilirubin Total: 0.6 mg/dL (ref 0.0–1.2)
Bilirubin, Direct: 0.2 mg/dL (ref 0.00–0.40)
Total Protein: 6.4 g/dL (ref 6.0–8.5)

## 2023-05-02 LAB — LIPASE: Lipase: 27 U/L (ref 13–78)

## 2023-05-02 NOTE — Telephone Encounter (Signed)
Patient has question about his lab result.

## 2023-05-02 NOTE — Telephone Encounter (Signed)
MyChart message sent to patient with lab results  Only LFTs checked -- no lipids

## 2023-05-02 NOTE — Telephone Encounter (Signed)
Spoke with patient and he is aware of lab results. He states he know they are in normal range but wanted to know if the repatha is helping. I tried t explain the results to patient and kept saying he would like a call form [place] and the pharmacist

## 2023-05-03 NOTE — Telephone Encounter (Signed)
Olene Floss, RPH-CPP  to Lynetta Mare "Ron"      05/02/23  4:55 PM Mr. Hardie, Clamp is off today. Looks like there was some error and your cholesterol lab was not done. I have called [facility] to add on a lipid panel test. This should result in a few days. This test will let us know how the Repatha is working.   Efraim Kaufmann, PharmD  Last read by Lynetta Mare "Ron" at  7:45 PM on 05/02/2023.

## 2023-05-11 ENCOUNTER — Other Ambulatory Visit (HOSPITAL_COMMUNITY): Payer: Self-pay

## 2023-05-15 ENCOUNTER — Other Ambulatory Visit: Payer: Self-pay | Admitting: Cardiology

## 2023-05-15 DIAGNOSIS — I2510 Atherosclerotic heart disease of native coronary artery without angina pectoris: Secondary | ICD-10-CM

## 2023-05-15 DIAGNOSIS — I10 Essential (primary) hypertension: Secondary | ICD-10-CM

## 2023-05-16 DIAGNOSIS — E7849 Other hyperlipidemia: Secondary | ICD-10-CM | POA: Diagnosis not present

## 2023-05-16 LAB — LIPID PANEL
Chol/HDL Ratio: 2.1 ratio (ref 0.0–5.0)
Cholesterol, Total: 93 mg/dL — ABNORMAL LOW (ref 100–199)
HDL: 44 mg/dL (ref >39)
LDL Chol Calc (NIH): 28 mg/dL (ref 0–99)
Triglycerides: 118 mg/dL (ref 0–149)
VLDL Cholesterol Cal: 21 mg/dL (ref 5–40)

## 2023-06-01 ENCOUNTER — Other Ambulatory Visit: Payer: Self-pay | Admitting: Cardiology

## 2023-06-03 ENCOUNTER — Telehealth (HOSPITAL_COMMUNITY): Payer: Self-pay | Admitting: Cardiology

## 2023-06-03 ENCOUNTER — Other Ambulatory Visit: Payer: Self-pay | Admitting: *Deleted

## 2023-06-03 DIAGNOSIS — I350 Nonrheumatic aortic (valve) stenosis: Secondary | ICD-10-CM

## 2023-06-03 NOTE — Addendum Note (Signed)
Addended by: Freddi Starr on: 06/03/2023 04:24 PM   Modules accepted: Orders

## 2023-06-03 NOTE — Telephone Encounter (Signed)
Spoke with pt, echo and follow up office visit scheduled.

## 2023-06-03 NOTE — Telephone Encounter (Signed)
Patient was scheduled for yearly echo on 06/19/23. He called to schedule his follow up with Dr Jens Som and they could not get him in til march 2025 for appt. Patient cancelled the echocardiogram due ti the fact that he could not see him any time soon to go over echo results. He will reschedule when he can see MD in a timely a manner.

## 2023-06-10 NOTE — Progress Notes (Signed)
HPI: FU coronary artery disease. Had non-ST elevation myocardial infarction May 2022. Cardiac catheterization May 2022 showed occluded right coronary artery, 50% first diagonal, 30% second diagonal 25% circumflex; moderate aortic stenosis with mean gradient 30 mmHg. CTA March 2023 showed 75% stenosis of the right brachiocephalic artery origin, 40 to 16% left common carotid artery and ulcerated atherosclerotic plaque in the distal aortic arch.  Carotid Dopplers April 2024 showed 1 to 39% right and 40 to 59% left stenosis. Abdominal ultrasound May 2024 showed 4.4 cm abdominal aortic aneurysm.  Echocardiogram October 2024 showed normal LV function, grade 1 diastolic dysfunction, mild aortic stenosis with mean gradient 18 mmHg, trace aortic insufficiency and mildly dilated ascending aorta at 43 mm.  Since last seen, he has dyspnea with more vigorous activities but not routine activities.  No orthopnea, PND, pedal edema or chest pain.  No syncope.  Current Outpatient Medications  Medication Sig Dispense Refill   amLODipine (NORVASC) 5 MG tablet TAKE 1 TABLET BY MOUTH DAILY 100 tablet 2   aspirin 81 MG chewable tablet Chew 81 mg by mouth daily.     Evolocumab (REPATHA SURECLICK) 140 MG/ML SOAJ Inject 140 mg into the skin every 14 days. 6 mL 3   ezetimibe (ZETIA) 10 MG tablet TAKE 1 TABLET BY MOUTH DAILY 90 tablet 2   famotidine (PEPCID) 20 MG tablet Take 20 mg by mouth 2 (two) times daily.     labetalol (NORMODYNE) 100 MG tablet TAKE 1 TABLET BY MOUTH TWICE  DAILY 180 tablet 2   olmesartan (BENICAR) 40 MG tablet TAKE 1 TABLET BY MOUTH DAILY 90 tablet 2   furosemide (LASIX) 40 MG tablet Take 1 tablet (40 mg total) by mouth daily. 90 tablet 0   No current facility-administered medications for this visit.     Past Medical History:  Diagnosis Date   Arthritis    hands   BMI 34.0-34.9,adult    Coronary artery disease    Dyspnea 02/07/2021   Headache    HTN (hypertension)    Hyperlipidemia     Pneumonia 02/07/2021    Past Surgical History:  Procedure Laterality Date   CYST REMOVAL HAND Right 2018   gallbladder removed  2008   LEFT HEART CATH AND CORONARY ANGIOGRAPHY N/A 02/07/2021   Procedure: LEFT HEART CATH AND CORONARY ANGIOGRAPHY;  Surgeon: Swaziland, Peter M, MD;  Location: [facility] INVASIVE CV LAB;  Service: Cardiovascular;  Laterality: N/A;   SHOULDER ARTHROSCOPY WITH ROTATOR CUFF REPAIR Right 03/14/2021   Procedure: Right shoulder arthroscopy, subacromial decompression, distal clavicle resection, rotator cuff repair, bicep tenodesis;  Surgeon: Francena Hanly, MD;  Location: [facility] ORS;  Service: Orthopedics;  Laterality: Right;     Social History   Socioeconomic History   Marital status: Married    Spouse name: Not on file   Number of children: 2   Years of education: 12   Highest education level: Not on file  Occupational History   Not on file  Tobacco Use   Smoking status: Former    Current packs/day: 0.00    Average packs/day: 1.5 packs/day for 35.0 years (52.5 ttl pk-yrs)    Types: Cigarettes    Start date: 02/02/1986    Quit date: 02/02/2021    Years since quitting: 2.3   Smokeless tobacco: Never  Vaping Use   Vaping status: Never Used  Substance and Sexual Activity   Alcohol use: Yes    Comment: occasional   Drug use: Never   Sexual activity:  Not on file  Other Topics Concern   Not on file  Social History Narrative   Right handed   One story home   Drinks caffeine   Social Determinants of Health   Financial Resource Strain: Not on file  Food Insecurity: Not on file  Transportation Needs: Not on file  Physical Activity: Not on file  Stress: Not on file  Social Connections: Unknown (01/10/2022)   [facility] from [facility]   Social Network    Social Network: Not on file  Intimate Partner Violence: Unknown (12/13/2021)   [facility] from [facility]   HITS    Physically Hurt: Not on file    Insult or Talk Down To: Not  on file    Threaten Physical Harm: Not on file    Scream or Curse: Not on file    Family History  Family history unknown: Yes    ROS: no fevers or chills, productive cough, hemoptysis, dysphasia, odynophagia, melena, hematochezia, dysuria, hematuria, rash, seizure activity, orthopnea, PND, pedal edema, claudication. Remaining systems are negative.  Physical Exam: Well-developed well-nourished in no acute distress.  Skin is warm and dry.  HEENT is normal.  Neck is supple.  Chest is clear to auscultation with normal expansion.  Cardiovascular exam is regular rate and rhythm.  Abdominal exam nontender or distended. No masses palpated. Extremities show no edema. neuro grossly intact  EKG Interpretation Date/Time:  Monday June 24 2023 16:26:59 EDT Ventricular Rate:  52 PR Interval:  158 QRS Duration:  96 QT Interval:  424 QTC Calculation: 394 R Axis:   33  Text Interpretation: Sinus bradycardia Confirmed by Olga Millers ([id number]) on 06/24/2023 4:28:06 PM    A/P  1 coronary artery disease-patient denies chest pain.  Plan to continue medical therapy.  Will continue aspirin.  He is intolerant to statins.  2 hyperlipidemia-He is intolerant to statins.  Recent LDL 28.  Continue Repatha and discontinue Zetia.  Recheck lipids in 8 weeks.  Goal LDL less than 55.  3 aortic stenosis-mild to moderate on most recent echocardiogram.  Plan follow-up study October 2025.  He understands the symptoms to be aware of including worsening dyspnea, chest pain or syncope.  4 abdominal aortic aneurysm-now followed by vascular surgery.  5 hypertension-blood pressure borderline.  I have asked him to track this and we will increase medications if needed.  6 vascular disease-Per vascular surgery.  Olga Millers, MD

## 2023-06-19 ENCOUNTER — Other Ambulatory Visit (HOSPITAL_COMMUNITY): Payer: Medicare Other

## 2023-06-19 ENCOUNTER — Ambulatory Visit (HOSPITAL_COMMUNITY): Payer: Medicare Other | Attending: Cardiovascular Disease

## 2023-06-19 DIAGNOSIS — I350 Nonrheumatic aortic (valve) stenosis: Secondary | ICD-10-CM | POA: Diagnosis not present

## 2023-06-19 LAB — ECHOCARDIOGRAM COMPLETE
AR max vel: 1.15 cm2
AV Area VTI: 1.28 cm2
AV Area mean vel: 1.09 cm2
AV Mean grad: 18 mm[Hg]
AV Peak grad: 31.9 mm[Hg]
Ao pk vel: 2.83 m/s
Area-P 1/2: 1.91 cm2
S' Lateral: 2.6 cm

## 2023-06-24 ENCOUNTER — Ambulatory Visit: Payer: Medicare Other | Attending: Cardiology | Admitting: Cardiology

## 2023-06-24 ENCOUNTER — Encounter: Payer: Self-pay | Admitting: Cardiology

## 2023-06-24 VITALS — BP 138/84 | HR 52 | Ht 71.0 in | Wt 278.6 lb

## 2023-06-24 DIAGNOSIS — I10 Essential (primary) hypertension: Secondary | ICD-10-CM | POA: Diagnosis not present

## 2023-06-24 DIAGNOSIS — I350 Nonrheumatic aortic (valve) stenosis: Secondary | ICD-10-CM | POA: Diagnosis not present

## 2023-06-24 DIAGNOSIS — E7849 Other hyperlipidemia: Secondary | ICD-10-CM

## 2023-06-24 NOTE — Patient Instructions (Signed)
Medication Instructions:  Stop the ezetimibe  *If you need a refill on your cardiac medications before your next appointment, please call your pharmacy*   Lab Work: Your physician recommends that you return for lab work in: 8 weeks  If you have labs (blood work) drawn today and your tests are completely normal, you will receive your results only by: MyChart Message (if you have MyChart) OR A paper copy in the mail If you have any lab test that is abnormal or we need to change your treatment, we will call you to review the results.      Follow-Up: At [facility], you and your health needs are our priority.  As part of our continuing mission to provide you with exceptional heart care, we have created designated Provider Care Teams.  These Care Teams include your primary Cardiologist (physician) and Advanced Practice Providers (APPs -  Physician Assistants and Nurse Practitioners) who all work together to provide you with the care you need, when you need it.  We recommend signing up for the patient portal called "MyChart".  Sign up information is provided on this After Visit Summary.  MyChart is used to connect with patients for Virtual Visits (Telemedicine).  Patients are able to view lab/test results, encounter notes, upcoming appointments, etc.  Non-urgent messages can be sent to your provider as well.   To learn more about what you can do with MyChart, go to [email or web address].    Your next appointment:   6 month(s)  Provider:   Olga Millers, MD

## 2023-07-31 ENCOUNTER — Other Ambulatory Visit: Payer: Self-pay

## 2023-07-31 ENCOUNTER — Other Ambulatory Visit (HOSPITAL_COMMUNITY): Payer: Self-pay

## 2023-08-01 ENCOUNTER — Other Ambulatory Visit (HOSPITAL_COMMUNITY): Payer: Self-pay

## 2023-08-02 ENCOUNTER — Other Ambulatory Visit (HOSPITAL_COMMUNITY): Payer: Self-pay

## 2023-08-23 DIAGNOSIS — S62623A Displaced fracture of medial phalanx of left middle finger, initial encounter for closed fracture: Secondary | ICD-10-CM | POA: Diagnosis not present

## 2023-08-23 DIAGNOSIS — S6992XA Unspecified injury of left wrist, hand and finger(s), initial encounter: Secondary | ICD-10-CM | POA: Diagnosis not present

## 2023-08-24 DIAGNOSIS — S62613A Displaced fracture of proximal phalanx of left middle finger, initial encounter for closed fracture: Secondary | ICD-10-CM | POA: Diagnosis not present

## 2023-08-28 DIAGNOSIS — S62613A Displaced fracture of proximal phalanx of left middle finger, initial encounter for closed fracture: Secondary | ICD-10-CM | POA: Diagnosis not present

## 2023-08-28 DIAGNOSIS — M79645 Pain in left finger(s): Secondary | ICD-10-CM | POA: Diagnosis not present

## 2023-09-16 ENCOUNTER — Encounter: Payer: Self-pay | Admitting: *Deleted

## 2023-09-23 ENCOUNTER — Telehealth: Payer: Self-pay | Admitting: Cardiology

## 2023-09-23 NOTE — Telephone Encounter (Signed)
Patient wants a call back directly from [employer].

## 2023-09-24 NOTE — Telephone Encounter (Signed)
 Left message for pt to call on Thursday when I return to the office.

## 2023-09-27 ENCOUNTER — Telehealth: Payer: Self-pay | Admitting: Cardiology

## 2023-09-27 DIAGNOSIS — E7849 Other hyperlipidemia: Secondary | ICD-10-CM | POA: Diagnosis not present

## 2023-09-27 LAB — HEPATIC FUNCTION PANEL
ALT: 23 [IU]/L (ref 0–44)
AST: 21 [IU]/L (ref 0–40)
Albumin: 4.6 g/dL (ref 3.9–4.9)
Alkaline Phosphatase: 95 [IU]/L (ref 44–121)
Bilirubin Total: 0.7 mg/dL (ref 0.0–1.2)
Bilirubin, Direct: 0.26 mg/dL (ref 0.00–0.40)
Total Protein: 6.6 g/dL (ref 6.0–8.5)

## 2023-09-27 LAB — LIPID PANEL
Chol/HDL Ratio: 2.7 {ratio} (ref 0.0–5.0)
Cholesterol, Total: 126 mg/dL (ref 100–199)
HDL: 47 mg/dL (ref >39)
LDL Chol Calc (NIH): 58 mg/dL (ref 0–99)
Triglycerides: 114 mg/dL (ref 0–149)
VLDL Cholesterol Cal: 21 mg/dL (ref 5–40)

## 2023-09-27 NOTE — Telephone Encounter (Signed)
Spoke with pt, follow up appt schedule.

## 2023-09-27 NOTE — Telephone Encounter (Signed)
Pt returning call

## 2023-09-27 NOTE — Telephone Encounter (Signed)
Pt received letter in mail. Requesting to speak with nurse because he has questions. And at the bottom of the letter it says to reach out. 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

## 2023-09-27 NOTE — Telephone Encounter (Signed)
Left message for pt to call.

## 2023-09-30 ENCOUNTER — Telehealth: Payer: Self-pay | Admitting: Cardiology

## 2023-09-30 NOTE — Telephone Encounter (Signed)
Patient is returning call. Requesting call back around this time today or tomorrow.

## 2023-10-01 NOTE — Telephone Encounter (Signed)
Spoke with pt, aware of lab results and recommendations.

## 2023-10-23 DIAGNOSIS — S62613A Displaced fracture of proximal phalanx of left middle finger, initial encounter for closed fracture: Secondary | ICD-10-CM | POA: Diagnosis not present

## 2023-10-25 ENCOUNTER — Other Ambulatory Visit (HOSPITAL_COMMUNITY): Payer: Self-pay

## 2023-10-27 ENCOUNTER — Other Ambulatory Visit: Payer: Self-pay | Admitting: Cardiology

## 2023-10-27 DIAGNOSIS — I10 Essential (primary) hypertension: Secondary | ICD-10-CM

## 2023-10-28 ENCOUNTER — Other Ambulatory Visit (HOSPITAL_COMMUNITY): Payer: Self-pay

## 2023-10-28 ENCOUNTER — Telehealth: Payer: Self-pay | Admitting: Pharmacy Technician

## 2023-10-28 NOTE — Telephone Encounter (Signed)
 Pharmacy Patient Advocate Encounter  Received notification from [facility] that Prior Authorization for Repatha has been APPROVED from 10/28/23 to 09/09/24. Ran test claim, Copay is $302.00 - one month. This test claim was processed through [facility]- copay amounts may vary at other pharmacies due to pharmacy/plan contracts, or as the patient moves through the different stages of their insurance plan.   PA #/Case ID/Reference #: [id number]

## 2023-10-28 NOTE — Telephone Encounter (Signed)
 Pharmacy Patient Advocate Encounter   Received notification from CoverMyMeds that prior authorization for Repatha is required/requested.   Insurance verification completed.   The patient is insured through [facility] .   Per test claim: PA required; PA submitted to above mentioned insurance via CoverMyMeds Key/confirmation #/EOC [employer] Status is pending

## 2023-10-31 ENCOUNTER — Other Ambulatory Visit: Payer: Self-pay | Admitting: Cardiology

## 2023-10-31 DIAGNOSIS — I2510 Atherosclerotic heart disease of native coronary artery without angina pectoris: Secondary | ICD-10-CM

## 2023-12-01 ENCOUNTER — Other Ambulatory Visit: Payer: Self-pay | Admitting: Cardiology

## 2023-12-01 DIAGNOSIS — I10 Essential (primary) hypertension: Secondary | ICD-10-CM

## 2023-12-11 ENCOUNTER — Encounter: Payer: Self-pay | Admitting: Cardiology

## 2023-12-11 ENCOUNTER — Encounter: Payer: Self-pay | Admitting: Vascular Surgery

## 2023-12-11 NOTE — Telephone Encounter (Signed)
 Patient identification verified by 2 forms. Marilynn Rail, RN    Called and spoke to patient  Patient states:   -leg pain on going for few weeks   -also has some back pain   -he has some numbness no tingling   -has some pain when walking   -no discoloration   -concerned could be related to AAA  Patient scheduled for OV 4/4 at 1:55pm with NP [place]  Patient agrees, no further questions at this time

## 2023-12-12 ENCOUNTER — Telehealth: Payer: Self-pay

## 2023-12-12 NOTE — Telephone Encounter (Signed)
 Called pt to get more information on his pain. He was driving and asked to call us back in about thirty min. He has an appt booked today with cardiology for the pain/AAA concerns.

## 2023-12-12 NOTE — Telephone Encounter (Signed)
 Triage: -attempted to return call to pt r/t back pain.  No answer/LM -requested pt to call back for an update.  He has an upcoming yearly exam expected as well depending on signs/symptoms

## 2023-12-12 NOTE — Progress Notes (Unsigned)
 Cardiology Clinic Note   Patient Name: [name] Date of Encounter: 12/13/2023  Primary Care Provider:  Joycelyn Rua, MD Primary Cardiologist:  Olga Millers, MD  Patient Profile    [name] 68 year old male presents the clinic today for evaluation of his abdominal aortic aneurysm.  Past Medical History    Past Medical History:  Diagnosis Date   Arthritis    hands   BMI 34.0-34.9,adult    Coronary artery disease    Dyspnea 02/07/2021   Headache    HTN (hypertension)    Hyperlipidemia    Pneumonia 02/07/2021   Past Surgical History:  Procedure Laterality Date   CYST REMOVAL HAND Right 2018   gallbladder removed  2008   LEFT HEART CATH AND CORONARY ANGIOGRAPHY N/A 02/07/2021   Procedure: LEFT HEART CATH AND CORONARY ANGIOGRAPHY;  Surgeon: Swaziland, Peter M, MD;  Location: [facility] INVASIVE CV LAB;  Service: Cardiovascular;  Laterality: N/A;   SHOULDER ARTHROSCOPY WITH ROTATOR CUFF REPAIR Right 03/14/2021   Procedure: Right shoulder arthroscopy, subacromial decompression, distal clavicle resection, rotator cuff repair, bicep tenodesis;  Surgeon: Francena Hanly, MD;  Location: [facility] ORS;  Service: Orthopedics;  Laterality: Right;     Allergies  Allergies  Allergen Reactions   Lovastatin     PT CANNOT TAKE STATINS, SEVERE MYALGIAS    History of Present Illness    [name] has a PMH of essential hypertension, HLD, coronary artery disease, nonrheumatic aortic valve stenosis, and abdominal aortic aneurysm.  He underwent LHC in the setting of NSTEMI 5/22.  He was noted to have occluded RCA, 50% first diagonal, 30% second diagonal, 25% circumflex, and moderate aortic stenosis with a mean gradient of 30 mmHg.  He had CTA 3/23 which showed 75% stenosis of his right brachiocephalic artery, 60-45% left common carotid artery and ulcerated atherosclerotic plaque in the distal aortic arch.  He had carotid Dopplers 4/24 which showed 1-39% right and 40-59% left  stenosis.  He underwent abdominal ultrasound 5/24 which showed 4.4 cm abdominal aortic aneurysm.  Echocardiogram 10/24 showed normal LV function, G1 DD, mild aortic stenosis with a mean gradient of 18 mmHg and trace aortic insufficiency.  He was noted to have mildly dilated ascending aorta measuring 43 mm.  He was seen in follow-up by Dr. Jens Som 06/24/2023.  During that time he noted dyspnea with more intense physical activity.  He denied orthopnea PND and lower extremity swelling.  He denied chest pain.  He denied syncope.  He contacted the nurse triage line on 12/11/2023.  He reported leg pain that had been ongoing for a few weeks.  He was also noticing some back pain.  He reported numbness and tingling.  He had some pain when walking.  He denied discoloration.  He had concern for his AAA.  He was added to my schedule.  He presents to the clinic for evaluation and states he is having right low back pain.  He reports that he was working in his garage and turned.  At that time he felt sharp pain.  His pain is reproducible with palpation.  He also has noted left knee pain for the past several weeks.  He is noticing some left leg swelling as well.  His back pain appears to be musculoskeletal.  His knee pain appears to be related to osteoarthritis.  I reassured him.  I offered to repeat his CT angio chest aorta.   He wishes to defer testing at this time and follow-up  with vein and vascular surgery. He stated that he is feeling nervous about his AAA because it is time for him to have reevaluation.   He wishes to keep his follow-up appointment with Dr. Jens Som in 2 months.  His blood pressure today is well-controlled at 126/70.  His heart rate is 77.   Home Medications    Prior to Admission medications   Medication Sig Start Date End Date Taking? Authorizing Provider  amLODipine (NORVASC) 5 MG tablet TAKE 1 TABLET BY MOUTH DAILY 06/03/23   Lewayne Bunting, MD  aspirin 81 MG chewable tablet Chew 81 mg by  mouth daily.    [provider]  Evolocumab (REPATHA SURECLICK) 140 MG/ML SOAJ Inject 140 mg into the skin every 14 days. 02/26/23   Lewayne Bunting, MD  famotidine (PEPCID) 20 MG tablet Take 20 mg by mouth 2 (two) times daily. 12/28/19   [provider]  furosemide (LASIX) 40 MG tablet Take 1 tablet (40 mg total) by mouth daily. 05/17/21 06/07/22  Chrystie Nose, MD  labetalol (NORMODYNE) 100 MG tablet TAKE 1 TABLET BY MOUTH TWICE  DAILY 10/29/23   Lewayne Bunting, MD  olmesartan (BENICAR) 40 MG tablet TAKE 1 TABLET BY MOUTH DAILY 12/03/23   Lewayne Bunting, MD    Family History    Family History  Family history unknown: Yes   He indicated that his mother is deceased. He indicated that his father is deceased.  Social History    Social History   Socioeconomic History   Marital status: Married    Spouse name: Not on file   Number of children: 2   Years of education: 12   Highest education level: Not on file  Occupational History   Not on file  Tobacco Use   Smoking status: Former    Current packs/day: 0.00    Average packs/day: 1.5 packs/day for 35.0 years (52.5 ttl pk-yrs)    Types: Cigarettes    Start date: 02/02/1986    Quit date: 02/02/2021    Years since quitting: 2.8   Smokeless tobacco: Never  Vaping Use   Vaping status: Never Used  Substance and Sexual Activity   Alcohol use: Yes    Comment: occasional   Drug use: Never   Sexual activity: Not on file  Other Topics Concern   Not on file  Social History Narrative   Right handed   One story home   Drinks caffeine   Social Drivers of Health   Financial Resource Strain: Not on file  Food Insecurity: Not on file  Transportation Needs: Not on file  Physical Activity: Not on file  Stress: Not on file  Social Connections: Unknown (01/10/2022)   Received from [facility], [facility]   Social Network    Social Network: Not on file  Intimate Partner Violence: Unknown (12/13/2021)   Received  from [facility], [facility]   HITS    Physically Hurt: Not on file    Insult or Talk Down To: Not on file    Threaten Physical Harm: Not on file    Scream or Curse: Not on file     Review of Systems    General:  No chills, fever, night sweats or weight changes.  Cardiovascular:  No chest pain, dyspnea on exertion, edema, orthopnea, palpitations, paroxysmal nocturnal dyspnea. Dermatological: No rash, lesions/masses Respiratory: No cough, dyspnea Urologic: No hematuria, dysuria Abdominal:   No nausea, vomiting, diarrhea, bright red blood per rectum, melena, or  hematemesis Neurologic:  No visual changes, wkns, changes in mental status. All other systems reviewed and are otherwise negative except as noted above.  Physical Exam    VS:  BP 126/70 (BP Location: Left Arm, Patient Position: Sitting, Cuff Size: Large)   Pulse 75   Ht 5\' 11"  (1.803 m)   Wt 273 lb (123.8 kg)   SpO2 95%   BMI 38.08 kg/m  , BMI Body mass index is 38.08 kg/m. GEN: Well nourished, well developed, in no acute distress. HEENT: normal. Neck: Supple, no JVD, carotid bruits, or masses. Cardiac: RRR, no murmurs, rubs, or gallops. No clubbing, cyanosis, generalized left lower extremity nonpitting edema.  Radials/DP/PT 2+ and equal bilaterally.  Respiratory:  Respirations regular and unlabored, clear to auscultation bilaterally. GI: Soft, nontender, nondistended, BS + x 4. MS: no deformity or atrophy. Skin: warm and dry, no rash. Neuro:  Strength and sensation are intact. Psych: Normal affect.  Accessory Clinical Findings    Recent Labs: 09/27/2023: ALT 23   Recent Lipid Panel    Component Value Date/Time   CHOL 126 09/27/2023 0811   TRIG 114 09/27/2023 0811   HDL 47 09/27/2023 0811   CHOLHDL 2.7 09/27/2023 0811   LDLCALC 58 09/27/2023 0811         ECG personally reviewed by me today-none today.    Echocardiogram 06/19/2023  IMPRESSIONS     1. Left ventricular ejection fraction, by  estimation, is 60 to 65%. The  left ventricle has normal function. The left ventricle has no regional  wall motion abnormalities. Left ventricular diastolic parameters are  consistent with Grade I diastolic  dysfunction (impaired relaxation).   2. Right ventricular systolic function is normal. The right ventricular  size is normal. There is normal pulmonary artery systolic pressure. The  estimated right ventricular systolic pressure is 34.4 mmHg.   3. The mitral valve is degenerative. Trivial mitral valve regurgitation.  No evidence of mitral stenosis.   4. The aortic valve is abnormal. There is moderate calcification of the  aortic valve. Aortic valve regurgitation is trivial. Mild aortic valve  stenosis. Aortic valve area, by VTI measures 1.28 cm. Aortic valve mean  gradient measures 18.0 mmHg. Aortic  valve Vmax measures 2.82 m/s.   5. Aortic dilatation noted. There is mild dilatation of the ascending  aorta, measuring 43 mm.   Comparison(s): Prior images reviewed side by side. Aorta size may have  increased compared to prior when measured in a similar location. Consider  CTA aorta to confirm.   FINDINGS   Left Ventricle: Left ventricular ejection fraction, by estimation, is 60  to 65%. The left ventricle has normal function. The left ventricle has no  regional wall motion abnormalities. The left ventricular internal cavity  size was normal in size. There is   no left ventricular hypertrophy. Left ventricular diastolic parameters  are consistent with Grade I diastolic dysfunction (impaired relaxation).   Right Ventricle: The right ventricular size is normal. No increase in  right ventricular wall thickness. Right ventricular systolic function is  normal. There is normal pulmonary artery systolic pressure. The tricuspid  regurgitant velocity is 2.57 m/s, and   with an assumed right atrial pressure of 8 mmHg, the estimated right  ventricular systolic pressure is 34.4 mmHg.    Left Atrium: Left atrial size was normal in size.   Right Atrium: Right atrial size was normal in size.   Pericardium: There is no evidence of pericardial effusion.   Mitral Valve: The mitral  valve is degenerative in appearance. Mild to  moderate mitral annular calcification. Trivial mitral valve regurgitation.  No evidence of mitral valve stenosis.   Tricuspid Valve: The tricuspid valve is normal in structure. Tricuspid  valve regurgitation is trivial. No evidence of tricuspid stenosis.   Aortic Valve: The aortic valve is abnormal. There is moderate  calcification of the aortic valve. Aortic valve regurgitation is trivial.  Mild aortic stenosis is present. Aortic valve mean gradient measures 18.0  mmHg. Aortic valve peak gradient measures  31.9 mmHg. Aortic valve area, by VTI measures 1.28 cm.   Pulmonic Valve: The pulmonic valve was normal in structure. Pulmonic valve  regurgitation is trivial. No evidence of pulmonic stenosis.   Aorta: Aortic dilatation noted. There is mild dilatation of the ascending  aorta, measuring 43 mm.   Venous: The inferior vena cava was not well visualized.   IAS/Shunts: The interatrial septum was not well visualized.        Assessment & Plan   1.  Coronary artery disease-denies recent episodes of exertional chest pain and anginal type symptoms.  He is statin intolerant. Continue amlodipine, aspirin, olmesartan  Abdominal aortic aneurysm-reports low back discomfort. Appears to be related to MSK. Reproducible with palpation.  Is  followed by vascular surgery for his AAA.  Echocardiogram 06/19/2023 showed normal LVEF, G1 DD, moderate calcification of the aortic valve with trivial aortic valve regurgitation and moderate aortic valve stenosis.  Ascending aorta measured 43 mm. Wishes to defer CT angio chest aorta Follow-up with vascular surgery Reassured that his symptoms do not appear to be related to dissection.  Essential hypertension-BP  today 126/70. Maintain blood pressure log Continue amlodipine, olmesartan,  Hyperlipidemia-LDL 58.  He is statin intolerant. High-fiber diet Continue Repatha, aspirin  Lower extremity edema-does have some generalized lower extremity nonpitting edema on the left.  Appears to be related to orthopedic issues. May increase furosemide to 80 mg x 2 days then resume normal dosing. Elevate lower extremity when not active Low-sodium diet  Disposition: Follow-up with Dr. Jens Som or me in 4-6 months.   Thomasene Ripple. Latrise Bowland NP-C     12/13/2023, 3:05 PM [facility] [address] Office [phone] Fax [phone]    I spent 15 minutes examining this patient, reviewing medications, and using patient centered shared decision making involving their cardiac care.   I spent  20 minutes reviewing past medical history,  medications, and prior cardiac tests.

## 2023-12-13 ENCOUNTER — Ambulatory Visit: Attending: General Practice | Admitting: General Practice

## 2023-12-13 ENCOUNTER — Encounter: Payer: Self-pay | Admitting: General Practice

## 2023-12-13 VITALS — BP 126/70 | HR 75 | Ht 71.0 in | Wt 273.0 lb

## 2023-12-13 DIAGNOSIS — I10 Essential (primary) hypertension: Secondary | ICD-10-CM | POA: Diagnosis not present

## 2023-12-13 DIAGNOSIS — I714 Abdominal aortic aneurysm, without rupture, unspecified: Secondary | ICD-10-CM | POA: Diagnosis not present

## 2023-12-13 DIAGNOSIS — E785 Hyperlipidemia, unspecified: Secondary | ICD-10-CM

## 2023-12-13 DIAGNOSIS — I2510 Atherosclerotic heart disease of native coronary artery without angina pectoris: Secondary | ICD-10-CM

## 2023-12-13 NOTE — Patient Instructions (Signed)
 Medication Instructions:  The current medical regimen is effective;  continue present plan and medications as directed. Please refer to the Current Medication list given to you today.  *If you need a refill on your cardiac medications before your next appointment, please call your pharmacy*  Lab Work:    Testing/Procedures: NONE     NONE  Other Instructions CALL DR CAIN  Follow-Up: At [facility], you and your health needs are our priority.  As part of our continuing mission to provide you with exceptional heart care, our providers are all part of one team.  This team includes your primary Cardiologist (physician) and Advanced Practice Providers or APPs (Physician Assistants and Nurse Practitioners) who all work together to provide you with the care you need, when you need it.  Your next appointment:   2 month(s)  Provider:   Olga Millers, MD         1st Floor: - Lobby - Registration  - Pharmacy  - Lab - Cafe  2nd Floor: - PV Lab - Diagnostic Testing (echo, CT, nuclear med)  3rd Floor: - Vacant  4th Floor: - TCTS (cardiothoracic surgery) - AFib Clinic - Structural Heart Clinic - Vascular Surgery  - Vascular Ultrasound  5th Floor: - HeartCare Cardiology (general and EP) - Clinical Pharmacy for coumadin, hypertension, lipid, weight-loss medications, and med management appointments    Valet parking services will be available as well.

## 2023-12-16 ENCOUNTER — Other Ambulatory Visit: Payer: Self-pay

## 2023-12-16 DIAGNOSIS — I7143 Infrarenal abdominal aortic aneurysm, without rupture: Secondary | ICD-10-CM

## 2023-12-16 DIAGNOSIS — I6523 Occlusion and stenosis of bilateral carotid arteries: Secondary | ICD-10-CM

## 2023-12-18 ENCOUNTER — Encounter: Payer: Self-pay | Admitting: Pharmacist Clinician (PhC)/ Clinical Pharmacy Specialist

## 2023-12-18 ENCOUNTER — Ambulatory Visit (INDEPENDENT_AMBULATORY_CARE_PROVIDER_SITE_OTHER)
Admission: RE | Admit: 2023-12-18 | Discharge: 2023-12-18 | Disposition: A | Discharge status: Home / Self Care | Source: Ambulatory Visit | Attending: Vascular Surgery | Admitting: Vascular Surgery

## 2023-12-18 ENCOUNTER — Ambulatory Visit (HOSPITAL_COMMUNITY)
Admission: RE | Admit: 2023-12-18 | Discharge: 2023-12-18 | Disposition: A | Discharge status: Home / Self Care | Source: Ambulatory Visit | Attending: Vascular Surgery | Admitting: Vascular Surgery

## 2023-12-18 ENCOUNTER — Ambulatory Visit: Admitting: Vascular Surgery

## 2023-12-18 ENCOUNTER — Other Ambulatory Visit (HOSPITAL_COMMUNITY): Payer: Self-pay

## 2023-12-18 ENCOUNTER — Encounter: Payer: Self-pay | Admitting: Vascular Surgery

## 2023-12-18 VITALS — BP 124/82 | HR 55 | Temp 98.0°F | Ht 71.0 in | Wt 268.0 lb

## 2023-12-18 DIAGNOSIS — I7143 Infrarenal abdominal aortic aneurysm, without rupture: Secondary | ICD-10-CM | POA: Insufficient documentation

## 2023-12-18 DIAGNOSIS — I6523 Occlusion and stenosis of bilateral carotid arteries: Secondary | ICD-10-CM | POA: Diagnosis not present

## 2023-12-18 MED ORDER — REPATHA SURECLICK 140 MG/ML ~~LOC~~ SOAJ
140.0000 mg | SUBCUTANEOUS | 3 refills | Status: DC
  Filled 2023-12-18 – 2024-01-16 (×2): qty 6, 84d supply, fill #0
  Filled 2024-04-08: qty 6, 84d supply, fill #1

## 2023-12-18 NOTE — Progress Notes (Signed)
 Patient ID: [name], male   DOB: 07/28/1956, 68 y.o.   MRN: [id number]  Reason for Consult: Follow-up   Referred by Joycelyn Rua, MD  Subjective:     HPI:  [name] is a 68 y.o. male with a history of abdominal aortic aneurysm and carotid artery stenosis.  More recently he was switched from Zetia to Repatha and he is tolerating this well.  He states that he left knee pain that began about a week ago but is now resolving.  This was associated with swelling and he was worried about this being related to his aneurysm.  He has also had back pain radiating down the left leg as well.  He denies any new abdominal pain.  Past Medical History:  Diagnosis Date   Arthritis    hands   BMI 34.0-34.9,adult    Coronary artery disease    Dyspnea 02/07/2021   Headache    HTN (hypertension)    Hyperlipidemia    Pneumonia 02/07/2021   Family History  Family history unknown: Yes   Past Surgical History:  Procedure Laterality Date   CYST REMOVAL HAND Right 2018   gallbladder removed  2008   LEFT HEART CATH AND CORONARY ANGIOGRAPHY N/A 02/07/2021   Procedure: LEFT HEART CATH AND CORONARY ANGIOGRAPHY;  Surgeon: Swaziland, Peter M, MD;  Location: [facility] INVASIVE CV LAB;  Service: Cardiovascular;  Laterality: N/A;   SHOULDER ARTHROSCOPY WITH ROTATOR CUFF REPAIR Right 03/14/2021   Procedure: Right shoulder arthroscopy, subacromial decompression, distal clavicle resection, rotator cuff repair, bicep tenodesis;  Surgeon: Francena Hanly, MD;  Location: [facility] ORS;  Service: Orthopedics;  Laterality: Right;     Short Social History:  Social History   Tobacco Use   Smoking status: Former    Current packs/day: 0.00    Average packs/day: 1.5 packs/day for 35.0 years (52.5 ttl pk-yrs)    Types: Cigarettes    Start date: 02/02/1986    Quit date: 02/02/2021    Years since quitting: 2.8   Smokeless tobacco: Never  Substance Use Topics   Alcohol use: Yes    Comment: occasional     Allergies  Allergen Reactions   Lovastatin     PT CANNOT TAKE STATINS, SEVERE MYALGIAS    Current Outpatient Medications  Medication Sig Dispense Refill   amLODipine (NORVASC) 5 MG tablet TAKE 1 TABLET BY MOUTH DAILY 100 tablet 2   aspirin 81 MG chewable tablet Chew 81 mg by mouth daily.     Evolocumab (REPATHA SURECLICK) 140 MG/ML SOAJ Inject 140 mg into the skin every 14 days. 6 mL 3   famotidine (PEPCID) 20 MG tablet Take 20 mg by mouth 2 (two) times daily.     labetalol (NORMODYNE) 100 MG tablet TAKE 1 TABLET BY MOUTH TWICE  DAILY 200 tablet 2   olmesartan (BENICAR) 40 MG tablet TAKE 1 TABLET BY MOUTH DAILY 100 tablet 0   furosemide (LASIX) 40 MG tablet Take 1 tablet (40 mg total) by mouth daily. 90 tablet 0   No current facility-administered medications for this visit.    Review of Systems  Constitutional:  Constitutional negative. HENT: HENT negative.  Eyes: Eyes negative.  Respiratory: Respiratory negative.  Cardiovascular: Positive for leg swelling.  GI: Gastrointestinal negative.  Musculoskeletal: Positive for back pain and leg pain.  Hematologic: Hematologic/lymphatic negative.  Psychiatric: Psychiatric negative.        Objective:  Objective   Vitals:   12/18/23 0942 12/18/23 0944  BP: 127/80 124/82  Pulse: (!) 55   Temp: 98 F (36.7 C)   SpO2: 94%   Weight: 268 lb (121.6 kg)   Height: 5\' 11"  (1.803 m)    Body mass index is 37.38 kg/m.  Physical Exam Cardiovascular:     Rate and Rhythm: Normal rate.  Pulmonary:     Effort: Pulmonary effort is normal.  Musculoskeletal:     Right lower leg: Edema present.     Left lower leg: Edema present.  Skin:    General: Skin is warm.     Capillary Refill: Capillary refill takes less than 2 seconds.  Neurological:     General: No focal deficit present.     Mental Status: He is alert.     Data: Right Carotid Findings:  +----------+--------+--------+--------+------------------+           PSV  cm/sEDV cm/sStenosisPlaque Description  +----------+--------+--------+--------+------------------+  CCA Prox  148     30                                  +----------+--------+--------+--------+------------------+  CCA Distal45      22              heterogenous        +----------+--------+--------+--------+------------------+  ICA Prox  33      20      1-39%   heterogenous        +----------+--------+--------+--------+------------------+  ICA Mid   23      12                                  +----------+--------+--------+--------+------------------+  ICA Distal31      18                                  +----------+--------+--------+--------+------------------+  ECA      89      31                                  +----------+--------+--------+--------+------------------+   +----------+--------+-------+---------+           PSV cm/sEDV cmsDescribe   +----------+--------+-------+---------+  Subclavian84     0      Turbulent  +----------+--------+-------+---------+   +---------+--------+--+--------+-+---------------+  VertebralPSV cm/s50EDV cm/s6Bi- directional  +---------+--------+--+--------+-+---------------+     Left Carotid Findings:  +----------+--------+--------+--------+------------------+           PSV cm/sEDV cm/sStenosisPlaque Description  +----------+--------+--------+--------+------------------+  CCA Prox  101     28                                  +----------+--------+--------+--------+------------------+  CCA Distal71      21              heterogenous        +----------+--------+--------+--------+------------------+  ICA Prox  43      21      1-39%   heterogenous        +----------+--------+--------+--------+------------------+  ICA Mid   57      15                                  +----------+--------+--------+--------+------------------+  ICA Distal62      16                                   +----------+--------+--------+--------+------------------+  ECA      89      18                                  +----------+--------+--------+--------+------------------+   +----------+--------+--------+---------+           PSV cm/sEDV cm/sDescribe   +----------+--------+--------+---------+  Subclavian182            Turbulent  +----------+--------+--------+---------+   +---------+--------+--+--------+--+---------+  VertebralPSV cm/s42EDV cm/s16Antegrade  +---------+--------+--+--------+--+---------+       Summary:  Right Carotid: Velocities in the right ICA are consistent with a 1-39%  stenosis.                Non-hemodynamically significant plaque <50% noted in the  CCA. The                 ECA appears <50% stenosed.   Left Carotid: Velocities in the left ICA are consistent with a 1-39%  stenosis.               Non-hemodynamically significant plaque <50% noted in the  CCA. The                ECA appears <50% stenosed.   Vertebrals:  Left vertebral artery demonstrates antegrade flow. Right  vertebral              artery demonstrates bidirectional flow.  Subclavians: Bilateral subclavian artery flow was disturbed.   Abdominal Aorta Findings:  +-------------+-------+----------+----------+  Location    AP (cm)Trans (cm)PSV (cm/s)  +-------------+-------+----------+----------+  Proximal    2.57   2.76      33          +-------------+-------+----------+----------+  Mid         4.29   4.43      59          +-------------+-------+----------+----------+  Distal      2.71   2.54      22          +-------------+-------+----------+----------+  RT CIA Prox  1.2    1.2       136         +-------------+-------+----------+----------+  RT CIA Distal                 135         +-------------+-------+----------+----------+  RT EIA Prox                   161          +-------------+-------+----------+----------+  RT EIA Distal                 125         +-------------+-------+----------+----------+  LT CIA Prox  1.9    1.7       57          +-------------+-------+----------+----------+  LT CIA Distal                 60          +-------------+-------+----------+----------+  LT EIA Prox                   263         +-------------+-------+----------+----------+  LT EIA Distal                 134         +-------------+-------+----------+----------+     Summary: - Patent abdominal aorta with evidence of dilatation. The largest  aoritc measurement is 4.4 cm. Prior exam measurement (4.4 cm). The largest  aoritic measurement diameter remains essentially unchanged from prior exam  - Dilatation noted at the left common iliac artery (1.9 cm) Prior exam  measurement (1.9 cm).  - Elevated velocities at left distal external iliac artery suggestive of  >50% stenosis.       Assessment/Plan:    68 year old male with stable size aortic aneurysm and carotid stenosis.  I do not think his recent left knee pain is related and possibly is either an unknown injury versus ruptured Baker's cyst.  I have recommended gentle compression of the knee and elevation of his leg when he is resting.  He will follow-up in 1 year with repeat noninvasive studies.     Maeola Harman MD [facility]

## 2023-12-24 ENCOUNTER — Other Ambulatory Visit (HOSPITAL_COMMUNITY): Payer: Self-pay

## 2024-01-16 ENCOUNTER — Other Ambulatory Visit: Payer: Self-pay

## 2024-01-16 ENCOUNTER — Other Ambulatory Visit (HOSPITAL_COMMUNITY): Payer: Self-pay

## 2024-01-21 ENCOUNTER — Other Ambulatory Visit: Payer: Self-pay | Admitting: Cardiology

## 2024-01-21 DIAGNOSIS — I10 Essential (primary) hypertension: Secondary | ICD-10-CM

## 2024-01-22 MED ORDER — OLMESARTAN MEDOXOMIL 40 MG PO TABS
40.0000 mg | ORAL_TABLET | Freq: Every day | ORAL | 2 refills | Status: DC
Start: 2024-01-22 — End: 2024-07-01

## 2024-01-29 DIAGNOSIS — G720 Drug-induced myopathy: Secondary | ICD-10-CM | POA: Diagnosis not present

## 2024-01-29 DIAGNOSIS — I2510 Atherosclerotic heart disease of native coronary artery without angina pectoris: Secondary | ICD-10-CM | POA: Diagnosis not present

## 2024-01-29 DIAGNOSIS — I10 Essential (primary) hypertension: Secondary | ICD-10-CM | POA: Diagnosis not present

## 2024-01-29 DIAGNOSIS — K219 Gastro-esophageal reflux disease without esophagitis: Secondary | ICD-10-CM | POA: Diagnosis not present

## 2024-01-29 DIAGNOSIS — E782 Mixed hyperlipidemia: Secondary | ICD-10-CM | POA: Diagnosis not present

## 2024-01-29 DIAGNOSIS — I714 Abdominal aortic aneurysm, without rupture, unspecified: Secondary | ICD-10-CM | POA: Diagnosis not present

## 2024-01-29 DIAGNOSIS — Z0000 Encounter for general adult medical examination without abnormal findings: Secondary | ICD-10-CM | POA: Diagnosis not present

## 2024-01-31 NOTE — Progress Notes (Signed)
 HPI: FU coronary artery disease. Had non-ST elevation myocardial infarction May 2022. Cardiac catheterization May 2022 showed occluded right coronary artery, 50% first diagonal, 30% second diagonal 25% circumflex; moderate aortic stenosis with mean gradient 30 mmHg. CTA March 2023 showed 75% stenosis of the right brachiocephalic artery origin, 40 to 16% left common carotid artery and ulcerated atherosclerotic plaque in the distal aortic arch. Echocardiogram October 2024 showed normal LV function, grade 1 diastolic dysfunction, mild aortic stenosis with mean gradient 18 mmHg, trace aortic insufficiency and mildly dilated ascending aorta at 43 mm.  Abdominal ultrasound April 2025 showed 4.4 cm abdominal aortic aneurysm, 1.9 cm left common iliac artery.  Carotid Dopplers April 2025 showed 1 to 39% right and left stenosis.  Since last seen, the patient has dyspnea with more extreme activities but not with routine activities. It is relieved with rest. It is not associated with chest pain. There is no orthopnea, PND or pedal edema. There is no syncope or palpitations. There is no exertional chest pain.    Current Outpatient Medications  Medication Sig Dispense Refill   amLODipine  (NORVASC ) 5 MG tablet TAKE 1 TABLET BY MOUTH DAILY 100 tablet 2   aspirin  81 MG chewable tablet Chew 81 mg by mouth daily.     Evolocumab  (REPATHA  SURECLICK) 140 MG/ML SOAJ Inject 140 mg into the skin every 14 days. 6 mL 3   famotidine  (PEPCID ) 20 MG tablet Take 20 mg by mouth 2 (two) times daily.     furosemide  (LASIX ) 40 MG tablet Take 1 tablet (40 mg total) by mouth daily. 90 tablet 0   labetalol  (NORMODYNE ) 100 MG tablet TAKE 1 TABLET BY MOUTH TWICE  DAILY 200 tablet 2   olmesartan  (BENICAR ) 40 MG tablet Take 1 tablet (40 mg total) by mouth daily. 90 tablet 2   No current facility-administered medications for this visit.     Past Medical History:  Diagnosis Date   Arthritis    hands   BMI 34.0-34.9,adult     Coronary artery disease    Dyspnea 02/07/2021   Headache    HTN (hypertension)    Hyperlipidemia    Pneumonia 02/07/2021    Past Surgical History:  Procedure Laterality Date   CYST REMOVAL HAND Right 2018   gallbladder removed  2008   LEFT HEART CATH AND CORONARY ANGIOGRAPHY N/A 02/07/2021   Procedure: LEFT HEART CATH AND CORONARY ANGIOGRAPHY;  Surgeon: Swaziland, Peter M, MD;  Location: [facility] INVASIVE CV LAB;  Service: Cardiovascular;  Laterality: N/A;   SHOULDER ARTHROSCOPY WITH ROTATOR CUFF REPAIR Right 03/14/2021   Procedure: Right shoulder arthroscopy, subacromial decompression, distal clavicle resection, rotator cuff repair, bicep tenodesis;  Surgeon: Ellard Gunning, MD;  Location: [facility];  Service: Orthopedics;  Laterality: Right;     Social History   Socioeconomic History   Marital status: Married    Spouse name: Not on file   Number of children: 2   Years of education: 12   Highest education level: Not on file  Occupational History   Not on file  Tobacco Use   Smoking status: Former    Current packs/day: 0.00    Average packs/day: 1.5 packs/day for 35.0 years (52.5 ttl pk-yrs)    Types: Cigarettes    Start date: 02/02/1986    Quit date: 02/02/2021    Years since quitting: 3.0   Smokeless tobacco: Never  Vaping Use   Vaping status: Never Used  Substance and Sexual Activity   Alcohol use: Yes  Comment: occasional   Drug use: Never   Sexual activity: Not on file  Other Topics Concern   Not on file  Social History Narrative   Right handed   One story home   Drinks caffeine   Social Drivers of Health   Financial Resource Strain: Not on file  Food Insecurity: Not on file  Transportation Needs: Not on file  Physical Activity: Not on file  Stress: Not on file  Social Connections: Unknown (01/10/2022)   Received from [facility]   Social Network    Social Network: Not on file  Intimate Partner Violence: Unknown (12/13/2021)   Received from  [facility], [facility]   HITS    Physically Hurt: Not on file    Insult or Talk Down To: Not on file    Threaten Physical Harm: Not on file    Scream or Curse: Not on file    Family History  Family history unknown: Yes    ROS: no fevers or chills, productive cough, hemoptysis, dysphasia, odynophagia, melena, hematochezia, dysuria, hematuria, rash, seizure activity, orthopnea, PND, pedal edema, claudication. Remaining systems are negative.  Physical Exam: Well-developed well-nourished in no acute distress.  Skin is warm and dry.  HEENT is normal.  Neck is supple.  Chest is clear to auscultation with normal expansion.  Cardiovascular exam is regular rate and rhythm.  2/6 systolic murmur left sternal border.  S2 is not diminished. Abdominal exam nontender or distended. No masses palpated. Extremities show no edema. neuro grossly intact   A/P  1 coronary artery disease-patient doing well from a symptomatic standpoint.  Continue aspirin .  Intolerant to statins.  2 aortic stenosis-we will plan follow-up echocardiogram October 2025.  We discussed the symptoms to be aware of including dyspnea, chest pain and syncope.  3 hyperlipidemia-intolerant to statins.  Continue Repatha .  4 hypertension-patient's blood pressure is elevated; blood pressure is elevated today.  However he states controlled at home.  I have asked him to track this and we will advance amlodipine  to 10 mg daily if it stays elevated.  5 abdominal aortic aneurysm/vascular disease-followed by vascular surgery.  6 dilated thoracic aorta-plan follow-up echocardiogram October 2025.  Alexandria Angel, MD

## 2024-02-14 ENCOUNTER — Ambulatory Visit: Payer: Medicare Other | Attending: Cardiology | Admitting: Cardiology

## 2024-02-14 ENCOUNTER — Other Ambulatory Visit: Payer: Self-pay | Admitting: Cardiology

## 2024-02-14 ENCOUNTER — Encounter: Payer: Self-pay | Admitting: Cardiology

## 2024-02-14 VITALS — BP 148/82 | HR 68 | Ht 71.0 in | Wt 273.2 lb

## 2024-02-14 DIAGNOSIS — I10 Essential (primary) hypertension: Secondary | ICD-10-CM

## 2024-02-14 DIAGNOSIS — I350 Nonrheumatic aortic (valve) stenosis: Secondary | ICD-10-CM | POA: Diagnosis not present

## 2024-02-14 DIAGNOSIS — I714 Abdominal aortic aneurysm, without rupture, unspecified: Secondary | ICD-10-CM

## 2024-02-14 DIAGNOSIS — I2510 Atherosclerotic heart disease of native coronary artery without angina pectoris: Secondary | ICD-10-CM | POA: Diagnosis not present

## 2024-02-14 DIAGNOSIS — E7849 Other hyperlipidemia: Secondary | ICD-10-CM

## 2024-02-14 NOTE — Addendum Note (Signed)
 Addended by: Briani Maul W on: 02/14/2024 04:37 PM   Modules accepted: Orders

## 2024-02-14 NOTE — Patient Instructions (Signed)
   Testing/Procedures:  Your physician has requested that you have an echocardiogram. Echocardiography is a painless test that uses sound waves to create images of your heart. It provides your doctor with information about the size and shape of your heart and how well your heart's chambers and valves are working. This procedure takes approximately one hour. There are no restrictions for this procedure. Please do NOT wear cologne, perfume, aftershave, or lotions (deodorant is allowed). Please arrive 15 minutes prior to your appointment time.  Please note: We ask at that you not bring children with you during ultrasound (echo/ vascular) testing. Due to room size and safety concerns, children are not allowed in the ultrasound rooms during exams. Our front office staff cannot provide observation of children in our lobby area while testing is being conducted. An adult accompanying a patient to their appointment will only be allowed in the ultrasound room at the discretion of the ultrasound technician under special circumstances. We apologize for any inconvenience. [address]-SCHEDULE IN OCTOBER  Follow-Up: At [facility], you and your health needs are our priority.  As part of our continuing mission to provide you with exceptional heart care, our providers are all part of one team.  This team includes your primary Cardiologist (physician) and Advanced Practice Providers or APPs (Physician Assistants and Nurse Practitioners) who all work together to provide you with the care you need, when you need it.  Your next appointment:   6 month(s)  Provider:   Alexandria Angel, MD

## 2024-03-23 DIAGNOSIS — M25362 Other instability, left knee: Secondary | ICD-10-CM | POA: Diagnosis not present

## 2024-03-31 ENCOUNTER — Encounter: Payer: Self-pay | Admitting: Cardiology

## 2024-03-31 ENCOUNTER — Other Ambulatory Visit: Payer: Self-pay

## 2024-03-31 DIAGNOSIS — I10 Essential (primary) hypertension: Secondary | ICD-10-CM

## 2024-03-31 MED ORDER — LABETALOL HCL 100 MG PO TABS: 100.0000 mg | ORAL_TABLET | Freq: Two times a day (BID) | ORAL | 3 refills | Status: AC

## 2024-03-31 MED ORDER — FUROSEMIDE 40 MG PO TABS: 40.0000 mg | ORAL_TABLET | Freq: Every day | ORAL | 3 refills | Status: AC

## 2024-04-08 ENCOUNTER — Telehealth: Payer: Self-pay | Admitting: Cardiology

## 2024-04-08 ENCOUNTER — Other Ambulatory Visit (HOSPITAL_COMMUNITY): Payer: Self-pay

## 2024-04-08 DIAGNOSIS — I214 Non-ST elevation (NSTEMI) myocardial infarction: Secondary | ICD-10-CM

## 2024-04-08 DIAGNOSIS — E7849 Other hyperlipidemia: Secondary | ICD-10-CM

## 2024-04-08 NOTE — Telephone Encounter (Signed)
 Please re-enroll patient in [employer]well and add to [facility]

## 2024-04-08 NOTE — Telephone Encounter (Signed)
 Pt c/o medication issue:  1. Name of Medication: Evolocumab  (REPATHA  SURECLICK) 140 MG/ML SOAJ   2. How are you currently taking this medication (dosage and times per day)? N/A  3. Are you having a reaction (difficulty breathing--STAT)? No   4. What is your medication issue? Pt was told by pharmacy that he no longer has pt assistance for this medication

## 2024-04-09 ENCOUNTER — Telehealth: Payer: Self-pay

## 2024-04-09 ENCOUNTER — Other Ambulatory Visit (HOSPITAL_COMMUNITY): Payer: Self-pay

## 2024-04-09 DIAGNOSIS — M25562 Pain in left knee: Secondary | ICD-10-CM | POA: Diagnosis not present

## 2024-04-09 NOTE — Telephone Encounter (Signed)
 Patient Advocate Encounter   The patient was approved for a [employer] grant that will help cover the cost of REPATHA  Total amount awarded, $2,500.  Effective: 03/10/24 - 03/09/25   APW:[id number] ERW:[id number] Hmnle:[id number] PI:[id number]   Pharmacy provided with approval and processing information.   Ileana Lehmann, CPhT  Pharmacy Patient Advocate Specialist  Direct Number: [phone] Fax: [phone]

## 2024-04-09 NOTE — Telephone Encounter (Signed)
 [name] renewed and added to [facility].

## 2024-04-10 ENCOUNTER — Other Ambulatory Visit: Payer: Self-pay

## 2024-04-14 ENCOUNTER — Other Ambulatory Visit (HOSPITAL_COMMUNITY): Payer: Self-pay

## 2024-04-22 DIAGNOSIS — M6281 Muscle weakness (generalized): Secondary | ICD-10-CM | POA: Diagnosis not present

## 2024-04-22 DIAGNOSIS — M25562 Pain in left knee: Secondary | ICD-10-CM | POA: Diagnosis not present

## 2024-04-22 DIAGNOSIS — M25662 Stiffness of left knee, not elsewhere classified: Secondary | ICD-10-CM | POA: Diagnosis not present

## 2024-04-23 ENCOUNTER — Other Ambulatory Visit: Payer: Self-pay

## 2024-04-23 ENCOUNTER — Other Ambulatory Visit (HOSPITAL_COMMUNITY): Payer: Self-pay

## 2024-04-23 MED ORDER — REPATHA SURECLICK 140 MG/ML ~~LOC~~ SOAJ
140.0000 mg | SUBCUTANEOUS | 3 refills | Status: AC
  Filled 2024-04-23 – 2024-07-06 (×2): qty 6, 84d supply, fill #0
  Filled 2024-09-11: qty 6, 84d supply, fill #1

## 2024-05-07 DIAGNOSIS — M6281 Muscle weakness (generalized): Secondary | ICD-10-CM | POA: Diagnosis not present

## 2024-05-07 DIAGNOSIS — M25562 Pain in left knee: Secondary | ICD-10-CM | POA: Diagnosis not present

## 2024-05-07 DIAGNOSIS — M25662 Stiffness of left knee, not elsewhere classified: Secondary | ICD-10-CM | POA: Diagnosis not present

## 2024-05-14 DIAGNOSIS — M6281 Muscle weakness (generalized): Secondary | ICD-10-CM | POA: Diagnosis not present

## 2024-05-14 DIAGNOSIS — M25662 Stiffness of left knee, not elsewhere classified: Secondary | ICD-10-CM | POA: Diagnosis not present

## 2024-05-14 DIAGNOSIS — M25562 Pain in left knee: Secondary | ICD-10-CM | POA: Diagnosis not present

## 2024-05-18 DIAGNOSIS — M25562 Pain in left knee: Secondary | ICD-10-CM | POA: Diagnosis not present

## 2024-06-01 DIAGNOSIS — H5213 Myopia, bilateral: Secondary | ICD-10-CM | POA: Diagnosis not present

## 2024-06-05 DIAGNOSIS — M25562 Pain in left knee: Secondary | ICD-10-CM | POA: Diagnosis not present

## 2024-06-10 ENCOUNTER — Ambulatory Visit: Payer: Self-pay | Admitting: Cardiology

## 2024-06-10 ENCOUNTER — Ambulatory Visit (HOSPITAL_COMMUNITY)
Admission: RE | Admit: 2024-06-10 | Discharge: 2024-06-10 | Disposition: A | Discharge status: Home / Self Care | Source: Ambulatory Visit | Attending: Cardiology | Admitting: Cardiology

## 2024-06-10 DIAGNOSIS — I350 Nonrheumatic aortic (valve) stenosis: Secondary | ICD-10-CM | POA: Diagnosis not present

## 2024-06-10 DIAGNOSIS — I2510 Atherosclerotic heart disease of native coronary artery without angina pectoris: Secondary | ICD-10-CM | POA: Diagnosis not present

## 2024-06-10 LAB — ECHOCARDIOGRAM COMPLETE
AR max vel: 1.03 cm2
AV Area VTI: 1.15 cm2
AV Area mean vel: 1.05 cm2
AV Mean grad: 18 mmHg
AV Peak grad: 32.9 mmHg
Ao pk vel: 2.87 m/s
Area-P 1/2: 2.73 cm2
S' Lateral: 2.7 cm

## 2024-06-12 DIAGNOSIS — S83232D Complex tear of medial meniscus, current injury, left knee, subsequent encounter: Secondary | ICD-10-CM | POA: Diagnosis not present

## 2024-06-27 ENCOUNTER — Other Ambulatory Visit: Payer: Self-pay | Admitting: Cardiology

## 2024-06-27 DIAGNOSIS — I10 Essential (primary) hypertension: Secondary | ICD-10-CM

## 2024-07-06 ENCOUNTER — Other Ambulatory Visit: Payer: Self-pay

## 2024-07-06 ENCOUNTER — Other Ambulatory Visit (HOSPITAL_COMMUNITY): Payer: Self-pay

## 2024-09-04 ENCOUNTER — Other Ambulatory Visit: Payer: Self-pay

## 2024-09-04 ENCOUNTER — Encounter (HOSPITAL_BASED_OUTPATIENT_CLINIC_OR_DEPARTMENT_OTHER): Payer: Self-pay

## 2024-09-04 ENCOUNTER — Other Ambulatory Visit (HOSPITAL_BASED_OUTPATIENT_CLINIC_OR_DEPARTMENT_OTHER): Payer: Self-pay

## 2024-09-04 ENCOUNTER — Emergency Department (HOSPITAL_BASED_OUTPATIENT_CLINIC_OR_DEPARTMENT_OTHER): Admitting: Radiology

## 2024-09-04 ENCOUNTER — Emergency Department (HOSPITAL_BASED_OUTPATIENT_CLINIC_OR_DEPARTMENT_OTHER)
Admission: EM | Admit: 2024-09-04 | Discharge: 2024-09-04 | Disposition: A | Discharge status: Home / Self Care | Attending: Emergency Medicine | Admitting: Emergency Medicine

## 2024-09-04 DIAGNOSIS — I10 Essential (primary) hypertension: Secondary | ICD-10-CM | POA: Insufficient documentation

## 2024-09-04 DIAGNOSIS — R058 Other specified cough: Secondary | ICD-10-CM

## 2024-09-04 DIAGNOSIS — J101 Influenza due to other identified influenza virus with other respiratory manifestations: Secondary | ICD-10-CM | POA: Diagnosis not present

## 2024-09-04 DIAGNOSIS — Z7982 Long term (current) use of aspirin: Secondary | ICD-10-CM | POA: Insufficient documentation

## 2024-09-04 DIAGNOSIS — R509 Fever, unspecified: Secondary | ICD-10-CM

## 2024-09-04 DIAGNOSIS — R0602 Shortness of breath: Secondary | ICD-10-CM

## 2024-09-04 LAB — BASIC METABOLIC PANEL WITH GFR
Anion gap: 11 (ref 5–15)
BUN: 12 mg/dL (ref 8–23)
CO2: 22 mmol/L (ref 22–32)
Calcium: 10 mg/dL (ref 8.9–10.3)
Chloride: 104 mmol/L (ref 98–111)
Creatinine, Ser: 1.08 mg/dL (ref 0.61–1.24)
GFR, Estimated: >60 mL/min
Glucose, Bld: 124 mg/dL — ABNORMAL HIGH (ref 70–99)
Potassium: 4 mmol/L (ref 3.5–5.1)
Sodium: 137 mmol/L (ref 135–145)

## 2024-09-04 LAB — CBC
HCT: 43.2 % (ref 39.0–52.0)
Hemoglobin: 15 g/dL (ref 13.0–17.0)
MCH: 33.5 pg (ref 26.0–34.0)
MCHC: 34.7 g/dL (ref 30.0–36.0)
MCV: 96.4 fL (ref 80.0–100.0)
Platelets: 135 K/uL — ABNORMAL LOW (ref 150–400)
RBC: 4.48 MIL/uL (ref 4.22–5.81)
RDW: 12.9 % (ref 11.5–15.5)
WBC: 5.6 K/uL (ref 4.0–10.5)
nRBC: 0 % (ref 0.0–0.2)

## 2024-09-04 LAB — RESP PANEL BY RT-PCR (RSV, FLU A&B, COVID)  RVPGX2
Influenza A by PCR: POSITIVE — AB
Influenza B by PCR: NEGATIVE
Resp Syncytial Virus by PCR: NEGATIVE
SARS Coronavirus 2 by RT PCR: NEGATIVE

## 2024-09-04 LAB — TROPONIN T, HIGH SENSITIVITY: Troponin T High Sensitivity: 18 ng/L (ref 0–19)

## 2024-09-04 MED ORDER — ACETAMINOPHEN 325 MG PO TABS
650.0000 mg | ORAL_TABLET | Freq: Once | ORAL | Status: DC | PRN
  Filled 2024-09-04: qty 2

## 2024-09-04 MED ORDER — OSELTAMIVIR PHOSPHATE 75 MG PO CAPS
75.0000 mg | ORAL_CAPSULE | Freq: Two times a day (BID) | ORAL | 0 refills | Status: AC
  Filled 2024-09-04: qty 10, 5d supply, fill #0

## 2024-09-04 NOTE — Discharge Instructions (Signed)
 Your history, exam, workup today revealed you do indeed have the flu causing your upper respiratory symptoms and his cough and shortness of breath.  Your x-ray did not show pneumonia and the labs are otherwise similar and improved from prior.  You are flu a positive and as we discussed, we feel you could be a candidate to benefit from Tamiflu .  Please take this medication and rest and stay hydrated and treat fever at home.  If any symptoms change or worsen acutely, please return to the nearest emergency department.

## 2024-09-04 NOTE — ED Provider Notes (Signed)
 " [place] EMERGENCY DEPARTMENT AT [facility] Provider Note   CSN: [id number] Arrival date & time: 09/04/24  9258     Patient presents with: Shortness of Breath   [name] is a 68 y.o. male.   The history is provided by the patient, medical records and the spouse. No language interpreter was used.  Shortness of Breath Severity:  Moderate Onset quality:  Gradual Duration:  1 day Timing:  Constant Progression:  Waxing and waning Chronicity:  New Context: URI   Relieved by:  Nothing Worsened by:  Coughing Ineffective treatments:  None tried Associated symptoms: cough, fever and sputum production   Associated symptoms: no abdominal pain, no chest pain, no claudication, no headaches, no hemoptysis, no neck pain, no rash, no vomiting and no wheezing        Prior to Admission medications  Medication Sig Start Date End Date Taking? Authorizing Provider  amLODipine  (NORVASC ) 5 MG tablet TAKE 1 TABLET BY MOUTH DAILY 02/17/24   Pietro Redell RAMAN, MD  aspirin  81 MG chewable tablet Chew 81 mg by mouth daily.    [provider]  Evolocumab  (REPATHA  SURECLICK) 140 MG/ML SOAJ Inject 140 mg into the skin every 14 days. 04/23/24   Pietro Redell RAMAN, MD  famotidine  (PEPCID ) 20 MG tablet Take 20 mg by mouth 2 (two) times daily. 12/28/19   [provider]  furosemide  (LASIX ) 40 MG tablet Take 1 tablet (40 mg total) by mouth daily. 03/31/24   Pietro Redell RAMAN, MD  labetalol  (NORMODYNE ) 100 MG tablet Take 1 tablet (100 mg total) by mouth 2 (two) times daily. 03/31/24   Pietro Redell RAMAN, MD  olmesartan  (BENICAR ) 40 MG tablet TAKE 1 TABLET BY MOUTH DAILY 07/01/24   Pietro Redell RAMAN, MD    Allergies: Lovastatin     Review of Systems  Constitutional:  Positive for chills, fatigue and fever.  HENT:  Positive for congestion.   Eyes:  Negative for visual disturbance.  Respiratory:  Positive for cough, sputum production, chest tightness and shortness of breath.  Negative for hemoptysis, wheezing and stridor.   Cardiovascular:  Negative for chest pain, palpitations, claudication and leg swelling.  Gastrointestinal:  Negative for abdominal pain, constipation, diarrhea, nausea and vomiting.  Genitourinary:  Negative for dysuria and flank pain.  Musculoskeletal:  Negative for back pain, neck pain and neck stiffness.  Skin:  Negative for rash and wound.  Neurological:  Negative for weakness, light-headedness, numbness and headaches.  Psychiatric/Behavioral:  Negative for agitation and confusion.     Updated Vital Signs BP 120/85 (BP Location: Left Arm)   Pulse 96   Temp (!) 101.3 F (38.5 C) (Oral)   Resp 19   Ht 5' 11 (1.803 m)   Wt 121.6 kg   SpO2 92%   BMI 37.38 kg/m   Physical Exam Vitals and nursing note reviewed.  Constitutional:      General: He is not in acute distress.    Appearance: He is well-developed. He is not ill-appearing, toxic-appearing or diaphoretic.  HENT:     Head: Normocephalic and atraumatic.  Eyes:     Conjunctiva/sclera: Conjunctivae normal.  Cardiovascular:     Rate and Rhythm: Normal rate and regular rhythm.     Heart sounds: No murmur heard. Pulmonary:     Effort: Pulmonary effort is normal. No respiratory distress.     Breath sounds: Normal breath sounds. No decreased breath sounds, wheezing, rhonchi or rales.  Chest:     Chest wall: No  tenderness.  Abdominal:     Palpations: Abdomen is soft.     Tenderness: There is no abdominal tenderness.  Musculoskeletal:        General: No swelling.     Cervical back: Neck supple.     Right lower leg: No edema.     Left lower leg: No edema.  Skin:    General: Skin is warm and dry.     Capillary Refill: Capillary refill takes less than 2 seconds.     Findings: No erythema.  Neurological:     General: No focal deficit present.     Mental Status: He is alert.  Psychiatric:        Mood and Affect: Mood normal.     (all labs ordered are listed, but only  abnormal results are displayed) Labs Reviewed  RESP PANEL BY RT-PCR (RSV, FLU A&B, COVID)  RVPGX2 - Abnormal; Notable for the following components:      Result Value   Influenza A by PCR POSITIVE (*)    All other components within normal limits  BASIC METABOLIC PANEL WITH GFR - Abnormal; Notable for the following components:   Glucose, Bld 124 (*)    All other components within normal limits  CBC - Abnormal; Notable for the following components:   Platelets 135 (*)    All other components within normal limits  TROPONIN T, HIGH SENSITIVITY    EKG: EKG Interpretation Date/Time:  Friday September 04 2024 07:49:35 EST Ventricular Rate:  98 PR Interval:  149 QRS Duration:  88 QT Interval:  328 QTC Calculation: 419 R Axis:   61  Text Interpretation: Sinus rhythm when compared to prior, faster rate and more artifact No STEMI Confirmed by Ginger Barefoot ([id number]) on 09/04/2024 7:51:52 AM  Radiology: DG Chest 2 View Result Date: 09/04/2024 EXAM: 2 VIEW(S) XRAY OF THE CHEST 09/04/2024 08:12:00 AM COMPARISON: Chest radiographs 09/07/2022 and earlier. CLINICAL HISTORY: 68 year old male with shortness of breath and chest pain yesterday. FINDINGS: LUNGS AND PLEURA: No focal pulmonary opacity. No pleural effusion. No pneumothorax. HEART AND MEDIASTINUM: No acute abnormality of the cardiac and mediastinal silhouettes. BONES AND SOFT TISSUES: Thoracic degenerative changes. IMPRESSION: 1. No acute cardiopulmonary abnormality. Electronically signed by: Helayne Hurst MD 09/04/2024 08:27 AM EST RP Workstation: [id number]     Procedures   Medications Ordered in the ED  acetaminophen  (TYLENOL ) tablet 650 mg (has no administration in time range)                                    Medical Decision Making Amount and/or Complexity of Data Reviewed Labs: ordered. Radiology: ordered.  Risk OTC drugs.    [name] is a 68 y.o. male with a past medical history significant for hypertension,  hyperlipidemia, previous NSTEMI, previous bronchitis, previous pneumonia who presents with 24 hours of fevers, chills, congestion, cough, and shortness of breath.  According to patient, had some chest tightness but more shortness of breath and productive cough.  He has had fever earlier today in triage with a temp of 101.3 on arrival.  He is denying nausea, vomiting, constipation, diarrhea, or urinary changes.  He denies any trauma.  He reports no sick contacts to his knowledge.  Is denying any other pain anywhere.  He is resting comfortably during my exam and oxygen saturations are in the upper 90s.  On arrival oxygen saturations were in the low 90s.  On  my exam, lungs were clear without rales rhonchi or wheezing.  Chest was nontender.  Abdomen nontender.  He has some congestion on oropharyngeal exam but no other concerning findings such as PTA or RPA.  No stridor.  Patient otherwise well-appearing.  X-ray in triage did not show pneumonia and is otherwise clear.  His workup in triage did reveal he has influenza A that fits with his symptoms.  His labs otherwise did not show leukocytosis and he has normal kidney function.  Patient otherwise had reassuring labs.  We had a shared decision-making conversation and agreed to do amatory pulse oximetry is to see if he gets hypoxic.  If he does not get hypoxic and is breathing better, they would like to try going home.  Anticipate prescription for Tamiflu  as his symptoms began yesterday and he is not having other GI symptoms.    If he gets hypoxic with the difficulty breathing, he may require admission.  Anticipate reassessment after ambulatory pulse ox.         9:15 AM Amatory pulse ox remained in the 90s and he was feeling better.  He did not want to be admitted.  Patient and family do want prescription for Tamiflu  and they will follow-up with her primary doctor.  They will observe strict return precautions for any new or worsening symptoms and still want to  go home.  Will discharge for outpatient management of the flu with good return precautions understood.       Final diagnoses:  Influenza A  Shortness of breath  Fever, unspecified fever cause  Productive cough    ED Discharge Orders          Ordered    oseltamivir  (TAMIFLU ) 75 MG capsule  Every 12 hours        09/04/24 0917           Clinical Impression: 1. Influenza A   2. Shortness of breath   3. Fever, unspecified fever cause   4. Productive cough     Disposition: Discharge  Condition: Good  I have discussed the results, Dx and Tx plan with the pt(& family if present). He/she/they expressed understanding and agree(s) with the plan. Discharge instructions discussed at great length. Strict return precautions discussed and pt &/or family have verbalized understanding of the instructions. No further questions at time of discharge.    New Prescriptions   OSELTAMIVIR  (TAMIFLU ) 75 MG CAPSULE    Take 1 capsule (75 mg total) by mouth every 12 (twelve) hours.    Follow Up: Nanci Senior, MD [address] [place] [address] [phone]     [facility] Emergency Department at [facility] [address] [place]  [address] [phone]        Vendela Troung, Lonni PARAS, MD 09/04/24 [phone]  "

## 2024-09-04 NOTE — ED Notes (Signed)
 Patient maintained oxygen saturation of 92% or greater while ambulating in hallway.

## 2024-09-04 NOTE — ED Triage Notes (Signed)
 Pt reports SOB starting yesterday. Pt chest pain as well. Pt has fever in triage.

## 2024-09-11 ENCOUNTER — Other Ambulatory Visit (HOSPITAL_COMMUNITY): Payer: Self-pay
